# Patient Record
Sex: Female | Born: 1950
Health system: Southern US, Community
[De-identification: ages and names within clinical notes are randomized; demographics above are authoritative.]

## PROBLEM LIST (undated history)

## (undated) DIAGNOSIS — E785 Hyperlipidemia, unspecified: Secondary | ICD-10-CM

## (undated) DIAGNOSIS — I10 Essential (primary) hypertension: Secondary | ICD-10-CM

## (undated) DIAGNOSIS — R011 Cardiac murmur, unspecified: Secondary | ICD-10-CM

## (undated) DIAGNOSIS — I6330 Cerebral infarction due to thrombosis of unspecified cerebral artery: Secondary | ICD-10-CM

## (undated) DIAGNOSIS — H269 Unspecified cataract: Secondary | ICD-10-CM

## (undated) DIAGNOSIS — S86019A Strain of unspecified Achilles tendon, initial encounter: Secondary | ICD-10-CM

## (undated) DIAGNOSIS — T7840XA Allergy, unspecified, initial encounter: Secondary | ICD-10-CM

## (undated) DIAGNOSIS — M1990 Unspecified osteoarthritis, unspecified site: Secondary | ICD-10-CM

## (undated) HISTORY — DX: Unspecified osteoarthritis, unspecified site: M19.90

## (undated) HISTORY — DX: Allergy, unspecified, initial encounter: T78.40XA

## (undated) HISTORY — DX: Unspecified cataract: H26.9

## (undated) HISTORY — PX: EYE SURGERY: SHX253

## (undated) HISTORY — PX: DILATION AND CURETTAGE OF UTERUS: SHX78

## (undated) HISTORY — DX: Strain of unspecified achilles tendon, initial encounter: S86.019A

## (undated) HISTORY — DX: Essential (primary) hypertension: I10

## (undated) HISTORY — DX: Cerebral infarction due to thrombosis of unspecified cerebral artery: I63.30

## (undated) HISTORY — DX: Cardiac murmur, unspecified: R01.1

## (undated) HISTORY — PX: CATARACT EXTRACTION, BILATERAL: SHX1313

## (undated) HISTORY — DX: Hyperlipidemia, unspecified: E78.5

---

## 1981-03-19 HISTORY — PX: OTHER SURGICAL HISTORY: SHX169

## 2000-03-19 DIAGNOSIS — I6330 Cerebral infarction due to thrombosis of unspecified cerebral artery: Secondary | ICD-10-CM

## 2000-03-19 HISTORY — DX: Cerebral infarction due to thrombosis of unspecified cerebral artery: I63.30

## 2000-10-05 ENCOUNTER — Inpatient Hospital Stay (HOSPITAL_COMMUNITY): Admission: EM | Admit: 2000-10-05 | Discharge: 2000-10-07 | Payer: Self-pay | Admitting: Emergency Medicine

## 2000-10-05 ENCOUNTER — Encounter: Payer: Self-pay | Admitting: Emergency Medicine

## 2000-10-14 ENCOUNTER — Encounter: Admission: RE | Admit: 2000-10-14 | Discharge: 2000-10-16 | Payer: Self-pay | Admitting: Internal Medicine

## 2001-03-19 HISTORY — PX: POLYPECTOMY: SHX149

## 2001-09-23 ENCOUNTER — Other Ambulatory Visit: Admission: RE | Admit: 2001-09-23 | Discharge: 2001-09-23 | Payer: Self-pay | Admitting: *Deleted

## 2002-09-30 ENCOUNTER — Other Ambulatory Visit: Admission: RE | Admit: 2002-09-30 | Discharge: 2002-09-30 | Payer: Self-pay | Admitting: *Deleted

## 2002-10-26 ENCOUNTER — Encounter: Admission: RE | Admit: 2002-10-26 | Discharge: 2002-10-26 | Payer: Self-pay | Admitting: Internal Medicine

## 2002-10-26 ENCOUNTER — Encounter: Payer: Self-pay | Admitting: Internal Medicine

## 2002-12-07 ENCOUNTER — Other Ambulatory Visit: Admission: RE | Admit: 2002-12-07 | Discharge: 2002-12-07 | Payer: Self-pay | Admitting: *Deleted

## 2003-05-03 ENCOUNTER — Other Ambulatory Visit: Admission: RE | Admit: 2003-05-03 | Discharge: 2003-05-03 | Payer: Self-pay | Admitting: *Deleted

## 2003-07-26 ENCOUNTER — Other Ambulatory Visit: Admission: RE | Admit: 2003-07-26 | Discharge: 2003-07-26 | Payer: Self-pay | Admitting: *Deleted

## 2003-08-05 ENCOUNTER — Encounter: Payer: Self-pay | Admitting: Internal Medicine

## 2003-09-20 ENCOUNTER — Encounter (INDEPENDENT_AMBULATORY_CARE_PROVIDER_SITE_OTHER): Payer: Self-pay | Admitting: *Deleted

## 2003-09-20 LAB — CONVERTED CEMR LAB

## 2003-09-22 ENCOUNTER — Other Ambulatory Visit: Admission: RE | Admit: 2003-09-22 | Discharge: 2003-09-22 | Payer: Self-pay | Admitting: *Deleted

## 2004-01-28 ENCOUNTER — Ambulatory Visit: Payer: Self-pay | Admitting: Internal Medicine

## 2004-04-06 ENCOUNTER — Other Ambulatory Visit: Admission: RE | Admit: 2004-04-06 | Discharge: 2004-04-06 | Payer: Self-pay | Admitting: *Deleted

## 2004-06-19 ENCOUNTER — Ambulatory Visit: Payer: Self-pay | Admitting: Internal Medicine

## 2004-07-01 ENCOUNTER — Ambulatory Visit: Payer: Self-pay | Admitting: Family Medicine

## 2004-09-21 ENCOUNTER — Other Ambulatory Visit: Admission: RE | Admit: 2004-09-21 | Discharge: 2004-09-21 | Payer: Self-pay | Admitting: *Deleted

## 2004-09-26 ENCOUNTER — Ambulatory Visit: Payer: Self-pay | Admitting: Internal Medicine

## 2004-10-02 ENCOUNTER — Ambulatory Visit: Payer: Self-pay | Admitting: Internal Medicine

## 2004-11-09 ENCOUNTER — Ambulatory Visit: Payer: Self-pay | Admitting: Internal Medicine

## 2005-01-08 ENCOUNTER — Ambulatory Visit: Payer: Self-pay | Admitting: Internal Medicine

## 2005-01-22 ENCOUNTER — Other Ambulatory Visit: Admission: RE | Admit: 2005-01-22 | Discharge: 2005-01-22 | Payer: Self-pay | Admitting: *Deleted

## 2005-01-30 ENCOUNTER — Ambulatory Visit: Payer: Self-pay | Admitting: Internal Medicine

## 2005-02-12 ENCOUNTER — Ambulatory Visit: Payer: Self-pay | Admitting: Internal Medicine

## 2005-06-04 ENCOUNTER — Ambulatory Visit: Payer: Self-pay | Admitting: Internal Medicine

## 2005-06-13 ENCOUNTER — Ambulatory Visit: Payer: Self-pay | Admitting: Internal Medicine

## 2005-08-02 ENCOUNTER — Ambulatory Visit: Payer: Self-pay | Admitting: Internal Medicine

## 2005-09-24 ENCOUNTER — Other Ambulatory Visit: Admission: RE | Admit: 2005-09-24 | Discharge: 2005-09-24 | Payer: Self-pay | Admitting: *Deleted

## 2005-10-22 ENCOUNTER — Ambulatory Visit: Payer: Self-pay | Admitting: Family Medicine

## 2005-11-06 ENCOUNTER — Ambulatory Visit: Payer: Self-pay | Admitting: Internal Medicine

## 2005-12-11 ENCOUNTER — Ambulatory Visit: Payer: Self-pay | Admitting: Internal Medicine

## 2006-03-19 HISTORY — PX: KNEE ARTHROSCOPY W/ MENISCAL REPAIR: SHX1877

## 2006-03-19 HISTORY — PX: COLONOSCOPY: SHX174

## 2006-03-29 ENCOUNTER — Ambulatory Visit: Payer: Self-pay | Admitting: Internal Medicine

## 2006-03-29 LAB — CONVERTED CEMR LAB
ALT: 29 units/L (ref 0–40)
AST: 24 units/L (ref 0–37)
Albumin: 4 g/dL (ref 3.5–5.2)
Alkaline Phosphatase: 54 units/L (ref 39–117)
BUN: 29 mg/dL — ABNORMAL HIGH (ref 6–23)
Basophils Absolute: 0 10*3/uL (ref 0.0–0.1)
Basophils Relative: 0.8 % (ref 0.0–1.0)
CO2: 30 meq/L (ref 19–32)
Calcium: 9.5 mg/dL (ref 8.4–10.5)
Chloride: 104 meq/L (ref 96–112)
Cholesterol: 183 mg/dL (ref 0–200)
Creatinine, Ser: 1.1 mg/dL (ref 0.4–1.2)
Eosinophils Relative: 4.4 % (ref 0.0–5.0)
GFR calc Af Amer: 66 mL/min
GFR calc non Af Amer: 55 mL/min
Glucose, Bld: 146 mg/dL — ABNORMAL HIGH (ref 70–99)
HCT: 41.6 % (ref 36.0–46.0)
HDL: 84.3 mg/dL (ref >39.0)
Hemoglobin: 14 g/dL (ref 12.0–15.0)
Hgb A1c MFr Bld: 5.9 % (ref 4.6–6.0)
LDL Cholesterol: 90 mg/dL (ref 0–99)
Lymphocytes Relative: 21.8 % (ref 12.0–46.0)
MCHC: 33.6 g/dL (ref 30.0–36.0)
MCV: 93.9 fL (ref 78.0–100.0)
Monocytes Absolute: 0.3 10*3/uL (ref 0.2–0.7)
Monocytes Relative: 6.6 % (ref 3.0–11.0)
Neutro Abs: 2.9 10*3/uL (ref 1.4–7.7)
Neutrophils Relative %: 66.4 % (ref 43.0–77.0)
Platelets: 236 10*3/uL (ref 150–400)
Potassium: 4 meq/L (ref 3.5–5.1)
RBC: 4.43 M/uL (ref 3.87–5.11)
RDW: 11.8 % (ref 11.5–14.6)
Sodium: 140 meq/L (ref 135–145)
TSH: 1.26 microintl units/mL (ref 0.35–5.50)
Total Bilirubin: 0.8 mg/dL (ref 0.3–1.2)
Total CHOL/HDL Ratio: 2.2
Total Protein: 6.8 g/dL (ref 6.0–8.3)
Triglycerides: 43 mg/dL (ref 0–149)
VLDL: 9 mg/dL (ref 0–40)
Vit D, 1,25-Dihydroxy: 36 (ref 20–57)
WBC: 4.4 10*3/uL — ABNORMAL LOW (ref 4.5–10.5)

## 2006-04-10 ENCOUNTER — Ambulatory Visit: Payer: Self-pay | Admitting: Internal Medicine

## 2006-04-10 LAB — CONVERTED CEMR LAB: Microalb, Ur: 0.64 mg/dL (ref 0.00–1.89)

## 2006-04-26 ENCOUNTER — Ambulatory Visit: Payer: Self-pay | Admitting: Internal Medicine

## 2006-05-13 ENCOUNTER — Ambulatory Visit: Payer: Self-pay | Admitting: Internal Medicine

## 2006-07-05 ENCOUNTER — Ambulatory Visit: Payer: Self-pay | Admitting: Internal Medicine

## 2006-07-18 ENCOUNTER — Ambulatory Visit: Payer: Self-pay | Admitting: Internal Medicine

## 2006-07-18 LAB — CONVERTED CEMR LAB: Hgb A1c MFr Bld: 5.7 % (ref 4.6–6.0)

## 2006-10-01 ENCOUNTER — Other Ambulatory Visit: Admission: RE | Admit: 2006-10-01 | Discharge: 2006-10-01 | Payer: Self-pay | Admitting: *Deleted

## 2006-10-17 ENCOUNTER — Telehealth: Payer: Self-pay | Admitting: Internal Medicine

## 2006-10-18 ENCOUNTER — Ambulatory Visit: Payer: Self-pay | Admitting: Internal Medicine

## 2006-10-18 LAB — CONVERTED CEMR LAB: Hgb A1c MFr Bld: 5.9 % (ref 4.6–6.0)

## 2006-12-12 ENCOUNTER — Ambulatory Visit: Payer: Self-pay | Admitting: Internal Medicine

## 2006-12-27 ENCOUNTER — Ambulatory Visit: Payer: Self-pay | Admitting: Internal Medicine

## 2007-01-14 ENCOUNTER — Telehealth (INDEPENDENT_AMBULATORY_CARE_PROVIDER_SITE_OTHER): Payer: Self-pay | Admitting: *Deleted

## 2007-01-15 ENCOUNTER — Ambulatory Visit: Payer: Self-pay | Admitting: Internal Medicine

## 2007-01-23 ENCOUNTER — Telehealth (INDEPENDENT_AMBULATORY_CARE_PROVIDER_SITE_OTHER): Payer: Self-pay | Admitting: *Deleted

## 2007-01-23 DIAGNOSIS — E1139 Type 2 diabetes mellitus with other diabetic ophthalmic complication: Secondary | ICD-10-CM | POA: Insufficient documentation

## 2007-01-23 DIAGNOSIS — E11319 Type 2 diabetes mellitus with unspecified diabetic retinopathy without macular edema: Secondary | ICD-10-CM

## 2007-01-27 ENCOUNTER — Encounter: Payer: Self-pay | Admitting: Internal Medicine

## 2007-01-30 ENCOUNTER — Ambulatory Visit: Payer: Self-pay | Admitting: Internal Medicine

## 2007-01-30 LAB — CONVERTED CEMR LAB: Hgb A1c MFr Bld: 5.8 % (ref 4.6–6.0)

## 2007-02-25 ENCOUNTER — Telehealth (INDEPENDENT_AMBULATORY_CARE_PROVIDER_SITE_OTHER): Payer: Self-pay | Admitting: *Deleted

## 2007-03-21 ENCOUNTER — Telehealth (INDEPENDENT_AMBULATORY_CARE_PROVIDER_SITE_OTHER): Payer: Self-pay | Admitting: *Deleted

## 2007-04-17 ENCOUNTER — Telehealth (INDEPENDENT_AMBULATORY_CARE_PROVIDER_SITE_OTHER): Payer: Self-pay | Admitting: *Deleted

## 2007-04-21 ENCOUNTER — Encounter (INDEPENDENT_AMBULATORY_CARE_PROVIDER_SITE_OTHER): Payer: Self-pay | Admitting: *Deleted

## 2007-04-21 DIAGNOSIS — Z8742 Personal history of other diseases of the female genital tract: Secondary | ICD-10-CM | POA: Insufficient documentation

## 2007-04-21 DIAGNOSIS — I10 Essential (primary) hypertension: Secondary | ICD-10-CM

## 2007-04-23 ENCOUNTER — Telehealth (INDEPENDENT_AMBULATORY_CARE_PROVIDER_SITE_OTHER): Payer: Self-pay | Admitting: *Deleted

## 2007-05-05 ENCOUNTER — Telehealth (INDEPENDENT_AMBULATORY_CARE_PROVIDER_SITE_OTHER): Payer: Self-pay | Admitting: *Deleted

## 2007-05-05 ENCOUNTER — Telehealth: Payer: Self-pay | Admitting: Internal Medicine

## 2007-05-06 ENCOUNTER — Ambulatory Visit: Payer: Self-pay | Admitting: Internal Medicine

## 2007-05-12 LAB — CONVERTED CEMR LAB
Microalb Creat Ratio: 3.4 mg/g (ref 0.0–30.0)
Microalb, Ur: 0.6 mg/dL (ref 0.0–1.9)

## 2007-05-13 ENCOUNTER — Encounter (INDEPENDENT_AMBULATORY_CARE_PROVIDER_SITE_OTHER): Payer: Self-pay | Admitting: *Deleted

## 2007-05-16 ENCOUNTER — Ambulatory Visit: Payer: Self-pay | Admitting: Internal Medicine

## 2007-05-16 ENCOUNTER — Encounter: Payer: Self-pay | Admitting: Internal Medicine

## 2007-05-16 DIAGNOSIS — M7120 Synovial cyst of popliteal space [Baker], unspecified knee: Secondary | ICD-10-CM | POA: Insufficient documentation

## 2007-05-16 DIAGNOSIS — R011 Cardiac murmur, unspecified: Secondary | ICD-10-CM

## 2007-05-16 LAB — CONVERTED CEMR LAB
Basophils Relative: 1 % (ref 0.0–1.0)
CO2: 28 meq/L (ref 19–32)
Eosinophils Relative: 11.5 % — ABNORMAL HIGH (ref 0.0–5.0)
GFR calc Af Amer: 83 mL/min
Glucose, Bld: 116 mg/dL — ABNORMAL HIGH (ref 70–99)
Hemoglobin: 13.3 g/dL (ref 12.0–15.0)
Lymphocytes Relative: 26.8 % (ref 12.0–46.0)
Monocytes Absolute: 0.4 10*3/uL (ref 0.2–0.7)
Neutro Abs: 3 10*3/uL (ref 1.4–7.7)
Neutrophils Relative %: 53.4 % (ref 43.0–77.0)
Potassium: 4.1 meq/L (ref 3.5–5.1)
Sodium: 139 meq/L (ref 135–145)
VLDL: 6 mg/dL (ref 0–40)
WBC: 5.7 10*3/uL (ref 4.5–10.5)

## 2007-06-18 ENCOUNTER — Telehealth (INDEPENDENT_AMBULATORY_CARE_PROVIDER_SITE_OTHER): Payer: Self-pay | Admitting: *Deleted

## 2007-07-10 ENCOUNTER — Encounter: Payer: Self-pay | Admitting: Internal Medicine

## 2007-07-14 ENCOUNTER — Telehealth (INDEPENDENT_AMBULATORY_CARE_PROVIDER_SITE_OTHER): Payer: Self-pay | Admitting: *Deleted

## 2007-08-08 ENCOUNTER — Ambulatory Visit: Payer: Self-pay | Admitting: Internal Medicine

## 2007-09-03 ENCOUNTER — Telehealth (INDEPENDENT_AMBULATORY_CARE_PROVIDER_SITE_OTHER): Payer: Self-pay | Admitting: *Deleted

## 2007-09-24 ENCOUNTER — Ambulatory Visit: Payer: Self-pay | Admitting: Internal Medicine

## 2007-09-24 ENCOUNTER — Telehealth (INDEPENDENT_AMBULATORY_CARE_PROVIDER_SITE_OTHER): Payer: Self-pay | Admitting: *Deleted

## 2007-10-06 ENCOUNTER — Ambulatory Visit: Payer: Self-pay | Admitting: Internal Medicine

## 2007-10-06 DIAGNOSIS — L659 Nonscarring hair loss, unspecified: Secondary | ICD-10-CM | POA: Insufficient documentation

## 2007-10-08 ENCOUNTER — Ambulatory Visit: Payer: Self-pay | Admitting: Internal Medicine

## 2007-10-13 ENCOUNTER — Telehealth (INDEPENDENT_AMBULATORY_CARE_PROVIDER_SITE_OTHER): Payer: Self-pay | Admitting: *Deleted

## 2007-10-14 LAB — CONVERTED CEMR LAB
BUN: 29 mg/dL — ABNORMAL HIGH (ref 6–23)
TSH: 1.67 microintl units/mL (ref 0.35–5.50)

## 2007-10-15 ENCOUNTER — Other Ambulatory Visit: Admission: RE | Admit: 2007-10-15 | Discharge: 2007-10-15 | Payer: Self-pay | Admitting: Obstetrics and Gynecology

## 2007-10-22 ENCOUNTER — Telehealth (INDEPENDENT_AMBULATORY_CARE_PROVIDER_SITE_OTHER): Payer: Self-pay | Admitting: *Deleted

## 2007-12-11 ENCOUNTER — Encounter: Payer: Self-pay | Admitting: Internal Medicine

## 2007-12-15 ENCOUNTER — Ambulatory Visit: Payer: Self-pay | Admitting: Family Medicine

## 2007-12-15 ENCOUNTER — Encounter: Payer: Self-pay | Admitting: Internal Medicine

## 2007-12-17 ENCOUNTER — Ambulatory Visit: Payer: Self-pay | Admitting: Internal Medicine

## 2007-12-22 ENCOUNTER — Encounter: Payer: Self-pay | Admitting: Internal Medicine

## 2008-01-07 ENCOUNTER — Telehealth (INDEPENDENT_AMBULATORY_CARE_PROVIDER_SITE_OTHER): Payer: Self-pay | Admitting: *Deleted

## 2008-02-04 ENCOUNTER — Telehealth (INDEPENDENT_AMBULATORY_CARE_PROVIDER_SITE_OTHER): Payer: Self-pay | Admitting: *Deleted

## 2008-02-05 ENCOUNTER — Ambulatory Visit: Payer: Self-pay | Admitting: Internal Medicine

## 2008-02-05 ENCOUNTER — Telehealth (INDEPENDENT_AMBULATORY_CARE_PROVIDER_SITE_OTHER): Payer: Self-pay | Admitting: *Deleted

## 2008-02-08 LAB — CONVERTED CEMR LAB: Hgb A1c MFr Bld: 6.1 % — ABNORMAL HIGH (ref 4.6–6.0)

## 2008-02-09 ENCOUNTER — Telehealth (INDEPENDENT_AMBULATORY_CARE_PROVIDER_SITE_OTHER): Payer: Self-pay | Admitting: *Deleted

## 2008-02-09 ENCOUNTER — Encounter (INDEPENDENT_AMBULATORY_CARE_PROVIDER_SITE_OTHER): Payer: Self-pay | Admitting: *Deleted

## 2008-03-18 ENCOUNTER — Telehealth (INDEPENDENT_AMBULATORY_CARE_PROVIDER_SITE_OTHER): Payer: Self-pay | Admitting: *Deleted

## 2008-03-23 ENCOUNTER — Encounter (INDEPENDENT_AMBULATORY_CARE_PROVIDER_SITE_OTHER): Payer: Self-pay | Admitting: *Deleted

## 2008-04-01 ENCOUNTER — Telehealth (INDEPENDENT_AMBULATORY_CARE_PROVIDER_SITE_OTHER): Payer: Self-pay | Admitting: *Deleted

## 2008-04-15 ENCOUNTER — Telehealth (INDEPENDENT_AMBULATORY_CARE_PROVIDER_SITE_OTHER): Payer: Self-pay | Admitting: *Deleted

## 2008-05-05 ENCOUNTER — Telehealth (INDEPENDENT_AMBULATORY_CARE_PROVIDER_SITE_OTHER): Payer: Self-pay | Admitting: *Deleted

## 2008-05-17 ENCOUNTER — Telehealth (INDEPENDENT_AMBULATORY_CARE_PROVIDER_SITE_OTHER): Payer: Self-pay | Admitting: *Deleted

## 2008-05-18 ENCOUNTER — Ambulatory Visit: Payer: Self-pay | Admitting: Internal Medicine

## 2008-05-18 LAB — CONVERTED CEMR LAB
AST: 25 units/L (ref 0–37)
Alkaline Phosphatase: 63 units/L (ref 39–117)
Bilirubin Urine: NEGATIVE
Bilirubin, Direct: 0.1 mg/dL (ref 0.0–0.3)
Creatinine, Ser: 1 mg/dL (ref 0.4–1.2)
HDL: 63.5 mg/dL (ref >39.0)
Mucus, UA: NEGATIVE
Nitrite: NEGATIVE
Squamous Epithelial / LPF: NEGATIVE /lpf
Total Bilirubin: 0.7 mg/dL (ref 0.3–1.2)
Total Protein, Urine: NEGATIVE mg/dL
pH: 6 (ref 5.0–8.0)

## 2008-05-19 ENCOUNTER — Telehealth (INDEPENDENT_AMBULATORY_CARE_PROVIDER_SITE_OTHER): Payer: Self-pay | Admitting: *Deleted

## 2008-05-20 ENCOUNTER — Ambulatory Visit: Payer: Self-pay | Admitting: Internal Medicine

## 2008-05-20 LAB — CONVERTED CEMR LAB
Cholesterol, target level: 200 mg/dL
HDL goal, serum: 50 mg/dL
LDL Goal: 140 mg/dL

## 2008-06-10 ENCOUNTER — Telehealth: Payer: Self-pay | Admitting: Internal Medicine

## 2008-08-30 ENCOUNTER — Ambulatory Visit: Payer: Self-pay | Admitting: Internal Medicine

## 2008-08-30 LAB — CONVERTED CEMR LAB: Hgb A1c MFr Bld: 6.1 % (ref 4.6–6.5)

## 2008-10-15 ENCOUNTER — Other Ambulatory Visit: Admission: RE | Admit: 2008-10-15 | Discharge: 2008-10-15 | Payer: Self-pay | Admitting: Obstetrics and Gynecology

## 2008-10-15 ENCOUNTER — Ambulatory Visit: Payer: Self-pay | Admitting: Obstetrics and Gynecology

## 2008-10-15 ENCOUNTER — Encounter: Payer: Self-pay | Admitting: Obstetrics and Gynecology

## 2008-11-04 ENCOUNTER — Encounter: Payer: Self-pay | Admitting: Internal Medicine

## 2008-11-15 ENCOUNTER — Telehealth (INDEPENDENT_AMBULATORY_CARE_PROVIDER_SITE_OTHER): Payer: Self-pay | Admitting: *Deleted

## 2008-12-22 ENCOUNTER — Encounter: Payer: Self-pay | Admitting: Family Medicine

## 2008-12-22 ENCOUNTER — Encounter: Admission: RE | Admit: 2008-12-22 | Discharge: 2008-12-22 | Payer: Self-pay | Admitting: Internal Medicine

## 2008-12-22 ENCOUNTER — Ambulatory Visit: Payer: Self-pay | Admitting: Internal Medicine

## 2008-12-23 ENCOUNTER — Ambulatory Visit: Payer: Self-pay | Admitting: Internal Medicine

## 2008-12-23 LAB — CONVERTED CEMR LAB
ALT: 32 units/L (ref 0–35)
AST: 26 units/L (ref 0–37)

## 2009-03-15 ENCOUNTER — Telehealth (INDEPENDENT_AMBULATORY_CARE_PROVIDER_SITE_OTHER): Payer: Self-pay | Admitting: *Deleted

## 2009-03-31 ENCOUNTER — Encounter: Payer: Self-pay | Admitting: Internal Medicine

## 2009-04-29 ENCOUNTER — Telehealth (INDEPENDENT_AMBULATORY_CARE_PROVIDER_SITE_OTHER): Payer: Self-pay | Admitting: *Deleted

## 2009-06-03 ENCOUNTER — Telehealth (INDEPENDENT_AMBULATORY_CARE_PROVIDER_SITE_OTHER): Payer: Self-pay | Admitting: *Deleted

## 2009-06-07 ENCOUNTER — Telehealth (INDEPENDENT_AMBULATORY_CARE_PROVIDER_SITE_OTHER): Payer: Self-pay | Admitting: *Deleted

## 2009-07-12 ENCOUNTER — Telehealth (INDEPENDENT_AMBULATORY_CARE_PROVIDER_SITE_OTHER): Payer: Self-pay | Admitting: *Deleted

## 2009-08-05 ENCOUNTER — Telehealth (INDEPENDENT_AMBULATORY_CARE_PROVIDER_SITE_OTHER): Payer: Self-pay | Admitting: *Deleted

## 2009-08-08 ENCOUNTER — Ambulatory Visit: Payer: Self-pay | Admitting: Internal Medicine

## 2009-08-13 LAB — CONVERTED CEMR LAB
ALT: 28 units/L (ref 0–35)
AST: 20 units/L (ref 0–37)
Albumin: 4.1 g/dL (ref 3.5–5.2)
Alkaline Phosphatase: 60 units/L (ref 39–117)
Cholesterol: 211 mg/dL — ABNORMAL HIGH (ref 0–200)
Creatinine, Ser: 0.9 mg/dL (ref 0.4–1.2)
Triglycerides: 38 mg/dL (ref 0.0–149.0)

## 2009-08-16 ENCOUNTER — Ambulatory Visit: Payer: Self-pay | Admitting: Internal Medicine

## 2009-08-16 DIAGNOSIS — N959 Unspecified menopausal and perimenopausal disorder: Secondary | ICD-10-CM | POA: Insufficient documentation

## 2009-08-16 DIAGNOSIS — E1159 Type 2 diabetes mellitus with other circulatory complications: Secondary | ICD-10-CM | POA: Insufficient documentation

## 2009-08-16 DIAGNOSIS — E1169 Type 2 diabetes mellitus with other specified complication: Secondary | ICD-10-CM | POA: Insufficient documentation

## 2009-08-16 DIAGNOSIS — I10 Essential (primary) hypertension: Secondary | ICD-10-CM | POA: Insufficient documentation

## 2009-08-16 DIAGNOSIS — E785 Hyperlipidemia, unspecified: Secondary | ICD-10-CM

## 2009-08-16 DIAGNOSIS — Z86010 Personal history of colonic polyps: Secondary | ICD-10-CM

## 2009-08-16 LAB — CONVERTED CEMR LAB
Creatinine,U: 41.7 mg/dL
Microalb Creat Ratio: 0.7 mg/g (ref 0.0–30.0)
Vit D, 25-Hydroxy: 56 ng/mL (ref 30–89)

## 2009-09-01 ENCOUNTER — Encounter: Payer: Self-pay | Admitting: Internal Medicine

## 2009-09-08 ENCOUNTER — Telehealth (INDEPENDENT_AMBULATORY_CARE_PROVIDER_SITE_OTHER): Payer: Self-pay | Admitting: *Deleted

## 2009-10-19 ENCOUNTER — Ambulatory Visit: Payer: Self-pay | Admitting: Obstetrics and Gynecology

## 2009-10-19 ENCOUNTER — Other Ambulatory Visit: Admission: RE | Admit: 2009-10-19 | Discharge: 2009-10-19 | Payer: Self-pay | Admitting: Obstetrics and Gynecology

## 2009-11-09 ENCOUNTER — Telehealth: Payer: Self-pay | Admitting: Internal Medicine

## 2009-12-07 ENCOUNTER — Ambulatory Visit: Payer: Self-pay | Admitting: Internal Medicine

## 2010-02-13 ENCOUNTER — Telehealth: Payer: Self-pay | Admitting: Internal Medicine

## 2010-02-15 ENCOUNTER — Ambulatory Visit: Payer: Self-pay | Admitting: Internal Medicine

## 2010-02-15 LAB — CONVERTED CEMR LAB
ALT: 27 units/L (ref 0–35)
AST: 20 units/L (ref 0–37)
BUN: 24 mg/dL — ABNORMAL HIGH (ref 6–23)
Cholesterol: 197 mg/dL (ref 0–200)
VLDL: 7.8 mg/dL (ref 0.0–40.0)

## 2010-03-30 ENCOUNTER — Telehealth (INDEPENDENT_AMBULATORY_CARE_PROVIDER_SITE_OTHER): Payer: Self-pay | Admitting: *Deleted

## 2010-04-13 ENCOUNTER — Encounter
Admission: RE | Admit: 2010-04-13 | Discharge: 2010-04-13 | Payer: Self-pay | Source: Home / Self Care | Attending: Internal Medicine | Admitting: Internal Medicine

## 2010-04-13 LAB — HM MAMMOGRAPHY: HM Mammogram: NORMAL

## 2010-04-18 NOTE — Progress Notes (Signed)
Summary: REFILL REQUEST  Phone Note Refill Request Call back at [phone] Message from:  Pharmacy on September 08, 2009 11:19 AM  Refills Requested: Medication #1:  ONETOUCH ULTRA TEST   STRP use as directed   Dosage confirmed as above?Dosage Confirmed   Supply Requested: 1 month   Last Refilled: 08/16/2009  Medication #2:  AMLODIPINE BESYLATE 5 MG TABS 1 once daily   Dosage confirmed as above?Dosage Confirmed   Supply Requested: 3 months   Last Refilled: 06/16/2009  Medication #3:  GLUCOPHAGE 1000 MG TABS 1 by mouth two times a day   Dosage confirmed as above?Dosage Confirmed   Supply Requested: 3 months   Last Refilled: 06/03/2009 [facility]  Next Appointment Scheduled: NONE Initial call taken by: Lavell Islam,  September 08, 2009 11:21 AM    Prescriptions: AMLODIPINE BESYLATE 5 MG TABS (AMLODIPINE BESYLATE) 1 once daily  #90 x 3   Entered by:   Shonna Chock   Authorized by:   Marga Melnick MD   Signed by:   Shonna Chock on 09/08/2009   Method used:   Faxed to ...       [facility] (retail)       [address] [place]       [address]       [place], [place]  [address]       Ph: [phone]       Fax: [phone]   RxID:   [id number] GLUCOPHAGE 1000 MG TABS (METFORMIN HCL) 1 by mouth two times a day  #180 x 3   Entered by:   Shonna Chock   Authorized by:   Marga Melnick MD   Signed by:   Shonna Chock on 09/08/2009   Method used:   Faxed to ...       [facility] (retail)       [address] [place]       [address]       [place], [place]  [address]       Ph: [phone]       Fax: [phone]   RxID:   [id number] ONETOUCH ULTRA TEST   STRP (GLUCOSE BLOOD) use as directed  #100 x 5   Entered by:   Shonna Chock   Authorized by:   Marga Melnick MD   Signed by:   Shonna Chock on 09/08/2009   Method used:   Faxed to ...       [facility] (retail)       [address] [place]       [address]       [place], [place]  [address]       Ph: [phone]       Fax:  [phone]   RxID:   [id number]

## 2010-04-18 NOTE — Progress Notes (Signed)
Summary: Phone-cpx labs  Phone Note Call from Patient   Caller: Patient Summary of Call: Patient has an appt on May 31,2011 for a cpx and labs scheduled on May 23,2011. Need lab orders please. Initial call taken by: Barb Merino,  June 07, 2009 3:48 PM  Follow-up for Phone Call        lipid,hep,a1c,k,bun,creat,250.00,401.9.Marland KitchenFelecia Deloach CMA  June 07, 2009 4:10 PM     Additional Follow-up for Phone Call Additional follow up Details #2::    PATIENT IS SCHEDULED FOR LABS Follow-up by: Barb Merino,  June 07, 2009 4:16 PM

## 2010-04-18 NOTE — Progress Notes (Signed)
Summary: labs  Phone Note Call from Patient Call back at Home Phone [phone]   Summary of Call: Patient left message on triage that MD advised her over the weekend that he wanted to add labs to her existing appt. Patient gave dx codes 995.20 and 250.00.  What labs should be added? Please advise. Initial call taken by: Lucious Groves CMA,  February 13, 2010 2:17 PM  Follow-up for Phone Call        A1c , urine microalbumin, AST,ALT, BUN, creat,K+, lipids (250.00, 995.20,272.4) Follow-up by: Marga Melnick MD,  February 13, 2010 6:05 PM  Additional Follow-up for Phone Call Additional follow up Details #1::        desk top wasnt worked - patient has been to lab - addl order fax to [facility] to  add on -malb will not be done  .Marland KitchenOkey Regal Spring  February 15, 2010 9:31 AM

## 2010-04-18 NOTE — Assessment & Plan Note (Signed)
Summary: FLU SHOT//KN   Nurse Visit   Allergies: 1)  ! * Actos  Orders Added: 1)  Admin 1st Vaccine [90471] 2)  Flu Vaccine 44yrs + [[address]] Flu Vaccine Consent Questions     Do you have a history of severe allergic reactions to this vaccine? no    Any prior history of allergic reactions to egg and/or gelatin? no    Do you have a sensitivity to the preservative Thimersol? no    Do you have a past history of Guillan-Barre Syndrome? no    Do you currently have an acute febrile illness? no    Have you ever had a severe reaction to latex? no    Vaccine information given and explained to patient? yes    Are you currently pregnant? no    Lot Number:[id number]   Exp Date:09/16/2010   Site Given  Right Deltoid IMu Vaccine 56yrs + [id number] .lbflu

## 2010-04-18 NOTE — Progress Notes (Signed)
Summary: Refill Request  Phone Note Refill Request Call back at [phone] Message from:  Pharmacy on Aug 05, 2009 10:26 AM  Refills Requested: Medication #1:  ONETOUCH ULTRA TEST   STRP use as directed   Dosage confirmed as above?Dosage Confirmed   Supply Requested: [facility]  Next Appointment Scheduled: 5.31.11 Initial call taken by: Harold Barban,  Aug 05, 2009 10:27 AM    Prescriptions: [place]n Bound TEST   STRP (GLUCOSE BLOOD) use as directed  #100 x 0   Entered by:   Shonna Chock   Authorized by:   Marga Melnick MD   Signed by:   Shonna Chock on 08/05/2009   Method used:   Faxed to ...       [facility] (retail)       [address] [place]       [address]       [place], [place]  [address]       Ph: [phone]       Fax: [phone]   RxID:   [id number]

## 2010-04-18 NOTE — Progress Notes (Signed)
Summary: refill  Phone Note Refill Request Message from:  Fax from Pharmacy [facility] fax [phone]  Refills Requested: Medication #1:  GLIMEPIRIDE 1 MG  TABS 1/2 tab qd  Medication #2:  LISINOPRIL 40 MG TABS 1 by mouth once daily pt due for office visit Initial call taken by: Barb Merino,  April 29, 2009 2:22 PM    Prescriptions: GLIMEPIRIDE 1 MG  TABS (GLIMEPIRIDE) 1/2 tab qd  #90 x 1   Entered by:   Shonna Chock   Authorized by:   Marga Melnick MD   Signed by:   Shonna Chock on 04/29/2009   Method used:   Faxed to ...       [facility] (retail)       [address] [place]       [address]       [place], [place]  [address]       Ph: [phone]       Fax: [phone]   RxID:   [id number] LISINOPRIL 40 MG TABS (LISINOPRIL) 1 by mouth once daily pt due for office visit  #90 x 2   Entered by:   Shonna Chock   Authorized by:   Marga Melnick MD   Signed by:   Shonna Chock on 04/29/2009   Method used:   Faxed to ...       [facility] (retail)       [address] [place]       [address]       [place], [place]  [address]       Ph: [phone]       Fax: [phone]   RxID:   [id number]

## 2010-04-18 NOTE — Progress Notes (Signed)
Summary: Refill--Januvia  Phone Note Refill Request Call back at [phone] Message from:  Pharmacy on November 09, 2009 10:46 AM  Refills Requested: Medication #1:  JANUVIA 100 MG  TABS 1 by mouth once daily   Dosage confirmed as above?Dosage Confirmed   Supply Requested: 3 months   Last Refilled: 08/01/2009   Notes: WE DO NOT HAVE E-PRESCRIBE [facility]  Next Appointment Scheduled: NONE Initial call taken by: Lavell Islam,  November 09, 2009 10:47 AM    Prescriptions: JANUVIA 100 MG  TABS (SITAGLIPTIN PHOSPHATE) 1 by mouth once daily  #90 x 1   Entered by:   Lucious Groves CMA   Authorized by:   Marga Melnick MD   Signed by:   Lucious Groves CMA on 11/09/2009   Method used:   Faxed to ...       [facility] (retail)       [address] [place]       [address]       [place], [place]  [address]       Ph: [phone]       Fax: [phone]   RxID:   [id number]

## 2010-04-18 NOTE — Progress Notes (Signed)
Summary: refill one touch ultra    Phone Note Refill Request Message from:  Fax from Pharmacy  Refills Requested: Medication #1:  Alexandra Fletcher TEST   STRP use as directed fax from [facility] - phone [phone]  Initial call taken by: Okey Regal Spring,  July 12, 2009 3:29 PM    Prescriptions: Letta Pate ULTRA TEST   STRP (GLUCOSE BLOOD) use as directed  #100 x 0   Entered by:   Jeremy Johann CMA   Authorized by:   Marga Melnick MD   Signed by:   Jeremy Johann CMA on 07/12/2009   Method used:   Faxed to ...       [facility] (retail)       [address] [place]       [address]       [place], [place]  [address]       Ph: [phone]       Fax: [phone]   RxID:   [id number]

## 2010-04-18 NOTE — Progress Notes (Signed)
Summary: REFILL  Phone Note Refill Request Message from:  Fax from Pharmacy on June 03, 2009 4:00 PM  metformin hcl 100mg / [facility] fax [phone]   Method Requested: Fax to Local Pharmacy Next Appointment Scheduled: no appt Initial call taken by: Barb Merino,  June 03, 2009 4:01 PM    Prescriptions: GLUCOPHAGE 1000 MG TABS (METFORMIN HCL) 1 by mouth two times a day  #180 x 0   Entered by:   Shonna Chock   Authorized by:   Marga Melnick MD   Signed by:   Shonna Chock on 06/03/2009   Method used:   Faxed to ...       [facility] (retail)       [address] [place]       [address]       [place], [place]  [address]       Ph: [phone]       Fax: [phone]   RxID:   [id number]

## 2010-04-18 NOTE — Letter (Signed)
Summary: [facility]  [facility]   Imported By: Lanelle Bal 09/30/2009 08:10:12  _____________________________________________________________________  External Attachment:    Type:   Image     Comment:   External Document

## 2010-04-18 NOTE — Medication Information (Signed)
Summary: Possible Nonadherence with Metformin/[facility]  Possible Nonadherence with Metformin/[facility]   Imported By: Lanelle Bal 04/11/2009 09:51:38  _____________________________________________________________________  External Attachment:    Type:   Image     Comment:   External Document

## 2010-04-18 NOTE — Assessment & Plan Note (Signed)
Summary: cpx/kdc   Vital Signs:  Patient profile:   60 year old female Height:      64.25 inches Weight:      177.8 pounds BMI:     30.39 Temp:     98.4 degrees F oral Pulse rate:   114 / minute Resp:     14 per minute BP sitting:   140 / 78  (left arm) Cuff size:   large  Vitals Entered By: Shonna Chock (Aug 16, 2009 9:46 AM)  Comments REVIEWED MED LIST, PATIENT AGREED DOSE AND INSTRUCTION CORRECT    History of Present Illness: Alexandra Fletcher is here for a physical; she is asymptomatic. Labs reviewed  & risks discussed. NMR also reviewed.A1 was 13.2 % in 2002 ; it is now 6.1%. FBS average: 93; 2 hr post meal averages 115, high < 135. No hypoglycemia. BP averages 125/63. Repeat BP 135/78.  Allergies: 1)  ! * Actos  Past History:  Past Medical History: Diabetes mellitus, type II, diagnosed 04/2000: A1c 13.2% Hypertension Cerebral thrombosis with infarction 2002,in context of uncontrolled DM & HTN ( 220/150) @  presentation);no sequellae abnormal pap smear,PMH of,  resolved Cardiac murmur, aortic Grade 1/2-1 Colonic polyp, hx of, 2003 Hyperlipidemia: NMR Lipoprofile 2005: LDL 107(923/ 242)  Past Surgical History: G3 P2 A1 OP Thumb Surgery; OP Arthroscopy  R knee ,2008 Alexandra  Norlene Campbell Colonoscopy:single  polyp  2003;  negative , Alexandra Lina Sar D&C X 2  Family History: Father: CAD ,onset 24, died @  48, DM  Type 2 in his 43s, MM Mother: died  @ 3, hepatitis, ? liver cancer Siblings: brother: DM (11yo) type I, MI (55); Maternal FH CVA,cancer  (cervical),CAD/MI  Social History: Never Smoked Alcohol use-yes, socially Heart healthy diet, < 39 grams of carbs/ meal & 15 grams as snack two times a day  Occupation: Engineer, agricultural Regular exercise-yes: walking 3X/week, tennis 3X/week, biking  Review of Systems General:  Denies fatigue and sleep disorder. Eyes:  Denies blurring, double vision, and vision loss-both eyes. ENT:  Denies difficulty swallowing, hoarseness,  and nasal congestion. CV:  Denies chest pain or discomfort, leg cramps with exertion, lightheadness, near fainting, palpitations, shortness of breath with exertion, swelling of feet, and swelling of hands. Resp:  Denies cough, hypersomnolence, morning headaches, and shortness of breath. GI:  Denies abdominal pain, bloody stools, dark tarry stools, and indigestion. GU:  Denies discharge, dysuria, hematuria, and urinary frequency. MS:  Complains of joint pain; denies joint redness, joint swelling, low back pain, mid back pain, muscle aches, muscle weakness, and thoracic pain; Pain in hands & knees ; no Rx. Derm:  Denies changes in nail beds, dryness, hair loss, lesion(s), and rash. Neuro:  Denies numbness and tingling; No burning. Psych:  Denies anxiety and depression. Endo:  Denies cold intolerance, excessive hunger, excessive thirst, excessive urination, heat intolerance, and polyuria; Weight up > 10# due to decreased swimming. Heme:  Denies abnormal bruising and bleeding. Allergy:  Denies itching eyes and sneezing.  Physical Exam  General:  well-nourished; alert,appropriate and cooperative throughout examination Head:  Normocephalic and atraumatic without obvious abnormalities. No apparent alopecia or balding. Eyes:  No corneal or conjunctival inflammation noted. Perrla. Funduscopic exam benign, without hemorrhages, exudates or papilledema.  Ears:  External ear exam shows no significant lesions or deformities.  Otoscopic examination reveals clear canals, tympanic membranes are intact bilaterally without bulging, retraction, inflammation or discharge. Hearing is grossly normal bilaterally. Nose:  External nasal examination shows no deformity or  inflammation. Nasal mucosa are pink and moist without lesions or exudates. Mouth:  Oral mucosa and oropharynx without lesions or exudates.  Teeth in good repair. Neck:  No deformities, masses, or tenderness noted. Lungs:  Normal respiratory effort, chest  expands symmetrically. Lungs are clear to auscultation, no crackles or wheezes. Heart:  normal rate, regular rhythm, no gallop, no rub, no JVD, no HJR, and grade 1/2-1  /6 systolic murmur @ R base.   Abdomen:  Bowel sounds positive,abdomen soft and non-tender without masses, organomegaly or hernias noted. Genitalia:  Alexandra Fletcher Msk:  No deformity or scoliosis noted of thoracic or lumbar spine.   Pulses:  R and L carotid,radial,dorsalis pedis and posterior tibial pulses are full and equal bilaterally Extremities:  No clubbing, cyanosis, edema, or deformity noted with normal full range of motion of all joints.   Crepitus of knees, R> L Neurologic:  alert & oriented X3, sensation intact to light touch, and DTRs symmetrical and normal.   Skin:  Intact without suspicious lesions or rashes Cervical Nodes:  No lymphadenopathy noted Axillary Nodes:  No palpable lymphadenopathy Psych:  memory intact for recent and remote, normally interactive, good eye contact, and not anxious appearing.     Impression & Recommendations:  Problem # 1:  ROUTINE GENERAL MEDICAL EXAM@HEALTH  CARE FACL (ICD-V70.0)  Orders: TLB-Microalbumin/Creat Ratio, Urine (82043-MALB) T-Vitamin D (25-Hydroxy) ([address]8469-62952) EKG w/ Interpretation ([address])  Problem # 2:  DIABETES MELLITUS, CONTROLLED, WITHOUT COMPLICATIONS (ICD-250.00)  Her updated medication list for this problem includes:    Glucophage 1000 Mg Tabs (Metformin hcl) .Marland Kitchen... 1 by mouth two times a day    Lisinopril 40 Mg Tabs (Lisinopril) .Marland Kitchen... 1 by mouth once daily pt due for office visit    Glimepiride 1 Mg Tabs (Glimepiride) .Marland Kitchen... 1/2 tab qd    Baby Aspirin 81 Mg Chew (Aspirin) .Marland Kitchen... Take 1 tablet by mouth once a day    Januvia 100 Mg Tabs (Sitagliptin phosphate) .Marland Kitchen... 1 by mouth once daily  Orders: TLB-Microalbumin/Creat Ratio, Urine (82043-MALB)  Problem # 3:  HYPERTENSION (ICD-401.9)  Her updated medication list for this problem includes:    Lisinopril 40  Mg Tabs (Lisinopril) .Marland Kitchen... 1 by mouth once daily pt due for office visit    Amlodipine Besylate 5 Mg Tabs (Amlodipine besylate) .Marland Kitchen... 1 once daily  Orders: EKG w/ Interpretation ([address])  Problem # 4:  HYPERLIPIDEMIA (ICD-272.4) see NMR  Problem # 5:  HEART MURMUR, BENIGN (ICD-785.2)  Aortic murmur  Orders: EKG w/ Interpretation ([address])  Problem # 6:  COLONIC POLYPS, HX OF (ICD-V12.72) as per GI  Problem # 7:  POSTMENOPAUSAL SYNDROME (ICD-627.9)  Orders: T-Vitamin D (25-Hydroxy) ([address]-44010)  Complete Medication List: 1)  Onetouch Ultra Test Strp (Glucose blood) .... Use as directed 2)  Glucophage 1000 Mg Tabs (Metformin hcl) .Marland Kitchen.. 1 by mouth two times a day 3)  Lisinopril 40 Mg Tabs (Lisinopril) .Marland Kitchen.. 1 by mouth once daily pt due for office visit 4)  Glimepiride 1 Mg Tabs (Glimepiride) .... 1/2 tab qd 5)  Baby Aspirin 81 Mg Chew (Aspirin) .... Take 1 tablet by mouth once a day 6)  Fish Oil 1000 Mg Caps (Omega-3 fatty acids) .Marland Kitchen.. 1 by mouth once daily 7)  Multivitamins Tabs (Multiple vitamin) .... Take 1 tablet by mouth once a day 8)  Januvia 100 Mg Tabs (Sitagliptin phosphate) .Marland Kitchen.. 1 by mouth once daily 9)  Amlodipine Besylate 5 Mg Tabs (Amlodipine besylate) .Marland Kitchen.. 1 once daily 10)  Vitamin D 1000 Unit Tabs (Cholecalciferol) .Marland KitchenMarland KitchenMarland Kitchen  1 by mouth once daily  Patient Instructions: 1)  SBE prophylaxis pre dental work. 2)  Please schedule a follow-up appointment in 6 months. 3)  See your eye doctor yearly to check for diabetic eye damage. 4)  Check your feet each night for sore areas, calluses or signs of infection. 5)  Lipid Panel prior to visit, ICD-9:272.4 6)  HbgA1C prior to visit, ICD-9:250.00 Prescriptions: AMLODIPINE BESYLATE 5 MG TABS (AMLODIPINE BESYLATE) 1 once daily  #90 x 3   Entered and Authorized by:   Marga Melnick MD   Signed by:   Marga Melnick MD on 08/16/2009   Method used:   Print then Give to Patient   RxID:   [id number] JANUVIA 100 MG  TABS  (SITAGLIPTIN PHOSPHATE) 1 by mouth once daily  #90 x 1   Entered and Authorized by:   Marga Melnick MD   Signed by:   Marga Melnick MD on 08/16/2009   Method used:   Print then Give to Patient   RxID:   [id number] GLIMEPIRIDE 1 MG  TABS (GLIMEPIRIDE) 1/2 tab qd  #90 x 1   Entered and Authorized by:   Marga Melnick MD   Signed by:   Marga Melnick MD on 08/16/2009   Method used:   Print then Give to Patient   RxID:   [id number] LISINOPRIL 40 MG TABS (LISINOPRIL) 1 by mouth once daily pt due for office visit  #90 x 3   Entered and Authorized by:   Marga Melnick MD   Signed by:   Marga Melnick MD on 08/16/2009   Method used:   Print then Give to Patient   RxID:   [id number] GLUCOPHAGE 1000 MG TABS (METFORMIN HCL) 1 by mouth two times a day  #180 x 3   Entered and Authorized by:   Marga Melnick MD   Signed by:   Marga Melnick MD on 08/16/2009   Method used:   Print then Give to Patient   RxID:   [id number] [facility] ULTRA TEST   STRP (GLUCOSE BLOOD) use as directed  #100 x 5   Entered and Authorized by:   Marga Melnick MD   Signed by:   Marga Melnick MD on 08/16/2009   Method used:   Print then Give to Patient   RxID:   [id number]

## 2010-04-20 NOTE — Progress Notes (Signed)
Summary: Refill Request  Phone Note Refill Request Call back at [phone] Message from:  Pharmacy on March 30, 2010 1:24 PM  Refills Requested: Medication #1:  GLIMEPIRIDE 1 MG  TABS 1/2 tab qd   Dosage confirmed as above?Dosage Confirmed   Supply Requested: 90   Last Refilled: 10/10/2009 [facility]  Next Appointment Scheduled: none Initial call taken by: Harold Barban,  March 30, 2010 1:25 PM    Prescriptions: GLIMEPIRIDE 1 MG  TABS (GLIMEPIRIDE) 1/2 tab qd  #90 x 1   Entered by:   Shonna Chock CMA   Authorized by:   Marga Melnick MD   Signed by:   Shonna Chock CMA on 03/30/2010   Method used:   Faxed to ...       [facility] (retail)       [address] [place]       [address]       [place], [place]  [address]       Ph: [phone]       Fax: [phone]   RxID:   [id number]

## 2010-05-26 ENCOUNTER — Telehealth (INDEPENDENT_AMBULATORY_CARE_PROVIDER_SITE_OTHER): Payer: Self-pay | Admitting: *Deleted

## 2010-06-06 NOTE — Progress Notes (Signed)
Summary: lab order   Phone Note Call from Patient   Summary of Call: patient has cpx scheduled [id number] --- lab appt at [facility] [phone] -  need lab order  .Marland KitchenOkey Regal Spring  May 26, 2010 1:47 PM   Follow-up for Phone Call        250.00/272.4/401.9/995.20 Lipid/Hep/BMP/CBCD/TSH/Stool cards Follow-up by: Shonna Chock CMA,  May 26, 2010 1:55 PM  Additional Follow-up for Phone Call Additional follow up Details #1::        lab order added to appt .Marland KitchenOkey Regal Spring  May 29, 2010 1:26 PM

## 2010-08-04 NOTE — Assessment & Plan Note (Signed)
[facility]                                 ON-CALL NOTE   NAME:[name]                      MRN:          [id number]  DATE:08/15/2006                            DOB:          10/13/1950    TIME OF CALL:  6:21pm.   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 She was indoors about 5 hours and  felt that the environment was more chlorine intense than usual and now  has itchy eyes, runny nose, and shortness of breath. She sounds good on  the phone and does not sound short of breath at all and is able to carry  on a conversation easily. My opinion is a probable allergic reaction,  although it could be a chlorine exposure problem which is what she is  concerned about.   ASSESSMENT:  Shortness of breath.   PLAN:  The patient must decide.  If her shortness of breath is  significantly concerning, she should be seen either at an emergency room  or an acute care. I would suggest that no matter what she does, she  tries an over-the-counter non-sedating antihistamine at this point and  if her symptoms not  bad enough to be seen tonight, but persist  tomorrow, call and make an appointment to be seen at the office. If her  symptoms worsen or they concern her, she ought to be seen at either an  acute care or the emergency room.   PRIMARY CARE PHYSICIAN:  Dr. Alwyn Ren   HOME OFFICE:  [facility].     Arta Silence, MD  Electronically Signed    RNS/MedQ  DD: 08/15/2006  DT: 08/16/2006  Job #: [id number]

## 2010-08-21 ENCOUNTER — Ambulatory Visit (INDEPENDENT_AMBULATORY_CARE_PROVIDER_SITE_OTHER): Payer: PRIVATE HEALTH INSURANCE | Admitting: Internal Medicine

## 2010-08-21 ENCOUNTER — Other Ambulatory Visit (INDEPENDENT_AMBULATORY_CARE_PROVIDER_SITE_OTHER): Payer: PRIVATE HEALTH INSURANCE

## 2010-08-21 ENCOUNTER — Other Ambulatory Visit: Payer: Self-pay | Admitting: Internal Medicine

## 2010-08-21 VITALS — Temp 98.3°F | Wt 178.0 lb

## 2010-08-21 DIAGNOSIS — T887XXA Unspecified adverse effect of drug or medicament, initial encounter: Secondary | ICD-10-CM

## 2010-08-21 DIAGNOSIS — E119 Type 2 diabetes mellitus without complications: Secondary | ICD-10-CM

## 2010-08-21 DIAGNOSIS — I10 Essential (primary) hypertension: Secondary | ICD-10-CM

## 2010-08-21 DIAGNOSIS — J029 Acute pharyngitis, unspecified: Secondary | ICD-10-CM

## 2010-08-21 DIAGNOSIS — E785 Hyperlipidemia, unspecified: Secondary | ICD-10-CM

## 2010-08-21 LAB — LIPID PANEL
Cholesterol: 176 mg/dL (ref 0–200)
VLDL: 9.6 mg/dL (ref 0.0–40.0)

## 2010-08-21 LAB — BASIC METABOLIC PANEL
BUN: 25 mg/dL — ABNORMAL HIGH (ref 6–23)
Chloride: 103 mEq/L (ref 96–112)
Creatinine, Ser: 0.8 mg/dL (ref 0.4–1.2)
GFR: 77.6 mL/min (ref >60.00)
Glucose, Bld: 105 mg/dL — ABNORMAL HIGH (ref 70–99)
Potassium: 4.8 mEq/L (ref 3.5–5.1)

## 2010-08-21 LAB — POCT RAPID STREP A (OFFICE): Rapid Strep A Screen: POSITIVE — AB

## 2010-08-21 LAB — CBC WITH DIFFERENTIAL/PLATELET
Basophils Absolute: 0 10*3/uL (ref 0.0–0.1)
HCT: 39.1 % (ref 36.0–46.0)
Lymphs Abs: 1.7 10*3/uL (ref 0.7–4.0)
MCV: 92.3 fl (ref 78.0–100.0)
Monocytes Absolute: 0.4 10*3/uL (ref 0.1–1.0)
Neutrophils Relative %: 57.6 % (ref 43.0–77.0)
Platelets: 196 10*3/uL (ref 150.0–400.0)
RDW: 13.2 % (ref 11.5–14.6)

## 2010-08-21 LAB — HEPATIC FUNCTION PANEL
ALT: 22 U/L (ref 0–35)
Alkaline Phosphatase: 63 U/L (ref 39–117)
Bilirubin, Direct: 0.1 mg/dL (ref 0.0–0.3)
Total Bilirubin: 0.6 mg/dL (ref 0.3–1.2)
Total Protein: 6.2 g/dL (ref 6.0–8.3)

## 2010-08-21 LAB — TSH: TSH: 1.78 u[IU]/mL (ref 0.35–5.50)

## 2010-08-21 MED ORDER — AMOXICILLIN 500 MG PO CAPS: 500.0000 mg | ORAL_CAPSULE | Freq: Three times a day (TID) | ORAL | Status: AC

## 2010-08-24 NOTE — Progress Notes (Signed)
  Subjective:    Patient ID: Alexandra Fletcher, female    DOB: 1951/01/12, 60 y.o.   MRN: [id number]  HPI ST X 48 hrs    Review of Systems     Objective:   Physical Exam no exam; throat swabbed        Assessment & Plan:  + Beta Strep Plan: Amox 500 mg tid #30

## 2010-08-25 LAB — HEMOGLOBIN A1C: Hgb A1c MFr Bld: 6.4 % (ref 4.6–6.5)

## 2010-08-28 ENCOUNTER — Encounter: Payer: Self-pay | Admitting: Internal Medicine

## 2010-08-29 ENCOUNTER — Other Ambulatory Visit: Payer: Self-pay | Admitting: Internal Medicine

## 2010-08-29 MED ORDER — LISINOPRIL 40 MG PO TABS: 40.0000 mg | ORAL_TABLET | Freq: Every day | ORAL | Status: DC

## 2010-08-29 NOTE — Telephone Encounter (Signed)
RX sent to pharmacy  

## 2010-09-18 ENCOUNTER — Encounter: Payer: Self-pay | Admitting: Internal Medicine

## 2010-09-22 ENCOUNTER — Other Ambulatory Visit: Payer: Self-pay | Admitting: Internal Medicine

## 2010-09-22 MED ORDER — AMLODIPINE BESYLATE 5 MG PO TABS: 5.0000 mg | ORAL_TABLET | Freq: Every day | ORAL | Status: DC

## 2010-09-22 MED ORDER — GLUCOSE BLOOD VI STRP: ORAL_STRIP | Status: DC

## 2010-09-22 NOTE — Telephone Encounter (Signed)
RXs sent to the pharmacy.

## 2010-09-26 ENCOUNTER — Encounter: Payer: Self-pay | Admitting: Internal Medicine

## 2010-09-26 ENCOUNTER — Ambulatory Visit (INDEPENDENT_AMBULATORY_CARE_PROVIDER_SITE_OTHER): Payer: PRIVATE HEALTH INSURANCE | Admitting: Internal Medicine

## 2010-09-26 VITALS — BP 124/80 | HR 92 | Temp 98.6°F | Resp 14 | Ht 64.25 in | Wt 176.6 lb

## 2010-09-26 DIAGNOSIS — E785 Hyperlipidemia, unspecified: Secondary | ICD-10-CM

## 2010-09-26 DIAGNOSIS — E119 Type 2 diabetes mellitus without complications: Secondary | ICD-10-CM

## 2010-09-26 DIAGNOSIS — Z0000 Encounter for general adult medical examination without abnormal findings: Secondary | ICD-10-CM

## 2010-09-26 DIAGNOSIS — Z86010 Personal history of colonic polyps: Secondary | ICD-10-CM

## 2010-09-26 DIAGNOSIS — I10 Essential (primary) hypertension: Secondary | ICD-10-CM

## 2010-09-26 DIAGNOSIS — R1310 Dysphagia, unspecified: Secondary | ICD-10-CM

## 2010-09-26 MED ORDER — AMLODIPINE BESYLATE 5 MG PO TABS: 5.0000 mg | ORAL_TABLET | Freq: Every day | ORAL | Status: DC

## 2010-09-26 MED ORDER — LISINOPRIL 40 MG PO TABS: 40.0000 mg | ORAL_TABLET | Freq: Every day | ORAL | Status: DC

## 2010-09-26 MED ORDER — GLIMEPIRIDE 1 MG PO TABS: 1.0000 mg | ORAL_TABLET | ORAL | Status: DC

## 2010-09-26 NOTE — Progress Notes (Signed)
Subjective:    Patient ID: Alexandra Fletcher, female    DOB: 1950-04-18, 60 y.o.   MRN: [id number]  HPI  Junious Dresser  is here for a physical;acute issues include some dysphagia intermittently in past 4 months , especially with chicken , bread & cereal. Her son had Eosinophilic Esophagitis.     Review of Systems Patient reports no   hearing  changes, adenopathy,fever, weight change,  persistant / recurrent hoarseness ,  chest pain,palpitations,edema,persistant /recurrent cough, hemoptysis, dyspnea( rest/ exertional/paroxysmal nocturnal), gastrointestinal bleeding(melena, rectal bleeding), abdominal pain, significant heartburn,  bowel changes,GU symptoms(dysuria, hematuria,pyuria, incontinence) ), Gyn symptoms(abnormal  bleeding , pain),  syncope, focal weakness, memory loss,numbness & tingling, skin /nail changes,abnormal bruising or bleeding, anxiety,or depression. Hair has been thin for years. She  Has had some stiffness in DIP joints of index fingers & L ankle. Diabetes status assessment: Fasting or morning glucose  average :  105  . Highest glucose 2 hours after any meal:  < 120. Hypoglycemia :  rare .                                                     Excess thirst :no;  Excess hunger:  no ;  Excess urination:  no.                                  Lightheadedness with standing:  no. Chest pain:  no ; Palpitations :no ;  Pain in  calves with walking:  no .                                                                                                                                 Non healing skin  ulcers or sores,especially over the feet:  no. Numbness or tingling or burning in feet : no .                                                                                                                                              Significant change in  Weight : stable. Vision changes : no  .  Exercise : 4 hrs / day . Nutrition/diet:   Low carb. Medication compliance : yes. Medication adverse  Effects:  no . Eye exam : 08/2010;  Minimal stable retinopathy; minor cataract OD as per [facility]. Foot care : no.  A1c/ urine microalbumin monitor:  6.4%       Objective:   Physical Exam Gen.: Healthy and well-nourished in appearance. Alert, appropriate and cooperative throughout exam. Head: Normocephalic without obvious abnormalities Eyes: No corneal or conjunctival inflammation noted. Pupils equal round reactive to light and accommodation. Fundal exam is benign without hemorrhages, exudate, papilledema. Extraocular motion intact.  Ears: External  ear exam reveals no significant lesions or deformities. Canals clear .TMs normal. Hearing is grossly normal bilaterally. Nose: External nasal exam reveals no deformity or inflammation. Nasal mucosa are pink and moist. No lesions or exudates noted. Mouth: Oral mucosa and oropharynx reveal no lesions or exudates. Teeth in good repair. Neck: No deformities, masses, or tenderness noted. Range of motion  &  Thyroid normal. Lungs: Normal respiratory effort; chest expands symmetrically. Lungs are clear to auscultation without rales, wheezes, or increased work of breathing. Heart: Normal rate and rhythm. Normal S1 and S2. No gallop, click, or rub. Grade 1/2 - 1 R base systolic murmur. Abdomen: Bowel sounds normal; abdomen soft and nontender. No masses, organomegaly or hernias noted. .                                                                                   Musculoskeletal/extremities: No deformity or scoliosis noted of  the thoracic or lumbar spine. No clubbing, cyanosis, edema. DIP OA finger deformities noted. Range of motion  normal .Tone & strength  normal.Joints normal. Nail health  good. Vascular: Carotid, radial artery, dorsalis pedis and  posterior tibial pulses are full and equal. No bruits present. Neurologic: Alert and oriented x3. Deep tendon reflexes symmetrical and  normal.          Skin: Intact without suspicious lesions or rashes. Lymph: No cervical, axillary, or inguinal lymphadenopathy present. Psych: Mood and affect are normal. Normally interactive                                                                                 I        Assessment & Plan:  #1 comprehensive physical exam; no acute findings #2 see Problem List with Assessments & Recommendations #3 mild intermittent dysphasia in the context of hypereosinophilia and a family history of (son,Chris) eosinophilic esophagitis. Plan: see Orders

## 2010-09-26 NOTE — Patient Instructions (Signed)
Preventive Health Care: Exercise  30-45  minutes a day, 3-4 days a week. Walking is especially valuable in preventing Osteoporosis. Eat a low-fat diet with lots of fruits and vegetables, up to 7-9 servings per day. Eye Doctor - have an eye exam @ least annually

## 2010-09-27 ENCOUNTER — Encounter: Payer: Self-pay | Admitting: Internal Medicine

## 2010-09-28 ENCOUNTER — Encounter: Payer: Self-pay | Admitting: Internal Medicine

## 2010-09-28 ENCOUNTER — Ambulatory Visit (INDEPENDENT_AMBULATORY_CARE_PROVIDER_SITE_OTHER): Payer: PRIVATE HEALTH INSURANCE | Admitting: Internal Medicine

## 2010-09-28 VITALS — BP 150/80 | HR 100 | Ht 64.0 in | Wt 175.0 lb

## 2010-09-28 DIAGNOSIS — R1319 Other dysphagia: Secondary | ICD-10-CM

## 2010-09-28 NOTE — Patient Instructions (Signed)
CC:Dr Marga Melnick

## 2010-09-28 NOTE — Progress Notes (Signed)
Alexandra Fletcher Aug 15, 1950 MRN [id number]      History of Present Illness:  This is a 60 year old white female with solid food dysphagia of several months duration. Her son has eosinophilic esophagitis and has been undergoing esophageal dilatation since the early age of 31. Her eosinophil count was 11%. Her total white blood cell count is 7000. She has not had any food impaction. The episodes occur only about once a week and she does not feel that it is severe enough to have it evaluated. She denies symptoms of gastroesophageal reflux. She is reluctant to take anti-reflux medications. She is a diabetic under good control with her blood sugars. She denies having oral thrush or vaginal yeast infection. She had a screening colonoscopy in 2003 with findings of a rectal polyp.   Past Medical History  Diagnosis Date  . Diabetes mellitus   . Hypertension   . Hyperlipidemia   . Rectal polyp   . Heart murmur   . Cerebral thrombosis with cerebral infarction    Past Surgical History  Procedure Date  . Knee surgery     R knee  . Dilation and curettage of uterus     x2  . Thumb surgery     Left    reports that she has never smoked. She has never used smokeless tobacco. She reports that she drinks alcohol. She reports that she does not use illicit drugs. family history includes Diabetes in her brother and father; Heart attack (age of onset:54) in her brother; Heart disease in her father; and Liver disease in her mother.  There is no history of Colon cancer. Allergies  Allergen Reactions  . Pioglitazone     REACTION: weight gain & ankle edema        Review of Systems: Positive for dysphagia to meats and solids. No dysphagia to liquids. No odynophagia. Denies chest pain or shortness of breath  The remainder of the 10  point ROS is negative except as outlined in H&P   Physical Exam: General appearance  Well developed. in no distress.  Psychological normal mood and affect.  Assessment  and Plan:  Problem #1 Intermittent solid food dysphagia suggestive of an esophageal stricture, Schatzki's ring or esophageal web. Patient prefers to hold off on GI evaluation at this time. She would need an upper endoscopy with possible dilatation. She would also need esophageal biopsies to rule out eosinophilic esophagitis or Barrett's esophagus. She also does not want to take any proton pump inhibitors or acid reducers and wants to see how she does in the next 6 months. I have asked her to call us when she is ready for evaluation of her problem.   09/28/2010 Lina Sar

## 2010-10-10 ENCOUNTER — Ambulatory Visit (INDEPENDENT_AMBULATORY_CARE_PROVIDER_SITE_OTHER): Payer: PRIVATE HEALTH INSURANCE | Admitting: Sports Medicine

## 2010-10-10 ENCOUNTER — Encounter: Payer: Self-pay | Admitting: Sports Medicine

## 2010-10-10 VITALS — BP 132/84 | HR 84 | Ht 64.0 in | Wt 175.0 lb

## 2010-10-10 DIAGNOSIS — M79609 Pain in unspecified limb: Secondary | ICD-10-CM

## 2010-10-10 DIAGNOSIS — M7120 Synovial cyst of popliteal space [Baker], unspecified knee: Secondary | ICD-10-CM

## 2010-10-10 DIAGNOSIS — M79672 Pain in left foot: Secondary | ICD-10-CM

## 2010-10-10 MED ORDER — KETOPROFEN POWD: Status: DC

## 2010-10-10 NOTE — Assessment & Plan Note (Signed)
We used some inserts that she brought in from a shoe store We added a medial heel wedge to both of these We also placed scaphoid pads in her sandals  We want her to continue using good arch support and all of her shoes We also want her to try topical ketoprofen for the midfoot arthritis  If she is not getting significant pain relief with this she should return for consideration of custom orthotics

## 2010-10-10 NOTE — Assessment & Plan Note (Signed)
We suggested she consider using a compression sleeve  She should also be consistent about doing some quadriceps exercise to lessen stress on the knee

## 2010-10-10 NOTE — Progress Notes (Signed)
  Subjective:    Patient ID: Alexandra Fletcher, female    DOB: 1950-10-20, 60 y.o.   MRN: [id number]  HPI  Pt presents to clinic for evaluation of foot pain that she has had for about 2 years. Diagnosed with dorsal bone spur in the middle of lt foot 2 years ago which causes pain that goes up into her ankle  With increased activity.  Bone spur was diagnosed by Dr. Cleophas Dunker after she started having pain in this area.     Also concerned because her rt medial malleolus is much more pronounced than it previously has been.  Does not cause pain, but arch seems to have collapsed per pt.   Had Rt knee scope 2008 done by Dr. Cleophas Dunker for torn meniscus.     Sometimes the right knee feels tight and she is a little less confident with movement when she is playing tennis. The Baker cyst has been swollen and really does not change   Review of Systems     Objective:   Physical Exam     Full extension and flexion of rt knee Baker's cyst on rt that has been there since before her scope in 2008 and is moderate in size Otherwise the exam is stable  Left foot is subluxed into significant pronation There is rear foot valgus Gaining there is collapse of the longitudinal arch There is midfoot bossing between the navicular and the medial cuneiform  Right foot reveals less significant pronation and less loss of the long arch There is no true calcaneal valgus on the right The medial malleolus is very prominent  Her walking gait is pronated but otherwise seems unremarkable   Assessment & Plan:

## 2010-11-23 ENCOUNTER — Other Ambulatory Visit: Payer: Self-pay | Admitting: Internal Medicine

## 2011-01-05 ENCOUNTER — Ambulatory Visit (INDEPENDENT_AMBULATORY_CARE_PROVIDER_SITE_OTHER): Payer: PRIVATE HEALTH INSURANCE | Admitting: *Deleted

## 2011-01-05 DIAGNOSIS — Z2911 Encounter for prophylactic immunotherapy for respiratory syncytial virus (RSV): Secondary | ICD-10-CM

## 2011-01-05 DIAGNOSIS — Z23 Encounter for immunization: Secondary | ICD-10-CM

## 2011-01-31 ENCOUNTER — Ambulatory Visit (INDEPENDENT_AMBULATORY_CARE_PROVIDER_SITE_OTHER): Payer: PRIVATE HEALTH INSURANCE | Admitting: Sports Medicine

## 2011-01-31 ENCOUNTER — Encounter: Payer: Self-pay | Admitting: Sports Medicine

## 2011-01-31 VITALS — Ht 64.0 in | Wt 170.0 lb

## 2011-01-31 DIAGNOSIS — M7120 Synovial cyst of popliteal space [Baker], unspecified knee: Secondary | ICD-10-CM

## 2011-01-31 DIAGNOSIS — M25579 Pain in unspecified ankle and joints of unspecified foot: Secondary | ICD-10-CM

## 2011-01-31 DIAGNOSIS — M7750 Other enthesopathy of unspecified foot: Secondary | ICD-10-CM

## 2011-01-31 NOTE — Assessment & Plan Note (Signed)
Use topical ketoprofen for swelling as needed  Given a series of ankle exercises to emphasize balance  I don't think she needs a brace  She should use arches for better support and heel wedges if needed

## 2011-01-31 NOTE — Patient Instructions (Signed)
Please do suggested exercises for ankles and knees  Continue to use green sports insoles  Please follow up as needed

## 2011-01-31 NOTE — Assessment & Plan Note (Signed)
Her examination me today he is very normal and there is almost no swelling in the Baker's cyst.  While she may have some underlying DJD I think she can continue working with quadriceps exercises and using good shoe support.  If the knee bothers her we will recheck her

## 2011-01-31 NOTE — Progress Notes (Signed)
  Subjective:    Patient ID: Alexandra Fletcher, female    DOB: 05-08-1950, 60 y.o.   MRN: [id number]  HPI  Pt here for f/u of rt baker's cyst and R foot/ankle pain.  At last visit had swelling over R foot at sinus tarsi- using sports insole with medial heel wedge which have been helpful.  Concerned about rt ankle turning inward with walking and running.    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 These activities are not increasing her pain over either her knee or ankle. If she walks in shoes other than the ones that we made some correction on she will get some ankle swelling by the end of the day but it does seem that the topical ketoprofen gel helps lessen this.   Review of Systems     Objective:   Physical Exam  Pronated  Rt foot/ankle  swelling over sinus tarsi- less than last visit   No visible erythema  Range of motion is full in all directions. Strength is 5/5 in all directions. Stable lateral and medial ligaments; squeeze test and kleiger test unremarkable; Talar dome nontender; No pain at base of 5th MT; No tenderness over cuboid; No tenderness over N spot or navicular prominence No tenderness on posterior aspects of lateral and medial malleolus No sign of peroneal tendon subluxations; Negative tarsal tunnel tinel's Able to walk 4 steps.  Rt Knee: Normal to inspection with no erythema or effusion or obvious bony abnormalities. Palpation normal with no warmth or joint line tenderness or patellar tenderness or condyle tenderness. ROM normal in flexion and extension and lower leg rotation. Ligaments with solid consistent endpoints including ACL, PCL, LCL, MCL. Negative Mcmurray's and provocative meniscal tests. Non painful patellar compression. Patellar and quadriceps tendons unremarkable. Hamstring and quadriceps strength is normal. Slight baker's cyst medial side about- 1 cm      Assessment & Plan:

## 2011-02-07 ENCOUNTER — Ambulatory Visit (INDEPENDENT_AMBULATORY_CARE_PROVIDER_SITE_OTHER): Payer: PRIVATE HEALTH INSURANCE | Admitting: Internal Medicine

## 2011-02-07 DIAGNOSIS — L0291 Cutaneous abscess, unspecified: Secondary | ICD-10-CM

## 2011-02-07 DIAGNOSIS — L0390 Cellulitis, unspecified: Secondary | ICD-10-CM

## 2011-02-07 MED ORDER — AMOXICILLIN-POT CLAVULANATE 500-125 MG PO TABS: ORAL_TABLET | ORAL | Status: AC

## 2011-02-07 NOTE — Progress Notes (Signed)
  Subjective:    Patient ID: Alexandra Fletcher, female    DOB: 1950-07-16, 60 y.o.   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  Skin appears healthy except for the localized lesion over the left index finger at the DIP joint. This area is mildly erythematous and tender to touch. The lesion measures 8 x 9 mm. There is a central crust.  She does have classic PIP osteoarthritic osteophytic changes.        Assessment & Plan:  #1 bland  Cellulitis at the site of an osteophyte at the DIP joint of the left index finger. She does teach swimming; I doubt atypical infections such as Mycobacterium marinum. A tophus or ganglion are not suspect  #2 diabetes, excellent control  Plan:  saline soaks 3-4 times a day along with an oral antibiotic. If the lesion progresses to a pustule; culture of lesion  would be recommended.

## 2011-02-07 NOTE — Patient Instructions (Signed)
Soak finger  in  sterile saline 3X a day.  The saline can be purchased at the drugstore or you can make your own .Boil cup of salt in a gallon of water. Store mixture  in a clean container.Report Warning  signs as discussed (red streaks, pus, fever, increasing pain).

## 2011-02-22 ENCOUNTER — Other Ambulatory Visit (INDEPENDENT_AMBULATORY_CARE_PROVIDER_SITE_OTHER): Payer: PRIVATE HEALTH INSURANCE

## 2011-02-22 ENCOUNTER — Ambulatory Visit (INDEPENDENT_AMBULATORY_CARE_PROVIDER_SITE_OTHER): Payer: PRIVATE HEALTH INSURANCE | Admitting: *Deleted

## 2011-02-22 DIAGNOSIS — E119 Type 2 diabetes mellitus without complications: Secondary | ICD-10-CM

## 2011-02-22 DIAGNOSIS — Z23 Encounter for immunization: Secondary | ICD-10-CM

## 2011-05-30 ENCOUNTER — Other Ambulatory Visit: Payer: Self-pay | Admitting: Internal Medicine

## 2011-05-30 NOTE — Telephone Encounter (Signed)
A1c

## 2011-06-19 ENCOUNTER — Other Ambulatory Visit: Payer: Self-pay | Admitting: Internal Medicine

## 2011-06-19 DIAGNOSIS — Z1231 Encounter for screening mammogram for malignant neoplasm of breast: Secondary | ICD-10-CM

## 2011-06-20 ENCOUNTER — Ambulatory Visit
Admission: RE | Admit: 2011-06-20 | Discharge: 2011-06-20 | Disposition: A | Discharge status: Home / Self Care | Payer: PRIVATE HEALTH INSURANCE | Source: Ambulatory Visit | Attending: Internal Medicine | Admitting: Internal Medicine

## 2011-06-20 DIAGNOSIS — Z1231 Encounter for screening mammogram for malignant neoplasm of breast: Secondary | ICD-10-CM

## 2011-06-22 ENCOUNTER — Other Ambulatory Visit: Payer: Self-pay | Admitting: Internal Medicine

## 2011-08-22 ENCOUNTER — Other Ambulatory Visit: Payer: Self-pay | Admitting: Internal Medicine

## 2011-09-11 ENCOUNTER — Telehealth: Payer: Self-pay | Admitting: Internal Medicine

## 2011-09-11 NOTE — Telephone Encounter (Signed)
Caller: Connie/Patient; PCP: Marga Melnick; CB#: [phone]; ; ; Call regarding Sore Throat; onset 08/31/11 of sore throat and fever.  Continues to have sore throat.  Afebrile currently.  Denies emergent symptoms per protocol; advised appt within 24 hours.  Appt sched 09/12/11 1330 with Dr. Beverely Low.

## 2011-09-12 ENCOUNTER — Ambulatory Visit (INDEPENDENT_AMBULATORY_CARE_PROVIDER_SITE_OTHER): Payer: PRIVATE HEALTH INSURANCE | Admitting: Family Medicine

## 2011-09-12 ENCOUNTER — Encounter: Payer: Self-pay | Admitting: Family Medicine

## 2011-09-12 VITALS — BP 121/77 | HR 92 | Temp 98.2°F | Ht 64.0 in | Wt 182.4 lb

## 2011-09-12 DIAGNOSIS — R0982 Postnasal drip: Secondary | ICD-10-CM

## 2011-09-12 MED ORDER — FLUTICASONE PROPIONATE 50 MCG/ACT NA SUSP: 2.0000 | Freq: Every day | NASAL | Status: DC

## 2011-09-12 NOTE — Patient Instructions (Addendum)
Start the nasal spray- 2 sprays each nostril daily Drink plenty of fluids Add Mucinex to thin your congestion Call with any questions or concerns Hang in there!!!

## 2011-09-12 NOTE — Progress Notes (Signed)
  Subjective:    Patient ID: Alexandra Fletcher, female    DOB: 02-01-1951, 61 y.o.   MRN: [id number]  HPI URI- sxs started 6/7 w/ fever and body aches.  + nasal congestion.  Sore throat x2 weeks- worsens throughout the day.  No recent fevers.  No ear pain.  Denies facial pain/pressure.  + cough- productive.  No hx of seasonal allergies.  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   Review of Systems For ROS see HPI     Objective:   Physical Exam  Vitals reviewed. Constitutional: She appears well-developed and well-nourished. No distress.  HENT:  Head: Normocephalic and atraumatic.  Right Ear: Tympanic membrane normal.  Left Ear: Tympanic membrane normal.  Nose: Mucosal edema and rhinorrhea present. Right sinus exhibits no maxillary sinus tenderness and no frontal sinus tenderness. Left sinus exhibits no maxillary sinus tenderness and no frontal sinus tenderness.  Mouth/Throat: Mucous membranes are normal. Posterior oropharyngeal erythema (w/ PND) present.  Eyes: Conjunctivae and EOM are normal. Pupils are equal, round, and reactive to light.  Neck: Normal range of motion. Neck supple.  Cardiovascular: Normal rate, regular rhythm and normal heart sounds.   Pulmonary/Chest: Effort normal and breath sounds normal. No respiratory distress. She has no wheezes. She has no rales.  Lymphadenopathy:    She has no cervical adenopathy.          Assessment & Plan:

## 2011-09-16 NOTE — Assessment & Plan Note (Signed)
Pt's sore throat most likely due to PND.  No evidence of bacterial infxn- rapid strep negative.  Start nasal steroid spray.  Reviewed supportive care and red flags that should prompt return.  Pt expressed understanding and is in agreement w/ plan.

## 2011-10-04 ENCOUNTER — Other Ambulatory Visit: Payer: Self-pay | Admitting: Internal Medicine

## 2011-11-16 ENCOUNTER — Other Ambulatory Visit: Payer: Self-pay | Admitting: Internal Medicine

## 2011-11-16 ENCOUNTER — Telehealth: Payer: Self-pay | Admitting: Internal Medicine

## 2011-11-16 DIAGNOSIS — I10 Essential (primary) hypertension: Secondary | ICD-10-CM

## 2011-11-16 DIAGNOSIS — T887XXA Unspecified adverse effect of drug or medicament, initial encounter: Secondary | ICD-10-CM

## 2011-11-16 DIAGNOSIS — E785 Hyperlipidemia, unspecified: Secondary | ICD-10-CM

## 2011-11-16 DIAGNOSIS — E119 Type 2 diabetes mellitus without complications: Secondary | ICD-10-CM

## 2011-11-16 DIAGNOSIS — Z0000 Encounter for general adult medical examination without abnormal findings: Secondary | ICD-10-CM

## 2011-11-16 NOTE — Telephone Encounter (Signed)
wants lab drawn here prior tp CPE please put in orders ALSO NOTE advised pt insurance may not cover she does need to verify with them and she said she was ok with that

## 2011-11-16 NOTE — Telephone Encounter (Signed)
Pt has a future appt. Refill sent.

## 2011-11-20 NOTE — Telephone Encounter (Signed)
Orders placed.

## 2011-11-26 ENCOUNTER — Other Ambulatory Visit: Payer: Self-pay | Admitting: Internal Medicine

## 2011-11-27 ENCOUNTER — Other Ambulatory Visit (INDEPENDENT_AMBULATORY_CARE_PROVIDER_SITE_OTHER): Payer: BC Managed Care – PPO

## 2011-11-27 DIAGNOSIS — T887XXA Unspecified adverse effect of drug or medicament, initial encounter: Secondary | ICD-10-CM

## 2011-11-27 DIAGNOSIS — I10 Essential (primary) hypertension: Secondary | ICD-10-CM

## 2011-11-27 DIAGNOSIS — E119 Type 2 diabetes mellitus without complications: Secondary | ICD-10-CM

## 2011-11-27 DIAGNOSIS — Z0000 Encounter for general adult medical examination without abnormal findings: Secondary | ICD-10-CM

## 2011-11-27 DIAGNOSIS — E785 Hyperlipidemia, unspecified: Secondary | ICD-10-CM

## 2011-11-27 LAB — CBC WITH DIFFERENTIAL/PLATELET
Basophils Relative: 0.5 % (ref 0.0–3.0)
Eosinophils Relative: 11.9 % — ABNORMAL HIGH (ref 0.0–5.0)
Lymphocytes Relative: 28.5 % (ref 12.0–46.0)
MCV: 93.8 fl (ref 78.0–100.0)
Monocytes Absolute: 0.4 10*3/uL (ref 0.1–1.0)
Monocytes Relative: 5.7 % (ref 3.0–12.0)
Neutrophils Relative %: 53.4 % (ref 43.0–77.0)
RBC: 4.43 Mil/uL (ref 3.87–5.11)
WBC: 6.3 10*3/uL (ref 4.5–10.5)

## 2011-11-27 LAB — BASIC METABOLIC PANEL
BUN: 23 mg/dL (ref 6–23)
CO2: 25 mEq/L (ref 19–32)
Chloride: 102 mEq/L (ref 96–112)
Glucose, Bld: 128 mg/dL — ABNORMAL HIGH (ref 70–99)
Potassium: 3.9 mEq/L (ref 3.5–5.1)

## 2011-11-27 LAB — HEPATIC FUNCTION PANEL
ALT: 29 U/L (ref 0–35)
Albumin: 4.2 g/dL (ref 3.5–5.2)
Total Bilirubin: 0.4 mg/dL (ref 0.3–1.2)

## 2011-11-27 LAB — LIPID PANEL
HDL: 77.9 mg/dL (ref >39.00)
Total CHOL/HDL Ratio: 2
VLDL: 10.4 mg/dL (ref 0.0–40.0)

## 2011-11-27 NOTE — Progress Notes (Signed)
Labs only

## 2011-12-03 ENCOUNTER — Encounter: Payer: Self-pay | Admitting: Internal Medicine

## 2011-12-03 ENCOUNTER — Ambulatory Visit (INDEPENDENT_AMBULATORY_CARE_PROVIDER_SITE_OTHER): Payer: BC Managed Care – PPO | Admitting: Internal Medicine

## 2011-12-03 VITALS — BP 158/86 | HR 118 | Temp 98.6°F | Resp 12 | Ht 64.5 in | Wt 180.6 lb

## 2011-12-03 DIAGNOSIS — I10 Essential (primary) hypertension: Secondary | ICD-10-CM

## 2011-12-03 DIAGNOSIS — Z23 Encounter for immunization: Secondary | ICD-10-CM

## 2011-12-03 DIAGNOSIS — E119 Type 2 diabetes mellitus without complications: Secondary | ICD-10-CM

## 2011-12-03 DIAGNOSIS — Z780 Asymptomatic menopausal state: Secondary | ICD-10-CM

## 2011-12-03 DIAGNOSIS — D721 Eosinophilia, unspecified: Secondary | ICD-10-CM

## 2011-12-03 DIAGNOSIS — Z0000 Encounter for general adult medical examination without abnormal findings: Secondary | ICD-10-CM

## 2011-12-03 MED ORDER — AMLODIPINE BESYLATE 5 MG PO TABS: 5.0000 mg | ORAL_TABLET | Freq: Every day | ORAL | Status: DC

## 2011-12-03 MED ORDER — LISINOPRIL 40 MG PO TABS: ORAL_TABLET | ORAL | Status: DC

## 2011-12-03 MED ORDER — GLIMEPIRIDE 1 MG PO TABS: ORAL_TABLET | ORAL | Status: DC

## 2011-12-03 NOTE — Progress Notes (Signed)
  Subjective:    Patient ID: Alexandra Fletcher, female    DOB: February 19, 1951, 61 y.o.   MRN: [id number]  HPI  Junious Dresser  is here for a physical; she denies acute issues except for rare food dysphagia. This was diagnosed as GERD. Her son has had eosinophilic esophagitis; her eosinophil count has been elevated consistently. She denies associated hoarseness, abdominal pain, unexplained weight loss, melena, rectal bleeding.  She walks twice a week for at least 40 minutes and plays tennis for at least 180 minutes. She has no symptoms despite high level of cardiovascular exercise.      Review of Systems HYPERTENSION: Disease Monitoring: Blood pressure average: 125/67 Chest pain, palpitations- no     Dyspnea- no Medications: Compliance- yes  Lightheadedness,Syncope- no    Edema- no  DIABETES: Disease Monitoring: Blood Sugar average: 106; 2 hrs post meal 105, high < 160 Polyuria/phagia/dipsia- no       Visual problems-no Medications: Compliance- yes  Hypoglycemic symptoms- no  HYPERLIPIDEMIA: Her advanced cholesterol panel has revealed no significant risk. Because of the history of diabetes and cerebrovascular accident; minimal LDL goal is less than 100, ideally less than 70          Objective:   Physical Exam Gen.:  well-nourished in appearance. Alert, appropriate and cooperative throughout exam. Head: Normocephalic without obvious abnormalities  Eyes: No corneal or conjunctival inflammation noted. Pupils equal round reactive to light and accommodation. Fundal exam is benign without hemorrhages, exudate, papilledema. Extraocular motion intact. Vision grossly normal. Ears: External  ear exam reveals no significant lesions or deformities. Canals clear .TMs normal. Hearing is grossly normal bilaterally. Nose: External nasal exam reveals no deformity or inflammation. Nasal mucosa are pink and moist. No lesions or exudates noted.  Mouth: Oral mucosa and oropharynx reveal no lesions or  exudates. Teeth in good repair. Neck: No deformities, masses, or tenderness noted. Range of motion & Thyroid  normal Lungs: Normal respiratory effort; chest expands symmetrically. Lungs are clear to auscultation without rales, wheezes, or increased work of breathing. Heart: Normal rate and rhythm. Normal S1 and S2. No gallop, click, or rub. S4;with slight slurring; no murmur. Abdomen: Bowel sounds normal; abdomen soft and nontender. No masses, organomegaly or hernias noted. Genitalia: Dr Eda Paschal Musculoskeletal/extremities: Slight lordosis noted of  the thoracic  spine. No clubbing, cyanosis, edema, or deformity noted. Range of motion  normal .Tone & strength  normal.Joints normal.Pes planus. Nail health  good. Vascular: Carotid, radial artery, dorsalis pedis and  posterior tibial pulses are full and equal. No bruits present. Neurologic: Alert and oriented x3. Deep tendon reflexes symmetrical and normal.          Skin: Intact without suspicious lesions or rashes. Lymph: No cervical, axillary lymphadenopathy present. Psych: Mood and affect are normal. Normally interactive                                                                                         Assessment & Plan:  #1 comprehensive physical exam; no acute findings #2 see Problem List with Assessments & Recommendations Plan: see Orders

## 2011-12-03 NOTE — Patient Instructions (Addendum)
The triggers for reflux  include stress; the "aspirin family" ; alcohol; peppermint; and caffeine (coffee, tea, cola, and chocolate). The aspirin family would include aspirin and the nonsteroidal agents such as ibuprofen &  Naproxen. Tylenol would not cause reflux. If having symptoms ; food & drink should be avoided for @ least 2 hours before going to bed.  To prevent palpitations or premature beats, avoid stimulants such as decongestants, diet pills, nicotine, or caffeine (coffee, tea, cola, or chocolate) to excess.   If you activate My Chart; the results can be released to you as soon as they populate from the lab. If you choose not to use this program; the labs have to be reviewed, copied & mailed   causing a delay in getting the results to you.

## 2011-12-03 NOTE — Addendum Note (Signed)
Addended by: Maurice Small on: 12/03/2011 11:53 AM   Modules accepted: Orders

## 2011-12-13 ENCOUNTER — Ambulatory Visit (INDEPENDENT_AMBULATORY_CARE_PROVIDER_SITE_OTHER)
Admission: RE | Admit: 2011-12-13 | Discharge: 2011-12-13 | Disposition: A | Discharge status: Home / Self Care | Payer: BC Managed Care – PPO | Source: Ambulatory Visit

## 2011-12-13 DIAGNOSIS — Z780 Asymptomatic menopausal state: Secondary | ICD-10-CM

## 2012-02-19 ENCOUNTER — Other Ambulatory Visit: Payer: Self-pay | Admitting: Internal Medicine

## 2012-02-19 NOTE — Telephone Encounter (Signed)
Rx sent.    MW 

## 2012-05-21 ENCOUNTER — Other Ambulatory Visit: Payer: Self-pay | Admitting: Internal Medicine

## 2012-05-21 NOTE — Telephone Encounter (Signed)
A1c 250.00 

## 2012-05-22 ENCOUNTER — Telehealth: Payer: Self-pay | Admitting: Internal Medicine

## 2012-05-22 NOTE — Telephone Encounter (Signed)
Patient is due for a1c and would like to go to [facility]. Can you place order please?

## 2012-05-22 NOTE — Telephone Encounter (Signed)
Order placed

## 2012-05-23 ENCOUNTER — Other Ambulatory Visit: Payer: Self-pay | Admitting: Internal Medicine

## 2012-06-17 ENCOUNTER — Other Ambulatory Visit (INDEPENDENT_AMBULATORY_CARE_PROVIDER_SITE_OTHER): Payer: BC Managed Care – PPO

## 2012-06-17 DIAGNOSIS — E119 Type 2 diabetes mellitus without complications: Secondary | ICD-10-CM

## 2012-06-18 ENCOUNTER — Other Ambulatory Visit: Payer: Self-pay | Admitting: Internal Medicine

## 2012-06-20 ENCOUNTER — Other Ambulatory Visit: Payer: Self-pay | Admitting: Internal Medicine

## 2012-06-27 ENCOUNTER — Telehealth: Payer: Self-pay | Admitting: *Deleted

## 2012-06-27 NOTE — Telephone Encounter (Signed)
Discuss with patient   A1c assesses average 24 hour glucose over prior 6-12 weeks. A1c GOALS:  No Diabetes risk if < 6.1%  "Pre Diabetes" :6.2-6.4 %  Excellent diabetic control: 6.5-7 %  Fair diabetic control: 7-8 %  Poor diabetic control: greater than 8 % ( except with additional factors such as advanced age; significant coronary or neurologic disease,etc).  Check the A1c every 4 months as it 6.5% or higher.  Goals for home glucose monitoring are : fasting or morning glucose goal of 100-150. Two hours after any meal , goal = < 180, preferably < 160.  Report any low blood glucoses immediately.  [employer]

## 2012-07-25 ENCOUNTER — Other Ambulatory Visit: Payer: Self-pay

## 2012-07-25 DIAGNOSIS — Z1231 Encounter for screening mammogram for malignant neoplasm of breast: Secondary | ICD-10-CM

## 2012-08-06 ENCOUNTER — Ambulatory Visit (INDEPENDENT_AMBULATORY_CARE_PROVIDER_SITE_OTHER): Payer: BC Managed Care – PPO | Admitting: Women's Health

## 2012-08-06 ENCOUNTER — Encounter: Payer: Self-pay | Admitting: Women's Health

## 2012-08-06 VITALS — BP 130/80 | Ht 64.0 in | Wt 173.0 lb

## 2012-08-06 DIAGNOSIS — Z01419 Encounter for gynecological examination (general) (routine) without abnormal findings: Secondary | ICD-10-CM

## 2012-08-06 NOTE — Progress Notes (Signed)
Alexandra Fletcher 1950/11/02 [id number]    History:    The patient presents for annual exam.  Postmenopausal with no bleeding/no HRT. Benign colon polyp 2003. Type 2 diabetes/hypertension/hypercholesterolemia-Dr. Alwyn Ren manages. Normal bone density 2013. History of normal mammograms. Ascus with colposcopy showing CIN-1 2004, ascus with negative HR HPV 2006 with normal Paps after. Hysteroscopic D&C for DUB with left ovarian cystectomy 2006.  Past medical history, past surgical history, family history and social history were all reviewed and documented in the EPIC chart. Teaches swimming. Active lifestyle. Brother with type 1 diabetes/MI. Father type 2 diabetes, multiple myeloma, heart disease. Mother hypertension. 2 children both doing well.   ROS:  A  ROS was performed and pertinent positives and negatives are included in the history.  Exam:  Filed Vitals:   08/06/12 0944  BP: 130/80    General appearance:  Normal Head/Neck:  Normal, without cervical or supraclavicular adenopathy. Thyroid:  Symmetrical, normal in size, without palpable masses or nodularity. Respiratory  Effort:  Normal  Auscultation:  Clear without wheezing or rhonchi Cardiovascular  Auscultation:  Regular rate, without rubs, murmurs or gallops  Edema/varicosities:  Not grossly evident Abdominal  Soft,nontender, without masses, guarding or rebound.  Liver/spleen:  No organomegaly noted  Hernia:  None appreciated  Skin  Inspection:  Grossly normal  Palpation:  Grossly normal Neurologic/psychiatric  Orientation:  Normal with appropriate conversation.  Mood/affect:  Normal  Genitourinary    Breasts: Examined lying and sitting.     Right: Without masses, retractions, discharge or axillary adenopathy.     Left: Without masses, retractions, discharge or axillary adenopathy.   Inguinal/mons:  Normal without inguinal adenopathy  External genitalia:  Normal  BUS/Urethra/Skene's glands:  Normal  Bladder:   Normal  Vagina:  Normal  Cervix:  Normal  Uterus:   normal in size, shape and contour.  Midline and mobile  Adnexa/parametria:     Rt: Without masses or tenderness.   Lt: Without masses or tenderness.  Anus and perineum: Normal  Digital rectal exam: Normal sphincter tone without palpated masses or tenderness  Assessment/Plan:  62 y.o.  MWF G3P2 for annual exam with no complaints.  Normal postmenopausal exam/no HRT/no bleeding Type 2 diabetes/hypertension/hypercholesterolemia-primary care manages labs and meds History of CIN-1 normal Pap since 2006.  Plan: SBE's, continue annual mammogram, calcium rich diet, vitamin D 2000 daily. Normal DEXA 2013, managed by primary care. Home safety and fall prevention and importance of exercise reviewed. UA, Pap. New Pap screening guidelines reviewed. Last normal Pap 2011.   Harrington Challenger WHNP, 2:15 PM 08/06/2012

## 2012-08-06 NOTE — Patient Instructions (Addendum)
Dr Talmage Nap  Endocrinologist   Health Recommendations for Postmenopausal Women Respected and ongoing research has looked at the most common causes of death, disability, and poor quality of life in postmenopausal women. The causes include heart disease, diseases of blood vessels, diabetes, depression, cancer, and bone loss (osteoporosis). Many things can be done to help lower the chances of developing these and other common problems: CARDIOVASCULAR DISEASE Heart Disease: A heart attack is a medical emergency. Know the signs and symptoms of a heart attack. Below are things women can do to reduce their risk for heart disease.   Do not smoke. If you smoke, quit.  Aim for a healthy weight. Being overweight causes many preventable deaths. Eat a healthy and balanced diet and drink an adequate amount of liquids.  Get moving. Make a commitment to be more physically active. Aim for 30 minutes of activity on most, if not all days of the week.  Eat for heart health. Choose a diet that is low in saturated fat and cholesterol and eliminate trans fat. Include whole grains, vegetables, and fruits. Read and understand the labels on food containers before buying.  Know your numbers. Ask your caregiver to check your blood pressure, cholesterol (total, HDL, LDL, triglycerides) and blood glucose. Work with your caregiver on improving your entire clinical picture.  High blood pressure. Limit or stop your table salt intake (try salt substitute and food seasonings). Avoid salty foods and drinks. Read labels on food containers before buying. Eating well and exercising can help control high blood pressure. STROKE  Stroke is a medical emergency. Stroke may be the result of a blood clot in a blood vessel in the brain or by a brain hemorrhage (bleeding). Know the signs and symptoms of a stroke. To lower the risk of developing a stroke:  Avoid fatty foods.  Quit smoking.  Control your diabetes, blood pressure, and  irregular heart rate. THROMBOPHLEBITIS (BLOOD CLOT) OF THE LEG  Becoming overweight and leading a stationary lifestyle may also contribute to developing blood clots. Controlling your diet and exercising will help lower the risk of developing blood clots. CANCER SCREENING  Breast Cancer: Take steps to reduce your risk of breast cancer.  You should practice "breast self-awareness." This means understanding the normal appearance and feel of your breasts and should include breast self-examination. Any changes detected, no matter how small, should be reported to your caregiver.  After age 61, you should have a clinical breast exam (CBE) every year.  Starting at age 26, you should consider having a mammogram (breast X-ray) every year.  If you have a family history of breast cancer, talk to your caregiver about genetic screening.  If you are at high risk for breast cancer, talk to your caregiver about having an MRI and a mammogram every year.  Intestinal or Stomach Cancer: Tests to consider are a rectal exam, fecal occult blood, sigmoidoscopy, and colonoscopy. Women who are high risk may need to be screened at an earlier age and more often.  Cervical Cancer:  Beginning at age 53, you should have a Pap test every 3 years as long as the past 3 Pap tests have been normal.  If you have had past treatment for cervical cancer or a condition that could lead to cancer, you need Pap tests and screening for cancer for at least 20 years after your treatment.  If you had a hysterectomy for a problem that was not cancer or a condition that could lead to cancer, then you  no longer need Pap tests.  If you are between ages 71 and 21, and you have had normal Pap tests going back 10 years, you no longer need Pap tests.  If Pap tests have been discontinued, risk factors (such as a new sexual partner) need to be reassessed to determine if screening should be resumed.  Some medical problems can increase the  chance of getting cervical cancer. In these cases, your caregiver may recommend more frequent screening and Pap tests.  Uterine Cancer: If you have vaginal bleeding after reaching menopause, you should notify your caregiver.  Ovarian cancer: Other than yearly pelvic exams, there are no reliable tests available to screen for ovarian cancer at this time except for yearly pelvic exams.  Lung Cancer: Yearly chest X-rays can detect lung cancer and should be done on high risk women, such as cigarette smokers and women with chronic lung disease (emphysema).  Skin Cancer: A complete body skin exam should be done at your yearly examination. Avoid overexposure to the sun and ultraviolet light lamps. Use a strong sun block cream when in the sun. All of these things are important in lowering the risk of skin cancer. MENOPAUSE Menopause Symptoms: Hormone therapy products are effective for treating symptoms associated with menopause:  Moderate to severe hot flashes.  Night sweats.  Mood swings.  Headaches.  Tiredness.  Loss of sex drive.  Insomnia.  Other symptoms. Hormone replacement carries certain risks, especially in older women. Women who use or are thinking about using estrogen or estrogen with progestin treatments should discuss that with their caregiver. Your caregiver will help you understand the benefits and risks. The ideal dose of hormone replacement therapy is not known. The Food and Drug Administration (FDA) has concluded that hormone therapy should be used only at the lowest doses and for the shortest amount of time to reach treatment goals.  OSTEOPOROSIS Protecting Against Bone Loss and Preventing Fracture: If you use hormone therapy for prevention of bone loss (osteoporosis), the risks for bone loss must outweigh the risk of the therapy. Ask your caregiver about other medications known to be safe and effective for preventing bone loss and fractures. To guard against bone loss or  fractures, the following is recommended:  If you are less than age 88, take 1000 mg of calcium and at least 600 mg of Vitamin D per day.  If you are greater than age 12 but less than age 55, take 1200 mg of calcium and at least 600 mg of Vitamin D per day.  If you are greater than age 66, take 1200 mg of calcium and at least 800 mg of Vitamin D per day. Smoking and excessive alcohol intake increases the risk of osteoporosis. Eat foods rich in calcium and vitamin D and do weight bearing exercises several times a week as your caregiver suggests. DIABETES Diabetes Melitus: If you have Type I or Type 2 diabetes, you should keep your blood sugar under control with diet, exercise and recommended medication. Avoid too many sweets, starchy and fatty foods. Being overweight can make control more difficult. COGNITION AND MEMORY Cognition and Memory: Menopausal hormone therapy is not recommended for the prevention of cognitive disorders such as Alzheimer's disease or memory loss.  DEPRESSION  Depression may occur at any age, but is common in elderly women. The reasons may be because of physical, medical, social (loneliness), or financial problems and needs. If you are experiencing depression because of medical problems and control of symptoms, talk to your  caregiver about this. Physical activity and exercise may help with mood and sleep. Community and volunteer involvement may help your sense of value and worth. If you have depression and you feel that the problem is getting worse or becoming severe, talk to your caregiver about treatment options that are best for you. ACCIDENTS  Accidents are common and can be serious in the elderly woman. Prepare your house to prevent accidents. Eliminate throw rugs, place hand bars in the bath, shower and toilet areas. Avoid wearing high heeled shoes or walking on wet, snowy, and icy areas. Limit or stop driving if you have vision or hearing problems, or you feel you are  unsteady with you movements and reflexes. HEPATITIS C Hepatitis C is a type of viral infection affecting the liver. It is spread mainly through contact with blood from an infected person. It can be treated, but if left untreated, it can lead to severe liver damage over years. Many people who are infected do not know that the virus is in their blood. If you are a "baby-boomer", it is recommended that you have one screening test for Hepatitis C. IMMUNIZATIONS  Several immunizations are important to consider having during your senior years, including:   Tetanus, diptheria, and pertussis booster shot.  Influenza every year before the flu season begins.  Pneumonia vaccine.  Shingles vaccine.  Others as indicated based on your specific needs. Talk to your caregiver about these. Document Released: 04/27/2005 Document Revised: 02/20/2012 Document Reviewed: 12/22/2007 [facility] Patient Information 2014 [place].

## 2012-08-27 ENCOUNTER — Ambulatory Visit
Admission: RE | Admit: 2012-08-27 | Discharge: 2012-08-27 | Disposition: A | Discharge status: Home / Self Care | Payer: BC Managed Care – PPO | Source: Ambulatory Visit

## 2012-08-27 DIAGNOSIS — Z1231 Encounter for screening mammogram for malignant neoplasm of breast: Secondary | ICD-10-CM

## 2012-10-17 ENCOUNTER — Telehealth: Payer: Self-pay | Admitting: Internal Medicine

## 2012-10-17 DIAGNOSIS — E119 Type 2 diabetes mellitus without complications: Secondary | ICD-10-CM

## 2012-10-17 NOTE — Telephone Encounter (Signed)
Patient states she is due to check A1c and would like to go [place] location. Can you place orders please?

## 2012-10-17 NOTE — Telephone Encounter (Signed)
Order placed, left message on VM informing patient order placed

## 2012-12-11 ENCOUNTER — Other Ambulatory Visit: Payer: Self-pay | Admitting: Internal Medicine

## 2012-12-12 NOTE — Telephone Encounter (Signed)
Med filled.  

## 2012-12-16 ENCOUNTER — Telehealth: Payer: Self-pay

## 2012-12-16 NOTE — Telephone Encounter (Signed)
LM for CB  HM UTD WE flu and T-dap vaccine

## 2012-12-16 NOTE — Telephone Encounter (Signed)
Meds Reconciled, pharmacy and allergies verified.   Flu and Tdap vaccines upon arrival. HM UTD otherwise

## 2012-12-17 ENCOUNTER — Encounter: Payer: Self-pay | Admitting: Internal Medicine

## 2012-12-17 ENCOUNTER — Ambulatory Visit (INDEPENDENT_AMBULATORY_CARE_PROVIDER_SITE_OTHER): Payer: BC Managed Care – PPO | Admitting: Internal Medicine

## 2012-12-17 VITALS — BP 156/82 | HR 114 | Temp 98.5°F | Ht 64.25 in | Wt 177.2 lb

## 2012-12-17 DIAGNOSIS — E119 Type 2 diabetes mellitus without complications: Secondary | ICD-10-CM

## 2012-12-17 DIAGNOSIS — I10 Essential (primary) hypertension: Secondary | ICD-10-CM

## 2012-12-17 DIAGNOSIS — E785 Hyperlipidemia, unspecified: Secondary | ICD-10-CM

## 2012-12-17 DIAGNOSIS — Z0000 Encounter for general adult medical examination without abnormal findings: Secondary | ICD-10-CM

## 2012-12-17 DIAGNOSIS — Z23 Encounter for immunization: Secondary | ICD-10-CM

## 2012-12-17 LAB — MICROALBUMIN / CREATININE URINE RATIO
Creatinine,U: 168.7 mg/dL
Microalb, Ur: 0.9 mg/dL (ref 0.0–1.9)

## 2012-12-17 LAB — CBC WITH DIFFERENTIAL/PLATELET
Basophils Absolute: 0 10*3/uL (ref 0.0–0.1)
Eosinophils Relative: 5.1 % — ABNORMAL HIGH (ref 0.0–5.0)
HCT: 43.7 % (ref 36.0–46.0)
Lymphs Abs: 1.3 10*3/uL (ref 0.7–4.0)
MCV: 91.2 fl (ref 78.0–100.0)
Monocytes Absolute: 0.3 10*3/uL (ref 0.1–1.0)
Neutrophils Relative %: 70.1 % (ref 43.0–77.0)
Platelets: 241 10*3/uL (ref 150.0–400.0)
RDW: 12.7 % (ref 11.5–14.6)
WBC: 6.6 10*3/uL (ref 4.5–10.5)

## 2012-12-17 LAB — HEMOGLOBIN A1C: Hgb A1c MFr Bld: 6.7 % — ABNORMAL HIGH (ref 4.6–6.5)

## 2012-12-17 NOTE — Progress Notes (Signed)
Subjective:    Patient ID: Alexandra Fletcher, female    DOB: 15-Jun-1950, 62 y.o.   MRN: [id number]  HPI  Alexandra Fletcher is here for a physical;acute issues denied.      Review of Systems She is on a heart healthy, low carb diet; she exercises as walking> 25 minutes 4 times per week without symptoms. Specifically she denies chest pain, palpitations, dyspnea, or claudication. Family history is positive for premature coronary disease in he mother. Cholesterol LDL goal is less than 100, ideally < 70. No  Statin to date. Fasting  glucose  average 110. Highest glucose 2 hours after any meal is <130. No hypoglycemia reported.                                                  No medication adverse effects noted. Eye exam current;; minimal but stable retinopathy. Foot care not current.   No excess thirst ;  excess hunger ; or excess urination reported                              No lightheadedness with standing reported                No non healing skin  ulcers or sores of extremities noted. No numbness or tingling or burning in feet described                                                                                                                                             No significant change in weight . No blurred,double, or loss of vision reported  .         Objective:   Physical Exam Gen.: Healthy and well-nourished in appearance. Alert, appropriate and cooperative throughout exam.Appears younger than stated age  Head: Normocephalic without obvious abnormalities  Eyes: No corneal or conjunctival inflammation noted. Pupils equal round reactive to light and accommodation. Extraocular motion intact. Ears: External  ear exam reveals no significant lesions or deformities. Some wax bilaterally. Hearing is grossly normal bilaterally. Nose: External nasal exam reveals no deformity or inflammation. Nasal mucosa are pink and moist. No lesions or exudates noted.  Mouth: Oral mucosa and oropharynx  reveal no lesions or exudates. Teeth in good repair. Neck: No deformities, masses, or tenderness noted. Range of motion & Thyroid normal. Lungs: Normal respiratory effort; chest expands symmetrically. Lungs are clear to auscultation without rales, wheezes, or increased work of breathing. Heart: Normal rate and rhythm. Normal S1 and S2. No gallop, click, or rub. S4 with slurring ; no murmur. Abdomen: Bowel sounds normal; abdomen soft and nontender. No masses, organomegaly or hernias noted. Genitalia: As per [facility]  Musculoskeletal/extremities:Accentuated curvature of upper thoracic  Spine.  No clubbing, cyanosis,or  edema noted. Range of motion normal .Tone & strength  Normal. Joints  reveal significant  DJD DIP changes. Nail health good. Able to lie down & sit up w/o help. Negative SLR bilaterally to 90 degrees Vascular: Carotid, radial artery, dorsalis pedis and  posterior tibial pulses are full and equal. No bruits present. Neurologic: Alert and oriented x3. Deep tendon reflexes symmetrical and normal.      Skin: Intact without suspicious lesions or rashes. Lymph: No cervical, axillary lymphadenopathy present. Psych: Mood and affect are normal. Normally interactive                                                                                        Assessment & Plan:  #1 comprehensive physical exam; no acute findings  Plan: see Orders  & Recommendations

## 2012-12-17 NOTE — Patient Instructions (Addendum)
Monitor the glucose before eating  (fasting blood sugar) Monday, Wednesday, Friday, and Sunday. This should range from 100-150. Check glucose 2 hours after  breakfast on Tuesday; 2 hours after lunch on Thursday; and 2 hours after the meal on Saturday. This value should average  less than 180, ideally less than 160. Take the EKG to any emergency room or preop visits. There are nonspecific changes; as long as there is no new change these are not clinically significant . If the old EKG is not available for comparison; it may result in unnecessary hospitalization for observation with significant unnecessary expense.

## 2012-12-18 LAB — LIPID PANEL
Cholesterol: 199 mg/dL (ref 0–200)
HDL: 80.1 mg/dL (ref >39.00)
LDL Cholesterol: 110 mg/dL — ABNORMAL HIGH (ref 0–99)
Triglycerides: 47 mg/dL (ref 0.0–149.0)
VLDL: 9.4 mg/dL (ref 0.0–40.0)

## 2012-12-18 LAB — HEPATIC FUNCTION PANEL
Bilirubin, Direct: 0.1 mg/dL (ref 0.0–0.3)
Total Bilirubin: 0.6 mg/dL (ref 0.3–1.2)

## 2012-12-18 LAB — BASIC METABOLIC PANEL
BUN: 21 mg/dL (ref 6–23)
Chloride: 104 mEq/L (ref 96–112)
Creatinine, Ser: 0.9 mg/dL (ref 0.4–1.2)
GFR: 69.9 mL/min (ref >60.00)
Glucose, Bld: 183 mg/dL — ABNORMAL HIGH (ref 70–99)
Potassium: 4 mEq/L (ref 3.5–5.1)

## 2012-12-20 ENCOUNTER — Other Ambulatory Visit: Payer: Self-pay | Admitting: Internal Medicine

## 2012-12-22 ENCOUNTER — Other Ambulatory Visit: Payer: Self-pay | Admitting: *Deleted

## 2012-12-22 DIAGNOSIS — I10 Essential (primary) hypertension: Secondary | ICD-10-CM

## 2012-12-22 MED ORDER — AMLODIPINE BESYLATE 5 MG PO TABS: 5.0000 mg | ORAL_TABLET | Freq: Every day | ORAL | Status: DC

## 2012-12-22 NOTE — Telephone Encounter (Signed)
Amlodipine refilled and sent to pharmacy

## 2012-12-24 ENCOUNTER — Other Ambulatory Visit: Payer: Self-pay | Admitting: Internal Medicine

## 2012-12-24 NOTE — Telephone Encounter (Signed)
Glimepiride and Amlodipine refills sent to pharmacy

## 2013-01-04 ENCOUNTER — Encounter: Payer: Self-pay | Admitting: Internal Medicine

## 2013-01-04 DIAGNOSIS — Z8673 Personal history of transient ischemic attack (TIA), and cerebral infarction without residual deficits: Secondary | ICD-10-CM | POA: Insufficient documentation

## 2013-01-04 DIAGNOSIS — I63531 Cerebral infarction due to unspecified occlusion or stenosis of right posterior cerebral artery: Secondary | ICD-10-CM | POA: Insufficient documentation

## 2013-01-20 ENCOUNTER — Ambulatory Visit (INDEPENDENT_AMBULATORY_CARE_PROVIDER_SITE_OTHER): Payer: BC Managed Care – PPO

## 2013-01-20 DIAGNOSIS — Z23 Encounter for immunization: Secondary | ICD-10-CM

## 2013-02-11 ENCOUNTER — Ambulatory Visit (INDEPENDENT_AMBULATORY_CARE_PROVIDER_SITE_OTHER): Payer: BC Managed Care – PPO | Admitting: *Deleted

## 2013-02-11 DIAGNOSIS — Z23 Encounter for immunization: Secondary | ICD-10-CM

## 2013-02-20 ENCOUNTER — Other Ambulatory Visit: Payer: Self-pay | Admitting: Internal Medicine

## 2013-02-20 NOTE — Telephone Encounter (Signed)
Lisinopril refilled per protocol 

## 2013-03-07 ENCOUNTER — Other Ambulatory Visit: Payer: Self-pay | Admitting: Internal Medicine

## 2013-03-09 NOTE — Telephone Encounter (Signed)
Rx sent to the pharmacy by e-script.//AB/CMA 

## 2013-03-13 ENCOUNTER — Encounter: Payer: Self-pay | Admitting: Family Medicine

## 2013-03-13 ENCOUNTER — Ambulatory Visit (INDEPENDENT_AMBULATORY_CARE_PROVIDER_SITE_OTHER): Payer: BC Managed Care – PPO | Admitting: Family Medicine

## 2013-03-13 VITALS — BP 152/80 | HR 110 | Temp 98.9°F | Ht 64.0 in | Wt 171.1 lb

## 2013-03-13 DIAGNOSIS — R509 Fever, unspecified: Secondary | ICD-10-CM

## 2013-03-13 DIAGNOSIS — E119 Type 2 diabetes mellitus without complications: Secondary | ICD-10-CM

## 2013-03-13 DIAGNOSIS — J209 Acute bronchitis, unspecified: Secondary | ICD-10-CM

## 2013-03-13 DIAGNOSIS — R05 Cough: Secondary | ICD-10-CM

## 2013-03-13 DIAGNOSIS — I10 Essential (primary) hypertension: Secondary | ICD-10-CM

## 2013-03-13 LAB — POCT INFLUENZA A/B
Influenza A, POC: NEGATIVE
Influenza B, POC: NEGATIVE

## 2013-03-13 MED ORDER — HYDROCODONE-HOMATROPINE 5-1.5 MG/5ML PO SYRP: 5.0000 mL | ORAL_SOLUTION | Freq: Three times a day (TID) | ORAL | Status: DC | PRN

## 2013-03-13 MED ORDER — CEFDINIR 300 MG PO CAPS: 300.0000 mg | ORAL_CAPSULE | Freq: Two times a day (BID) | ORAL | Status: AC

## 2013-03-13 NOTE — Assessment & Plan Note (Signed)
Sugars trendin gup towards 150 with illness, encouraged to cut down more on carbs until feeling better and report if numbers remain hi

## 2013-03-13 NOTE — Assessment & Plan Note (Signed)
Mild elevation with acute illness, encouraged to avoid OTC cough and cold preps

## 2013-03-13 NOTE — Progress Notes (Signed)
Pre visit review using our clinic review tool, if applicable. No additional management support is needed unless otherwise documented below in the visit note. 

## 2013-03-13 NOTE — Assessment & Plan Note (Signed)
Influenza POCT negative. Started on antibiotics, increase rest and hydration. Start Mucinex and probiotics. Report if no improvement in symptoms. Given cough syrup for each bedtime use when necessary

## 2013-03-13 NOTE — Patient Instructions (Signed)
Mucinex 600mg  twice daily x 10 days Probiotic such as Digestive Advantage   Bronchitis Bronchitis is the body's way of reacting to injury and/or infection (inflammation) of the bronchi. Bronchi are the air tubes that extend from the windpipe into the lungs. If the inflammation becomes severe, it may cause shortness of breath. CAUSES  Inflammation may be caused by:  A virus.  Germs (bacteria).  Dust.  Allergens.  Pollutants and many other irritants. The cells lining the bronchial tree are covered with tiny hairs (cilia). These constantly beat upward, away from the lungs, toward the mouth. This keeps the lungs free of pollutants. When these cells become too irritated and are unable to do their job, mucus begins to develop. This causes the characteristic cough of bronchitis. The cough clears the lungs when the cilia are unable to do their job. Without either of these protective mechanisms, the mucus would settle in the lungs. Then you would develop pneumonia. Smoking is a common cause of bronchitis and can contribute to pneumonia. Stopping this habit is the single most important thing you can do to help yourself. TREATMENT   Your caregiver may prescribe an antibiotic if the cough is caused by bacteria. Also, medicines that open up your airways make it easier to breathe. Your caregiver may also recommend or prescribe an expectorant. It will loosen the mucus to be coughed up. Only take over-the-counter or prescription medicines for pain, discomfort, or fever as directed by your caregiver.  Removing whatever causes the problem (smoking, for example) is critical to preventing the problem from getting worse.  Cough suppressants may be prescribed for relief of cough symptoms.  Inhaled medicines may be prescribed to help with symptoms now and to help prevent problems from returning.  For those with recurrent (chronic) bronchitis, there may be a need for steroid medicines. SEEK IMMEDIATE MEDICAL  CARE IF:   During treatment, you develop more pus-like mucus (purulent sputum).  You have a fever.  You become progressively more ill.  You have increased difficulty breathing, wheezing, or shortness of breath. It is necessary to seek immediate medical care if you are elderly or sick from any other disease. MAKE SURE YOU:   Understand these instructions.  Will watch your condition.  Will get help right away if you are not doing well or get worse. Document Released: 03/05/2005 Document Revised: 11/05/2012 Document Reviewed: 10/28/2012 [facility] Patient Information 2014 [place], [place].

## 2013-03-13 NOTE — Progress Notes (Signed)
Alexandra Fletcher [id number] Jan 03, 1951 03/13/2013      Progress Note-Follow Up  Subjective  Chief Complaint  Chief Complaint  Patient presents with  . flu like symptoms    X 6 days- weak, tired, fever, cough, headache, muscle aches    HPI  Patient is a 62 year old Caucasian female who is in today with 6 days worth of worsening respiratory symptoms. She has fevers and chills. She notes malaise myalgias and headaches. She has had a worsening cough. Is productive of green thick phlegm. She has had congestion and chest congestion. Struggling with postnasal drip and anorexia. Notes some wheezing occurs both day and night. Denies sore throat, ear pain, nausea, vomiting or diarrhea.  Past Medical History  Diagnosis Date  . Diabetes mellitus   . Hypertension   . Hyperlipidemia   . Rectal polyp 2003     Dr Juanda Chance  . Heart murmur   . Cerebral thrombosis with cerebral infarction 2002    diagnosis needs confirmed by review of [facility] records    Past Surgical History  Procedure Laterality Date  . Knee arthroscopy w/ meniscal repair  2008     Dr Earl Many knee  . Dilation and curettage of uterus      X2  . Thumb surgery  1983    Left; post skiing accident in [place]  . Colonoscopy with polypectomy  2003    Dr Juanda Chance; rectal polyps  . Ovarian cysts removed       Dr Randell Patient    Family History  Problem Relation Age of Onset  . Liver disease Mother     ? malignancy after Hepatitis  . Diabetes Father   . Heart disease Father     CBAG @ 84  . Multiple myeloma Father   . Diabetes Brother     Type 1 DM  . Heart attack Brother 54  . Colon cancer Neg Hx   . Stroke Maternal Aunt     > 65    History   Social History  . Marital Status: Married    Spouse Name: N/A    Number of Children: 2  . Years of Education: N/A   Occupational History  . Swim Trainer    Social History Main Topics  . Smoking status: Never Smoker   . Smokeless tobacco: Never Used  . Alcohol Use: Yes      Comment: occasional glass of wine socially  . Drug Use: No  . Sexual Activity: Yes    Birth Control/ Protection: Post-menopausal   Other Topics Concern  . Not on file   Social History Narrative   1 cup of coffee daily     Current Outpatient Prescriptions on File Prior to Visit  Medication Sig Dispense Refill  . amLODipine (NORVASC) 5 MG tablet TAKE ONE (1) TABLET BY MOUTH EVERY DAY  90 tablet  1  . aspirin (BABY ASPIRIN) 81 MG chewable tablet Chew 81 mg by mouth daily.        . Cholecalciferol 1000 UNITS capsule Take 1,000 Units by mouth daily.        Marland Kitchen glimepiride (AMARYL) 1 MG tablet TAKE ONE-HALF TABLET BY MOUTH DAILY  45 tablet  1  . glucose blood (ONE TOUCH ULTRA TEST) test strip Use as instructed  100 each  3  . JANUVIA 100 MG tablet TAKE ONE (1) TABLET BY MOUTH EVERY      DAY  90 tablet  0  . Ketoprofen POWD Ketoprofen 20% gel.  Apply to affected  area 3-4 times per day.  60 g  PRN  . lisinopril (PRINIVIL,ZESTRIL) 40 MG tablet TAKE ONE (1) TABLET BY MOUTH EVERY DAY  90 tablet  1  . metFORMIN (GLUCOPHAGE) 1000 MG tablet TAKE ONE (1) TABLET BY MOUTH TWO (2) TIMES DAILY  180 tablet  1  . Omega-3 Fatty Acids (FISH OIL) 1000 MG CAPS Take by mouth daily.        . ONE TOUCH ULTRA TEST test strip USE AS INSTRUCTED  100 each  3   No current facility-administered medications on file prior to visit.    Allergies  Allergen Reactions  . Pioglitazone     REACTION: weight gain & ankle edema    Review of Systems  Review of Systems  Constitutional: Positive for fever, chills and malaise/fatigue.  HENT: Positive for congestion.   Eyes: Negative for discharge.  Respiratory: Positive for cough, sputum production, shortness of breath and wheezing.   Cardiovascular: Negative for chest pain, palpitations and leg swelling.  Gastrointestinal: Negative for nausea, abdominal pain and diarrhea.  Genitourinary: Negative for dysuria.  Musculoskeletal: Positive for myalgias. Negative for  falls.  Skin: Negative for rash.  Neurological: Positive for headaches. Negative for loss of consciousness.  Endo/Heme/Allergies: Negative for polydipsia.  Psychiatric/Behavioral: Negative for depression and suicidal ideas. The patient is not nervous/anxious and does not have insomnia.     Objective  BP 152/80  Pulse 110  Temp(Src) 98.9 F (37.2 C) (Oral)  Ht 5\' 4"  (1.626 m)  Wt 171 lb 1.9 oz (77.62 kg)  BMI 29.36 kg/m2  SpO2 95%  Physical Exam  Physical Exam  Constitutional: She is oriented to person, place, and time and well-developed, well-nourished, and in no distress. No distress.  HENT:  Head: Normocephalic and atraumatic.  Eyes: Conjunctivae are normal.  Neck: Neck supple. No thyromegaly present.  Cardiovascular: Normal rate, regular rhythm and normal heart sounds.   No murmur heard. Pulmonary/Chest: Effort normal. She has wheezes.  LLL wheeze/rhonchi  Abdominal: Soft. Bowel sounds are normal. She exhibits no distension and no mass.  Musculoskeletal: She exhibits no edema.  Lymphadenopathy:    She has no cervical adenopathy.  Neurological: She is alert and oriented to person, place, and time.  Skin: Skin is warm and dry. No rash noted. She is not diaphoretic.  Psychiatric: Memory, affect and judgment normal.    Lab Results  Component Value Date   TSH 1.12 12/17/2012   Lab Results  Component Value Date   WBC 6.6 12/17/2012   HGB 14.8 12/17/2012   HCT 43.7 12/17/2012   MCV 91.2 12/17/2012   PLT 241.0 12/17/2012   Lab Results  Component Value Date   CREATININE 0.9 12/17/2012   BUN 21 12/17/2012   NA 137 12/17/2012   K 4.0 12/17/2012   CL 104 12/17/2012   CO2 25 12/17/2012   Lab Results  Component Value Date   ALT 34 12/17/2012   AST 27 12/17/2012   ALKPHOS 49 12/17/2012   BILITOT 0.6 12/17/2012   Lab Results  Component Value Date   CHOL 199 12/17/2012   Lab Results  Component Value Date   HDL 80.10 12/17/2012   Lab Results  Component Value Date   LDLCALC  110* 12/17/2012   Lab Results  Component Value Date   TRIG 47.0 12/17/2012   Lab Results  Component Value Date   CHOLHDL 2 12/17/2012     Assessment & Plan  Acute bronchitis Influenza POCT negative. Started on antibiotics, increase rest and  hydration. Start Mucinex and probiotics. Report if no improvement in symptoms. Given cough syrup for each bedtime use when necessary  HYPERTENSION Mild elevation with acute illness, encouraged to avoid OTC cough and cold preps  DIABETES MELLITUS, CONTROLLED, WITHOUT COMPLICATIONS Sugars trendin gup towards 150 with illness, encouraged to cut down more on carbs until feeling better and report if numbers remain hi

## 2013-04-08 ENCOUNTER — Encounter: Payer: Self-pay | Admitting: Internal Medicine

## 2013-04-24 ENCOUNTER — Other Ambulatory Visit: Payer: Self-pay | Admitting: Internal Medicine

## 2013-04-24 NOTE — Telephone Encounter (Signed)
Rx sent to the pharmacy by e-script.//AB/CMA 

## 2013-06-02 ENCOUNTER — Encounter: Payer: Self-pay | Admitting: Internal Medicine

## 2013-06-03 ENCOUNTER — Other Ambulatory Visit: Payer: Self-pay | Admitting: Internal Medicine

## 2013-06-03 DIAGNOSIS — E119 Type 2 diabetes mellitus without complications: Secondary | ICD-10-CM

## 2013-06-05 ENCOUNTER — Other Ambulatory Visit (INDEPENDENT_AMBULATORY_CARE_PROVIDER_SITE_OTHER): Payer: BC Managed Care – PPO

## 2013-06-05 DIAGNOSIS — E119 Type 2 diabetes mellitus without complications: Secondary | ICD-10-CM

## 2013-06-05 LAB — HEMOGLOBIN A1C: HEMOGLOBIN A1C: 6.8 % — AB (ref 4.6–6.5)

## 2013-06-05 LAB — MICROALBUMIN / CREATININE URINE RATIO
Creatinine,U: 53.9 mg/dL
Microalb Creat Ratio: 0.2 mg/g (ref 0.0–30.0)
Microalb, Ur: 0.1 mg/dL (ref 0.0–1.9)

## 2013-06-16 ENCOUNTER — Other Ambulatory Visit: Payer: Self-pay | Admitting: Internal Medicine

## 2013-08-06 ENCOUNTER — Encounter: Payer: Self-pay | Admitting: Internal Medicine

## 2013-08-25 ENCOUNTER — Other Ambulatory Visit: Payer: Self-pay | Admitting: Internal Medicine

## 2013-08-31 ENCOUNTER — Other Ambulatory Visit: Payer: Self-pay | Admitting: Internal Medicine

## 2013-09-17 ENCOUNTER — Ambulatory Visit: Payer: Self-pay

## 2013-09-28 ENCOUNTER — Telehealth: Payer: Self-pay

## 2013-09-28 ENCOUNTER — Other Ambulatory Visit: Payer: Self-pay | Admitting: Internal Medicine

## 2013-09-28 DIAGNOSIS — E785 Hyperlipidemia, unspecified: Secondary | ICD-10-CM

## 2013-09-28 DIAGNOSIS — E119 Type 2 diabetes mellitus without complications: Secondary | ICD-10-CM

## 2013-09-28 NOTE — Telephone Encounter (Signed)
Up to twice a day (PMH CVA) Refills OK

## 2013-09-28 NOTE — Telephone Encounter (Signed)
Diabetic Bundle- left a detailed message on vm and ordered labs  Also stated that pt needs to call the [place] office to schedule a nurse visit for BP

## 2013-09-28 NOTE — Telephone Encounter (Signed)
How often is patient to test blood sugar?

## 2013-10-02 ENCOUNTER — Other Ambulatory Visit (INDEPENDENT_AMBULATORY_CARE_PROVIDER_SITE_OTHER): Payer: BC Managed Care – PPO

## 2013-10-02 DIAGNOSIS — E119 Type 2 diabetes mellitus without complications: Secondary | ICD-10-CM

## 2013-10-02 DIAGNOSIS — E785 Hyperlipidemia, unspecified: Secondary | ICD-10-CM

## 2013-10-02 LAB — LIPID PANEL
Cholesterol: 192 mg/dL (ref 0–200)
HDL: 76.6 mg/dL (ref >39.00)
LDL CALC: 108 mg/dL — AB (ref 0–99)
NONHDL: 115.4
Total CHOL/HDL Ratio: 3
Triglycerides: 36 mg/dL (ref 0.0–149.0)
VLDL: 7.2 mg/dL (ref 0.0–40.0)

## 2013-10-02 LAB — HEMOGLOBIN A1C: HEMOGLOBIN A1C: 6.6 % — AB (ref 4.6–6.5)

## 2013-10-05 ENCOUNTER — Encounter: Payer: Self-pay | Admitting: Internal Medicine

## 2013-10-27 ENCOUNTER — Encounter: Payer: Self-pay | Admitting: Internal Medicine

## 2013-11-26 ENCOUNTER — Encounter: Payer: Self-pay | Admitting: Internal Medicine

## 2013-12-31 ENCOUNTER — Other Ambulatory Visit: Payer: Self-pay | Admitting: Internal Medicine

## 2014-01-18 ENCOUNTER — Encounter: Payer: Self-pay | Admitting: Family Medicine

## 2014-01-21 ENCOUNTER — Other Ambulatory Visit: Payer: Self-pay | Admitting: Internal Medicine

## 2014-02-09 ENCOUNTER — Other Ambulatory Visit: Payer: Self-pay | Admitting: Internal Medicine

## 2014-02-09 ENCOUNTER — Encounter: Payer: Self-pay | Admitting: Internal Medicine

## 2014-02-09 DIAGNOSIS — Z0000 Encounter for general adult medical examination without abnormal findings: Secondary | ICD-10-CM

## 2014-02-10 ENCOUNTER — Other Ambulatory Visit (INDEPENDENT_AMBULATORY_CARE_PROVIDER_SITE_OTHER): Payer: BC Managed Care – PPO

## 2014-02-10 DIAGNOSIS — Z0000 Encounter for general adult medical examination without abnormal findings: Secondary | ICD-10-CM

## 2014-02-10 DIAGNOSIS — Z0189 Encounter for other specified special examinations: Secondary | ICD-10-CM

## 2014-02-10 LAB — HEPATIC FUNCTION PANEL
ALK PHOS: 59 U/L (ref 39–117)
ALT: 21 U/L (ref 0–35)
AST: 19 U/L (ref 0–37)
Albumin: 4 g/dL (ref 3.5–5.2)
BILIRUBIN DIRECT: 0.1 mg/dL (ref 0.0–0.3)
BILIRUBIN TOTAL: 0.6 mg/dL (ref 0.2–1.2)
TOTAL PROTEIN: 6.8 g/dL (ref 6.0–8.3)

## 2014-02-10 LAB — BASIC METABOLIC PANEL
BUN: 25 mg/dL — ABNORMAL HIGH (ref 6–23)
CO2: 25 mEq/L (ref 19–32)
Calcium: 9.4 mg/dL (ref 8.4–10.5)
Chloride: 100 mEq/L (ref 96–112)
Creatinine, Ser: 0.9 mg/dL (ref 0.4–1.2)
GFR: 69.65 mL/min (ref >60.00)
Glucose, Bld: 150 mg/dL — ABNORMAL HIGH (ref 70–99)
POTASSIUM: 3.8 meq/L (ref 3.5–5.1)
SODIUM: 135 meq/L (ref 135–145)

## 2014-02-10 LAB — CBC WITH DIFFERENTIAL/PLATELET
BASOS PCT: 0.5 % (ref 0.0–3.0)
Basophils Absolute: 0 10*3/uL (ref 0.0–0.1)
Eosinophils Absolute: 0.6 10*3/uL (ref 0.0–0.7)
Eosinophils Relative: 9.5 % — ABNORMAL HIGH (ref 0.0–5.0)
HCT: 43.7 % (ref 36.0–46.0)
Hemoglobin: 14.4 g/dL (ref 12.0–15.0)
LYMPHS PCT: 28.1 % (ref 12.0–46.0)
Lymphs Abs: 1.9 10*3/uL (ref 0.7–4.0)
MCHC: 32.9 g/dL (ref 30.0–36.0)
MCV: 92.7 fl (ref 78.0–100.0)
MONO ABS: 0.4 10*3/uL (ref 0.1–1.0)
Monocytes Relative: 6.1 % (ref 3.0–12.0)
NEUTROS PCT: 55.8 % (ref 43.0–77.0)
Neutro Abs: 3.7 10*3/uL (ref 1.4–7.7)
PLATELETS: 254 10*3/uL (ref 150.0–400.0)
RBC: 4.72 Mil/uL (ref 3.87–5.11)
RDW: 12.6 % (ref 11.5–15.5)
WBC: 6.7 10*3/uL (ref 4.0–10.5)

## 2014-02-10 LAB — MICROALBUMIN / CREATININE URINE RATIO
Creatinine,U: 176 mg/dL
MICROALB/CREAT RATIO: 0.7 mg/g (ref 0.0–30.0)
Microalb, Ur: 1.3 mg/dL (ref 0.0–1.9)

## 2014-02-10 LAB — HEMOGLOBIN A1C: HEMOGLOBIN A1C: 6.8 % — AB (ref 4.6–6.5)

## 2014-02-10 LAB — TSH: TSH: 2.17 u[IU]/mL (ref 0.35–4.50)

## 2014-02-15 ENCOUNTER — Ambulatory Visit (INDEPENDENT_AMBULATORY_CARE_PROVIDER_SITE_OTHER): Payer: BC Managed Care – PPO | Admitting: Internal Medicine

## 2014-02-15 ENCOUNTER — Encounter: Payer: Self-pay | Admitting: Internal Medicine

## 2014-02-15 VITALS — BP 140/70 | HR 105 | Temp 99.0°F | Resp 14 | Ht 64.0 in | Wt 176.2 lb

## 2014-02-15 DIAGNOSIS — Z0000 Encounter for general adult medical examination without abnormal findings: Secondary | ICD-10-CM

## 2014-02-15 DIAGNOSIS — Z86010 Personal history of colonic polyps: Secondary | ICD-10-CM

## 2014-02-15 DIAGNOSIS — Z0189 Encounter for other specified special examinations: Secondary | ICD-10-CM

## 2014-02-15 DIAGNOSIS — E785 Hyperlipidemia, unspecified: Secondary | ICD-10-CM

## 2014-02-15 DIAGNOSIS — E1151 Type 2 diabetes mellitus with diabetic peripheral angiopathy without gangrene: Secondary | ICD-10-CM

## 2014-02-15 DIAGNOSIS — E1159 Type 2 diabetes mellitus with other circulatory complications: Secondary | ICD-10-CM

## 2014-02-15 MED ORDER — LISINOPRIL 40 MG PO TABS: ORAL_TABLET | ORAL | Status: DC

## 2014-02-15 MED ORDER — METFORMIN HCL 1000 MG PO TABS: ORAL_TABLET | ORAL | Status: DC

## 2014-02-15 NOTE — Progress Notes (Signed)
Pre visit review using our clinic review tool, if applicable. No additional management support is needed unless otherwise documented below in the visit note. 

## 2014-02-15 NOTE — Progress Notes (Signed)
Subjective:    Patient ID: Alexandra Fletcher, female    DOB: 04-19-1950, 63 y.o.   MRN: [id number]  HPI  She is here for a physical;acute issues include occasional dysphagia.   She describes dysphagia partially twice a week. Both her sons have similar issues. One son has been diagnosed with eosinophilic esophagitis.  Her eosinophil count has risen lately.  She is compliant with her medicines and denies adverse effects  She exercises approximate 5 hours a week without cardiopulmonary symptoms.  Her blood sugars average 112 fasting. 2 hours after a meal the average 103. Ophth exam UTD.  Blood pressure averages 116/66.     Review of Systems   Chest pain, palpitations, tachycardia, exertional dyspnea, paroxysmal nocturnal dyspnea, claudication or edema are absent.  Unexplained weight loss, abdominal pain, significant dyspepsia,  melena, rectal bleeding, or persistently small caliber stools are denied.  Polyuria, polyphagia, polydipsia absent. There is no blurred vision, double vision, or loss of vision.  Also denied are numbness, tingling, or burning of the extremities. No nonhealing skin lesions present. Weight is stable.      Objective:   Physical Exam Gen.: Healthy and well-nourished in appearance. Alert, appropriate and cooperative throughout exam. Appears younger than stated age  Head: Normocephalic without obvious abnormalities  Eyes: No corneal or conjunctival inflammation noted. Pupils equal round reactive to light and accommodation. Extraocular motion intact.  Ears: External  ear exam reveals no significant lesions or deformities. Canals clear .TMs normal. Hearing is grossly normal bilaterally. Nose: External nasal exam reveals no deformity or inflammation. Nasal mucosa are pink and moist. No lesions or exudates noted.   Mouth: Oral mucosa and oropharynx reveal no lesions or exudates. Teeth in good repair. Neck: No deformities, masses, or tenderness noted. Range of motion  slightly decreased. Thyroid small. Lungs: Normal respiratory effort; chest expands symmetrically. Lungs are clear to auscultation without rales, wheezes, or increased work of breathing. Heart: Normal rate and rhythm. Normal S1 and S2. No gallop, click, or rub. No murmur. Repeat pulse rate 92. Abdomen: Bowel sounds normal; abdomen soft and nontender. No masses, organomegaly or hernias noted. Genitalia: as per Gyn                                  Musculoskeletal/extremities: No deformity or scoliosis noted of  the thoracic or lumbar spine.  No clubbing, cyanosis, edema, or significant extremity  deformity noted.  Range of motion normal . Tone & strength normal. Hand joints  reveal mild  DJD DIP changes.  Fingernail / toenail health good. Minor crepitus of knees  Able to lie down & sit up w/o help.  Negative SLR bilaterally Vascular: Carotid, radial artery, dorsalis pedis and  posterior tibial pulses are full and equal. No bruits present. Neurologic: Alert and oriented x3. Deep tendon reflexes symmetrical and normal.  Gait normal .     Skin: Intact without suspicious lesions or rashes. Lymph: No cervical, axillary lymphadenopathy present. Psych: Mood and affect are normal. Normally interactive  Assessment & Plan:  #1 comprehensive physical exam; no acute findings #2 dysphagia; R/ O eosinophilic esophagitis  Plan: see Orders  & Recommendations

## 2014-02-15 NOTE — Patient Instructions (Signed)
Reflux of gastric acid may be asymptomatic as this may occur mainly during sleep.The triggers for reflux  include stress; the "aspirin family" ; alcohol; peppermint; and caffeine (coffee, tea, cola, and chocolate). The aspirin family would include aspirin and the nonsteroidal agents such as ibuprofen &  Naproxen. Tylenol would not cause reflux. If having symptoms ; food & drink should be avoided for @ least 2 hours before going to bed.   I recommend an GI consultation to determine optimal therapy; please inform me if you have a physician preference.

## 2014-02-20 ENCOUNTER — Other Ambulatory Visit: Payer: Self-pay | Admitting: Internal Medicine

## 2014-04-08 ENCOUNTER — Other Ambulatory Visit: Payer: Self-pay | Admitting: Internal Medicine

## 2014-05-18 ENCOUNTER — Other Ambulatory Visit: Payer: Self-pay | Admitting: Internal Medicine

## 2014-05-20 ENCOUNTER — Encounter: Payer: Self-pay | Admitting: Internal Medicine

## 2014-05-20 MED ORDER — LISINOPRIL 40 MG PO TABS: 40.0000 mg | ORAL_TABLET | Freq: Every day | ORAL | Status: DC

## 2014-05-20 NOTE — Telephone Encounter (Signed)
PA has been completed on line.

## 2014-05-24 MED ORDER — LINAGLIPTIN 5 MG PO TABS: 5.0000 mg | ORAL_TABLET | Freq: Every day | ORAL | Status: DC

## 2014-05-24 NOTE — Addendum Note (Signed)
Addended by: Roma Schanz R on: 05/24/2014 03:17 PM   Modules accepted: Orders, Medications

## 2014-05-24 NOTE — Telephone Encounter (Signed)
PA for Januvia has been denied. Patient needs to try and fail either Nesina(alogliptin), Tradjenta(linagliptin), or Onglyza(saxafliptin)

## 2014-05-27 ENCOUNTER — Encounter: Payer: Self-pay | Admitting: Internal Medicine

## 2014-05-27 MED ORDER — LINAGLIPTIN 5 MG PO TABS: 5.0000 mg | ORAL_TABLET | Freq: Every day | ORAL | Status: DC

## 2014-05-27 NOTE — Telephone Encounter (Signed)
-----   Message from Hendricks Limes, MD sent at 05/27/2014  3:16 PM EST ----- Tradjenta 5 mg qd #90 to her pharmacy please

## 2014-05-27 NOTE — Telephone Encounter (Signed)
[facility] was routed electronically to patient's pharmacy on 05/24/14 #30. New script for #90 has been routed

## 2014-06-19 ENCOUNTER — Other Ambulatory Visit: Payer: Self-pay | Admitting: Internal Medicine

## 2014-06-22 ENCOUNTER — Telehealth: Payer: Self-pay | Admitting: Internal Medicine

## 2014-06-22 ENCOUNTER — Other Ambulatory Visit: Payer: Self-pay | Admitting: Internal Medicine

## 2014-06-22 MED ORDER — LINAGLIPTIN 5 MG PO TABS: 5.0000 mg | ORAL_TABLET | Freq: Every day | ORAL | Status: DC

## 2014-06-22 NOTE — Telephone Encounter (Signed)
For trajenta, [facility] did not get the 05/27/2014 escribe for x90. They did get the 05/24/2014 for x30. Asking that it is sent back in to reflect the x90 for trajenta

## 2014-07-12 ENCOUNTER — Encounter: Payer: Self-pay | Admitting: Internal Medicine

## 2014-07-12 ENCOUNTER — Other Ambulatory Visit: Payer: Self-pay | Admitting: Internal Medicine

## 2014-07-12 DIAGNOSIS — E1151 Type 2 diabetes mellitus with diabetic peripheral angiopathy without gangrene: Secondary | ICD-10-CM

## 2014-08-12 ENCOUNTER — Other Ambulatory Visit: Payer: Self-pay | Admitting: Internal Medicine

## 2014-08-12 NOTE — Telephone Encounter (Signed)
amaryl rx sent to pharm

## 2014-08-24 ENCOUNTER — Telehealth: Payer: Self-pay | Admitting: Internal Medicine

## 2014-08-24 ENCOUNTER — Other Ambulatory Visit: Payer: Self-pay | Admitting: Internal Medicine

## 2014-08-24 DIAGNOSIS — E785 Hyperlipidemia, unspecified: Secondary | ICD-10-CM

## 2014-08-24 DIAGNOSIS — E1151 Type 2 diabetes mellitus with diabetic peripheral angiopathy without gangrene: Secondary | ICD-10-CM

## 2014-08-24 NOTE — Telephone Encounter (Signed)
Pt came by this morning wanting to get labs done based on MyChart messages between you and her in the past.  She thinks she at least needs to get an A1C ordered and possibly some other lab orders put in.  Please advise.  Pt will continue to check MyChart for your orders/directives.

## 2014-09-01 ENCOUNTER — Encounter: Payer: Self-pay | Admitting: Internal Medicine

## 2014-09-03 ENCOUNTER — Other Ambulatory Visit (INDEPENDENT_AMBULATORY_CARE_PROVIDER_SITE_OTHER): Payer: 59

## 2014-09-03 DIAGNOSIS — E785 Hyperlipidemia, unspecified: Secondary | ICD-10-CM

## 2014-09-03 DIAGNOSIS — E1151 Type 2 diabetes mellitus with diabetic peripheral angiopathy without gangrene: Secondary | ICD-10-CM

## 2014-09-03 DIAGNOSIS — E1159 Type 2 diabetes mellitus with other circulatory complications: Secondary | ICD-10-CM | POA: Diagnosis not present

## 2014-09-03 LAB — LIPID PANEL
Cholesterol: 184 mg/dL (ref 0–200)
HDL: 72.6 mg/dL (ref >39.00)
LDL Cholesterol: 101 mg/dL — ABNORMAL HIGH (ref 0–99)
NONHDL: 111.4
Total CHOL/HDL Ratio: 3
Triglycerides: 51 mg/dL (ref 0.0–149.0)
VLDL: 10.2 mg/dL (ref 0.0–40.0)

## 2014-09-03 LAB — MICROALBUMIN / CREATININE URINE RATIO
Creatinine,U: 87.8 mg/dL
MICROALB/CREAT RATIO: 1 mg/g (ref 0.0–30.0)
Microalb, Ur: 0.9 mg/dL (ref 0.0–1.9)

## 2014-09-03 LAB — HEMOGLOBIN A1C: Hgb A1c MFr Bld: 6.6 % — ABNORMAL HIGH (ref 4.6–6.5)

## 2014-10-20 ENCOUNTER — Encounter: Payer: Self-pay | Admitting: Internal Medicine

## 2014-10-20 NOTE — Telephone Encounter (Signed)
Please advise 

## 2014-11-11 ENCOUNTER — Other Ambulatory Visit: Payer: Self-pay

## 2014-11-11 ENCOUNTER — Other Ambulatory Visit: Payer: Self-pay | Admitting: Internal Medicine

## 2014-11-11 MED ORDER — AMLODIPINE BESYLATE 5 MG PO TABS: ORAL_TABLET | ORAL | Status: DC

## 2014-12-16 LAB — HM DIABETES EYE EXAM

## 2014-12-19 ENCOUNTER — Encounter: Payer: Self-pay | Admitting: Internal Medicine

## 2014-12-19 DIAGNOSIS — E11319 Type 2 diabetes mellitus with unspecified diabetic retinopathy without macular edema: Secondary | ICD-10-CM | POA: Insufficient documentation

## 2014-12-20 ENCOUNTER — Other Ambulatory Visit: Payer: Self-pay | Admitting: Internal Medicine

## 2015-02-12 ENCOUNTER — Other Ambulatory Visit: Payer: Self-pay | Admitting: Internal Medicine

## 2015-02-16 ENCOUNTER — Encounter: Payer: Self-pay | Admitting: Internal Medicine

## 2015-02-16 ENCOUNTER — Other Ambulatory Visit (INDEPENDENT_AMBULATORY_CARE_PROVIDER_SITE_OTHER): Payer: 59

## 2015-02-16 DIAGNOSIS — E1149 Type 2 diabetes mellitus with other diabetic neurological complication: Secondary | ICD-10-CM | POA: Diagnosis not present

## 2015-02-16 DIAGNOSIS — I10 Essential (primary) hypertension: Secondary | ICD-10-CM | POA: Diagnosis not present

## 2015-02-16 DIAGNOSIS — E785 Hyperlipidemia, unspecified: Secondary | ICD-10-CM | POA: Diagnosis not present

## 2015-02-16 DIAGNOSIS — D721 Eosinophilia: Secondary | ICD-10-CM

## 2015-02-16 DIAGNOSIS — Z0000 Encounter for general adult medical examination without abnormal findings: Secondary | ICD-10-CM | POA: Diagnosis not present

## 2015-02-16 DIAGNOSIS — R1314 Dysphagia, pharyngoesophageal phase: Secondary | ICD-10-CM

## 2015-02-16 LAB — CBC WITH DIFFERENTIAL/PLATELET
Basophils Absolute: 0 10*3/uL (ref 0.0–0.1)
Basophils Relative: 0.5 % (ref 0.0–3.0)
EOS PCT: 11.2 % — AB (ref 0.0–5.0)
Eosinophils Absolute: 0.7 10*3/uL (ref 0.0–0.7)
HEMATOCRIT: 44.1 % (ref 36.0–46.0)
Hemoglobin: 14.6 g/dL (ref 12.0–15.0)
LYMPHS ABS: 2.1 10*3/uL (ref 0.7–4.0)
Lymphocytes Relative: 32.6 % (ref 12.0–46.0)
MCHC: 33.2 g/dL (ref 30.0–36.0)
MCV: 92.1 fl (ref 78.0–100.0)
MONOS PCT: 7 % (ref 3.0–12.0)
Monocytes Absolute: 0.4 10*3/uL (ref 0.1–1.0)
NEUTROS ABS: 3.1 10*3/uL (ref 1.4–7.7)
NEUTROS PCT: 48.7 % (ref 43.0–77.0)
Platelets: 224 10*3/uL (ref 150.0–400.0)
RBC: 4.79 Mil/uL (ref 3.87–5.11)
RDW: 13.1 % (ref 11.5–15.5)
WBC: 6.4 10*3/uL (ref 4.0–10.5)

## 2015-02-16 LAB — HEPATIC FUNCTION PANEL
ALBUMIN: 4.3 g/dL (ref 3.5–5.2)
ALT: 25 U/L (ref 0–35)
AST: 19 U/L (ref 0–37)
Alkaline Phosphatase: 58 U/L (ref 39–117)
Bilirubin, Direct: 0.1 mg/dL (ref 0.0–0.3)
TOTAL PROTEIN: 7.1 g/dL (ref 6.0–8.3)
Total Bilirubin: 0.6 mg/dL (ref 0.2–1.2)

## 2015-02-16 LAB — URINALYSIS
BILIRUBIN URINE: NEGATIVE
HGB URINE DIPSTICK: NEGATIVE
KETONES UR: NEGATIVE
Leukocytes, UA: NEGATIVE
Nitrite: NEGATIVE
Specific Gravity, Urine: 1.01 (ref 1.000–1.030)
TOTAL PROTEIN, URINE-UPE24: NEGATIVE
URINE GLUCOSE: NEGATIVE
UROBILINOGEN UA: 0.2 (ref 0.0–1.0)
pH: 5.5 (ref 5.0–8.0)

## 2015-02-16 LAB — LIPID PANEL
CHOL/HDL RATIO: 3
Cholesterol: 197 mg/dL (ref 0–200)
HDL: 73.1 mg/dL (ref >39.00)
LDL Cholesterol: 112 mg/dL — ABNORMAL HIGH (ref 0–99)
NONHDL: 123.85
Triglycerides: 57 mg/dL (ref 0.0–149.0)
VLDL: 11.4 mg/dL (ref 0.0–40.0)

## 2015-02-16 LAB — BASIC METABOLIC PANEL
BUN: 25 mg/dL — ABNORMAL HIGH (ref 6–23)
CHLORIDE: 100 meq/L (ref 96–112)
CO2: 29 mEq/L (ref 19–32)
CREATININE: 0.93 mg/dL (ref 0.40–1.20)
Calcium: 9.5 mg/dL (ref 8.4–10.5)
GFR: 64.28 mL/min (ref >60.00)
Glucose, Bld: 137 mg/dL — ABNORMAL HIGH (ref 70–99)
Potassium: 3.8 mEq/L (ref 3.5–5.1)
Sodium: 137 mEq/L (ref 135–145)

## 2015-02-16 LAB — HEMOGLOBIN A1C: HEMOGLOBIN A1C: 7 % — AB (ref 4.6–6.5)

## 2015-02-16 NOTE — Telephone Encounter (Signed)
Pt was in the lab today and wanted to have her labs done today.   CPE labs entered.

## 2015-02-21 ENCOUNTER — Ambulatory Visit (INDEPENDENT_AMBULATORY_CARE_PROVIDER_SITE_OTHER): Payer: Medicare Other | Admitting: Internal Medicine

## 2015-02-21 ENCOUNTER — Encounter: Payer: Self-pay | Admitting: Internal Medicine

## 2015-02-21 ENCOUNTER — Telehealth: Payer: Self-pay | Admitting: Internal Medicine

## 2015-02-21 ENCOUNTER — Other Ambulatory Visit: Payer: Medicare Other

## 2015-02-21 ENCOUNTER — Other Ambulatory Visit: Payer: Self-pay

## 2015-02-21 VITALS — BP 138/72 | HR 95 | Temp 97.9°F | Resp 14 | Wt 180.0 lb

## 2015-02-21 DIAGNOSIS — E785 Hyperlipidemia, unspecified: Secondary | ICD-10-CM

## 2015-02-21 DIAGNOSIS — I10 Essential (primary) hypertension: Secondary | ICD-10-CM | POA: Diagnosis not present

## 2015-02-21 DIAGNOSIS — Z0000 Encounter for general adult medical examination without abnormal findings: Secondary | ICD-10-CM

## 2015-02-21 DIAGNOSIS — Z1159 Encounter for screening for other viral diseases: Secondary | ICD-10-CM | POA: Diagnosis not present

## 2015-02-21 DIAGNOSIS — D721 Eosinophilia, unspecified: Secondary | ICD-10-CM

## 2015-02-21 DIAGNOSIS — E1149 Type 2 diabetes mellitus with other diabetic neurological complication: Secondary | ICD-10-CM | POA: Diagnosis not present

## 2015-02-21 DIAGNOSIS — Z23 Encounter for immunization: Secondary | ICD-10-CM | POA: Diagnosis not present

## 2015-02-21 DIAGNOSIS — R1314 Dysphagia, pharyngoesophageal phase: Secondary | ICD-10-CM | POA: Insufficient documentation

## 2015-02-21 MED ORDER — GLUCOSE BLOOD VI STRP: ORAL_STRIP | Status: DC

## 2015-02-21 MED ORDER — LISINOPRIL 40 MG PO TABS: 40.0000 mg | ORAL_TABLET | Freq: Every day | ORAL | Status: DC

## 2015-02-21 MED ORDER — METFORMIN HCL 1000 MG PO TABS: ORAL_TABLET | ORAL | Status: DC

## 2015-02-21 MED ORDER — AMLODIPINE BESYLATE 5 MG PO TABS: ORAL_TABLET | ORAL | Status: DC

## 2015-02-21 NOTE — Assessment & Plan Note (Signed)
No change but see new medication recommendations under DM

## 2015-02-21 NOTE — Patient Instructions (Addendum)
Hold Lisinopril and monitor BP and possible tongue swelling. The ACE-I agent may be contributing to both issues. Minimal Blood Pressure Goal= AVERAGE < 140/90;  Ideal is an AVERAGE < 135/85. This AVERAGE should be calculated from @ least 5-7 BP readings taken @ different times of day on different days of week. You should not respond to isolated BP readings , but rather the AVERAGE for that week .Please bring your  blood pressure cuff to office visits to verify that it is reliable.It  can also be checked against the blood pressure device at the pharmacy. Finger or wrist cuffs are not dependable; an arm cuff is.Carvedilol would be considered if BP is up off Lisinopril.  If the dysphagia or swallowing difficulty is progressive or unimproved off Lisinopril; upper endoscopy is indicated. Please consider referral to an Allergist to rule out any food allergies as a component to your dysphagia and eosinophilia.  Please do not use Q-tips as this simply packs the wax down against he eardrum. Should wax build up occur, please put 2-3 drops of mineral oil in the ear at night and cover the canal with a  cotton ball.In the morning fill the canal with hydrogen peroxide & leave  for 10-15 minutes.Following this shower and use the thinnest washrag available to wick out the wax.

## 2015-02-21 NOTE — Assessment & Plan Note (Signed)
Hold ACE-I and monitor dysphagia and tongue issues Carvedilol if BP up off ACE-I

## 2015-02-21 NOTE — Assessment & Plan Note (Addendum)
Asked to consider SGLT-2 ( Empagliflozin) in place of Tradjenta & generiic Amaryl

## 2015-02-21 NOTE — Assessment & Plan Note (Signed)
Recommend referral to Dr Orvil Feil to R/O food allergy component

## 2015-02-21 NOTE — Assessment & Plan Note (Signed)
Blood pressure goals reviewed. BMET reviewed 

## 2015-02-21 NOTE — Telephone Encounter (Signed)
Sent new rx with requested quantities, and advised pharmacy

## 2015-02-21 NOTE — Telephone Encounter (Signed)
[facility] called wanting to know if the prescription for amLODipine (NORVASC) 5 MG tablet [id number] can be changed to 90 day and  glucose blood (ONE TOUCH ULTRA TEST) test strip [id number] for 100 since she has come in for her appt

## 2015-02-21 NOTE — Progress Notes (Signed)
   Subjective:    Patient ID: Alexandra Fletcher, female    DOB: 05-17-1950, 64 y.o.   MRN: [id number]  HPI The patient is here to assess status of active health conditions.  PMH, FH, & Social History reviewed & updated.Change in [place] as recorded; specifically her second son now has intermittent dysphagia. The other son has documented eosinophilic esophagitis.  She is compliant with her medicines with no definite adverse effects. She questions some tongue swelling in am upon awakening. She is on an ACE-I.  She is on a heart healthy diet. 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 She also ran a 5K recently. She has no associated cardio pulmonary symptoms  Her lipids indicate excellent dietary intervention with a triglycerides of 57. LDL was 112. Her advanced cholesterol panel had revealed that her goal is less than 110; ideally less than 80.  Colonoscopy is up-to-date. She has had colon polyps; but colonoscopy negative in 2008. She does have occasional dysphasia with chicken and meat. She's not had an upper endoscopy to date. Her serial labs have shown progressive rise in her eosinophil count.   Ophthalmologic exams completed in September of this year revealed mild stable retinopathy. . Review of Systems Extrinsic symptoms of itchy, watery eyes, sneezing, or definite angioedema are denied. There is no significant cough, sputum production, wheezing,or  paroxysmal nocturnal dyspnea. Chest pain, palpitations, tachycardia, exertional dyspnea, paroxysmal nocturnal dyspnea, claudication or edema are absent. No unexplained weight loss, abdominal pain, significant dyspepsia, melena, rectal bleeding, or persistently small caliber stools. Dysuria, pyuria, hematuria, frequency, nocturia or polyuria are denied. Change in hair, skin, nails denied. No bowel changes of constipation or diarrhea. No intolerance to heat or cold. She denies  numbness, tingling, burning in her feet. She has no nonhealing skin lesions. She denies excessive thirst or hunger.     Objective:   Physical Exam  Pertinent or positive findings include: There is wax impaction bilaterally. Thyroid is small. There is slight accentuation of the upper thoracic curvature. An S4 is present. Repeat pulse was 95. She has slight crepitus of knees. She has DIP osteoarthritic changes of the hands.   General appearance :adequately nourished; in no distress.  Eyes: No conjunctival inflammation or scleral icterus is present.  Oral exam:  Lips and gums are healthy appearing.There is no oropharyngeal erythema or exudate noted. Dental hygiene is good.  Heart:  Regular rhythm. S1 and S2 normal without gallop, murmur, click, rub or other extra sounds    Lungs:Chest clear to auscultation; no wheezes, rhonchi,rales ,or rubs present.No increased work of breathing.   Abdomen: bowel sounds normal, soft and non-tender without masses, organomegaly or hernias noted.  No guarding or rebound.   Vascular : all pulses equal ; no bruits present.  Skin:Warm & dry.  Intact without suspicious lesions or rashes ; no tenting or jaundice   Lymphatic: No lymphadenopathy is noted about the head, neck, axilla   Neuro: Strength, tone & DTRs normal     Assessment & Plan:  See Current Assessment & Plan in Problem List under specific Diagnosis

## 2015-02-21 NOTE — Progress Notes (Signed)
Pre visit review using our clinic review tool, if applicable. No additional management support is needed unless otherwise documented below in the visit note. 

## 2015-02-22 LAB — HEPATITIS C ANTIBODY: HCV AB: NEGATIVE

## 2015-03-17 ENCOUNTER — Encounter: Payer: Self-pay | Admitting: Internal Medicine

## 2015-04-06 ENCOUNTER — Encounter: Payer: Self-pay | Admitting: Internal Medicine

## 2015-04-19 ENCOUNTER — Other Ambulatory Visit: Payer: Self-pay | Admitting: Internal Medicine

## 2015-04-19 ENCOUNTER — Telehealth: Payer: Self-pay | Admitting: Emergency Medicine

## 2015-04-19 ENCOUNTER — Telehealth: Payer: Self-pay | Admitting: *Deleted

## 2015-04-19 ENCOUNTER — Encounter: Payer: Self-pay | Admitting: Internal Medicine

## 2015-04-19 MED ORDER — LINAGLIPTIN 5 MG PO TABS: ORAL_TABLET | ORAL | Status: DC

## 2015-04-19 NOTE — Telephone Encounter (Signed)
Left msg on triage stating we are trying to get quantity change to 90 on pt Trajenta. The refill keeps coming back for # 30. Pt is needing 90 day to be covered. Resent for 90 day...Johny Chess

## 2015-04-19 NOTE — Telephone Encounter (Signed)
LVM for pt to call back and schedule appt to establish with Dr Quay Burow. Pt may schedule around June for a 6 mo follow-up. Once appt is made, refill for Glimepiride will be sent in

## 2015-04-20 ENCOUNTER — Other Ambulatory Visit: Payer: Self-pay | Admitting: Internal Medicine

## 2015-04-20 MED ORDER — LINAGLIPTIN 5 MG PO TABS: ORAL_TABLET | ORAL | Status: DC

## 2015-05-23 ENCOUNTER — Encounter: Payer: Self-pay | Admitting: Family Medicine

## 2015-05-23 ENCOUNTER — Ambulatory Visit (INDEPENDENT_AMBULATORY_CARE_PROVIDER_SITE_OTHER): Payer: PPO | Admitting: Family Medicine

## 2015-05-23 VITALS — BP 170/90 | HR 115 | Temp 98.3°F | Wt 180.0 lb

## 2015-05-23 DIAGNOSIS — I10 Essential (primary) hypertension: Secondary | ICD-10-CM

## 2015-05-23 MED ORDER — CARVEDILOL 3.125 MG PO TABS: 3.1250 mg | ORAL_TABLET | Freq: Two times a day (BID) | ORAL | Status: DC

## 2015-05-23 NOTE — Progress Notes (Signed)
Subjective:    Patient ID: Alexandra Fletcher, female    DOB: 06-28-1950, 65 y.o.   MRN: [id number]  HPI  Alexandra Fletcher is a 65 year old female who presents today with a concern about elevated blood pressure readings. Today, she presents with a BP of 180/90 and a follow up BP of 170/90. In December her PCP  discontinued her lisinopril due to swelling in her tongue and difficulty swallowing foods. Also, there is some concern about a possible allergic reaction to the ARBs.  Today, she reports BP readings at home to be increasing from 135/77 to 160/90, to readings noted today. Associated symptom of dull HA noted today without visual disturbances. Only visual disturbance noted today is a "floater" that is monitored by her opthamologist.  She denies chest pain, arm pain, palpitations, SOB, and edema in extremities. Pertinent history of diabetes, eosinophilia, and hyperlipidemia. Remote history of acute right arterial ischemic stroke is noted also. 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   Review of Systems  Constitutional: Negative for fever, chills and fatigue.  Respiratory: Negative for cough, chest tightness, shortness of breath and wheezing.   Cardiovascular: Negative for chest pain, palpitations and leg swelling.  Gastrointestinal: Negative for nausea, abdominal pain, diarrhea, constipation and blood in stool.  Endocrine: Negative for polydipsia, polyphagia and polyuria.  Genitourinary: Negative for dysuria, urgency, frequency, hematuria and flank pain.  Musculoskeletal: Negative for myalgias, joint swelling and arthralgias.  Skin: Negative for pallor.  Neurological: Negative for dizziness, light-headedness and headaches.       She denies numbness or tingling in extremities   Past Medical History  Diagnosis Date  . Diabetes mellitus   . Hypertension   .  Hyperlipidemia   . Adenomatous polyp 2003     Dr Olevia Perches  . Heart murmur   . Cerebral thrombosis with cerebral infarction [facility]) 2002    diagnosis needs confirmed by review of [facility]    Social History   Social History  . Marital Status: Married    Spouse Name: N/A  . Number of Children: 2  . Years of Education: N/A   Occupational History  . Swim Trainer    Social History Main Topics  . Smoking status: Never Smoker   . Smokeless tobacco: Never Used  . Alcohol Use: 4.2 oz/week    7 Glasses of wine per week  . Drug Use: No  . Sexual Activity: Yes    Birth Control/ Protection: Post-menopausal   Other Topics Concern  . Not on file   Social History Narrative   1 cup of coffee daily     Past Surgical History  Procedure Laterality Date  . Knee arthroscopy w/ meniscal repair  2008     Dr Adella Nissen knee  . Dilation and curettage of uterus      X2  . Thumb surgery  1983    Left; post skiing accident in [place]  . Colonoscopy with polypectomy  2003    Dr Olevia Perches; rectal polyps  . Ovarian cysts removed       Dr Warnell Forester  . Colonoscopy  2008    neg    Family History  Problem Relation Age of Onset  . Liver disease Mother     ? malignancy after Hepatitis  . Diabetes Father   . Heart attack Father     CBAG @ 89  .  Multiple myeloma Father   . Diabetes Brother     Type 1 DM  . Heart attack Brother 30  . Colon cancer Neg Hx   . Stroke Maternal Aunt     > 65    Allergies  Allergen Reactions  . Pioglitazone     REACTION: weight gain & ankle edema    Current Outpatient Prescriptions on File Prior to Visit  Medication Sig Dispense Refill  . amLODipine (NORVASC) 5 MG tablet TAKE ONE (1) TABLET BY MOUTH EVERY DAY 90 tablet 2  . aspirin (BABY ASPIRIN) 81 MG chewable tablet Chew 81 mg by mouth daily.      . Cholecalciferol 1000 UNITS capsule Take 1,000 Units by mouth daily.      Marland Kitchen glimepiride (AMARYL) 1 MG tablet TAKE ONE-HALF TABLET BY MOUTH ONCE DAILY 45  tablet 5  . glucose blood (ONE TOUCH ULTRA TEST) test strip USE TO TEST BLOOD GLUCOSE 1-2 TIMES A DAY 25 each 3  . glucose blood (ONE TOUCH ULTRA TEST) test strip Use as instructed 100 each 3  . linagliptin (TRADJENTA) 5 MG TABS tablet TAKE ONE (1) TABLET BY MOUTH EVERY DAY 90 tablet 0  . metFORMIN (GLUCOPHAGE) 1000 MG tablet TAKE ONE (1) TABLET BY MOUTH TWO (2) TIMES DAILY 180 tablet 2  . Omega-3 Fatty Acids (FISH OIL) 1000 MG CAPS Take by mouth daily.       No current facility-administered medications on file prior to visit.    BP 170/90 mmHg  Pulse 115  Temp(Src) 98.3 F (36.8 C) (Oral)  Wt 180 lb (81.647 kg)       Objective:   Physical Exam  Constitutional: She is oriented to person, place, and time. She appears well-developed and well-nourished.  Eyes: Pupils are equal, round, and reactive to light.  Cardiovascular: Normal rate and regular rhythm.   No murmur heard. S4 noted  Pulmonary/Chest: Effort normal and breath sounds normal. She has no wheezes. She has no rales.  Abdominal: Soft. Bowel sounds are normal. She exhibits no distension. There is no tenderness.  Lymphadenopathy:    She has no cervical adenopathy.  Neurological: She is alert and oriented to person, place, and time.  Skin: Skin is warm and dry. No rash noted.  Psychiatric: She has a normal mood and affect. Her behavior is normal.        Assessment & Plan:  1. Essential hypertension Recent d/c of lisinopril for suspected allergic reaction of tongue swelling and difficulty swallowing noted. Some concern for allergic reaction to ARB classification noted from history. Low dose Coreg will be added to current norvasc dose.  - carvedilol (COREG) 3.125 MG tablet; Take 1 tablet (3.125 mg total) by mouth 2 (two) times daily with a meal.  Dispense: 60 tablet; Refill: 3   Advised patient to continue monitoring and documenting BP at home and also monitor pulse and report a rate <60. Discussed some possible adverse  effects such as dizziness or fatigue. Advised her to RTC for further evaluation prior to scheduled follow up visit if she notes any of these symptoms or any new symptoms appear.  She will follow up with previously scheduled upcoming establish care visit. Patient voiced understanding and agreed with plan.

## 2015-05-23 NOTE — Patient Instructions (Signed)
It was a pleasure to see you today! Please take Coreg 2 times/day. The lowest dose has been prescribed for you and divided into 2 daily doses.  Please continue to monitor your blood pressure and bring readings with you to your upcoming visit. If you notice any dizziness, increased fatigue or heart rate <60, please contact clinic for further evaluation.

## 2015-05-23 NOTE — Progress Notes (Signed)
Pre visit review using our clinic review tool, if applicable. No additional management support is needed unless otherwise documented below in the visit note. 

## 2015-05-25 ENCOUNTER — Other Ambulatory Visit: Payer: Self-pay

## 2015-05-25 DIAGNOSIS — Z1231 Encounter for screening mammogram for malignant neoplasm of breast: Secondary | ICD-10-CM

## 2015-06-02 ENCOUNTER — Ambulatory Visit: Payer: PPO | Admitting: Family Medicine

## 2015-06-14 ENCOUNTER — Ambulatory Visit
Admission: RE | Admit: 2015-06-14 | Discharge: 2015-06-14 | Disposition: A | Discharge status: Home / Self Care | Payer: PPO | Source: Ambulatory Visit

## 2015-06-14 DIAGNOSIS — Z1231 Encounter for screening mammogram for malignant neoplasm of breast: Secondary | ICD-10-CM

## 2015-06-16 ENCOUNTER — Encounter: Payer: Self-pay | Admitting: Family Medicine

## 2015-06-16 ENCOUNTER — Ambulatory Visit (INDEPENDENT_AMBULATORY_CARE_PROVIDER_SITE_OTHER): Payer: PPO | Admitting: Family Medicine

## 2015-06-16 VITALS — BP 170/98 | HR 100 | Temp 97.8°F | Ht 63.78 in | Wt 180.5 lb

## 2015-06-16 DIAGNOSIS — E1169 Type 2 diabetes mellitus with other specified complication: Secondary | ICD-10-CM

## 2015-06-16 DIAGNOSIS — I10 Essential (primary) hypertension: Secondary | ICD-10-CM

## 2015-06-16 LAB — CBC WITH DIFFERENTIAL/PLATELET
BASOS ABS: 0 10*3/uL (ref 0.0–0.1)
Basophils Relative: 0.4 % (ref 0.0–3.0)
EOS ABS: 0.7 10*3/uL (ref 0.0–0.7)
Eosinophils Relative: 8.5 % — ABNORMAL HIGH (ref 0.0–5.0)
HCT: 43.9 % (ref 36.0–46.0)
HEMOGLOBIN: 14.7 g/dL (ref 12.0–15.0)
Lymphocytes Relative: 17.8 % (ref 12.0–46.0)
Lymphs Abs: 1.5 10*3/uL (ref 0.7–4.0)
MCHC: 33.5 g/dL (ref 30.0–36.0)
MCV: 89.4 fl (ref 78.0–100.0)
MONO ABS: 0.4 10*3/uL (ref 0.1–1.0)
Monocytes Relative: 4.9 % (ref 3.0–12.0)
Neutro Abs: 5.6 10*3/uL (ref 1.4–7.7)
Neutrophils Relative %: 68.4 % (ref 43.0–77.0)
Platelets: 267 10*3/uL (ref 150.0–400.0)
RBC: 4.91 Mil/uL (ref 3.87–5.11)
RDW: 13.4 % (ref 11.5–15.5)
WBC: 8.2 10*3/uL (ref 4.0–10.5)

## 2015-06-16 LAB — HEMOGLOBIN A1C: HEMOGLOBIN A1C: 7.1 % — AB (ref 4.6–6.5)

## 2015-06-16 MED ORDER — CARVEDILOL 6.25 MG PO TABS: 6.2500 mg | ORAL_TABLET | Freq: Two times a day (BID) | ORAL | Status: DC

## 2015-06-16 NOTE — Progress Notes (Signed)
Pre visit review using our clinic review tool, if applicable. No additional management support is needed unless otherwise documented below in the visit note. 

## 2015-06-16 NOTE — Progress Notes (Addendum)
Subjective:    Patient ID: Alexandra Fletcher, female    DOB: [birth year], 65 y.o.   MRN: [id number]  HPI  Alexandra Fletcher is a 65 year old female who presents today for a BP and medication follow up and follow up for Type 2 Diabetes. She is currently taking amlodipine 5 mg daily and carvedilol 3.125 mg BID and BP averages are elevated with retake of BP being noted as  180/100, 170/98, 168/98 and pulse rate running in the 90s with 100 as the highest rate. She is not experiencing any adverse SEs with this medication and denies chest pain, palpitations, SOB, HAs, nosebleeds, numbness or tingling. Today her BP is elevated and averages documented by patient are elevated since her last visit. She has taken her medication today. Currently, she follows and maintains a heart healthy diet and also exercises with swimming up to 9 hours/week and plays tennis on weekly basis without cardiopulmonary symptoms.  Type 2 Diabetes She currently checks her blood sugars BID and notes the following averages: The past 7 day average is noted as 154 with the 30 day average for fasting noted as 155. She denies any episodes of hypoglycemia. Reports taking 0.49m of glimepiride in the morning with her metformin and has noted taking 0.064mfor a "few days" which has improved her morning fasting glucose levels. She currently takes linagliptin daily without any adverse events and states that she has seen little change in fasting glucose levels with initiation of this medication.    Review of Systems  Constitutional: Negative for fever, chills and fatigue.  HENT: Negative for congestion, rhinorrhea, sinus pressure and sneezing.   Respiratory: Negative for cough, shortness of breath and wheezing.   Cardiovascular: Negative for chest pain, palpitations and leg swelling.  Endocrine: Negative for polydipsia, polyphagia and polyuria.  Genitourinary: Negative for dysuria, urgency, hematuria and flank pain.  Musculoskeletal: Negative  for myalgias and arthralgias.  Skin: Negative for rash.  Neurological: Negative for dizziness, light-headedness and headaches.  Hematological: Does not bruise/bleed easily.  Psychiatric/Behavioral:       Denies depressed or anxious mood today   Past Medical History  Diagnosis Date  . Diabetes mellitus   . Hypertension   . Hyperlipidemia   . Adenomatous polyp 2003     Dr BrOlevia Perches. Heart murmur   . [facility]    [facility]    Social History   Social History  . Marital Status: Married    Spouse Name: N/A  . Number of Children: 2  . Years of Education: N/A   Occupational History  . Swim Trainer    Social History Main Topics  . Smoking status: Never Smoker   . Smokeless tobacco: Never Used  . Alcohol Use: 4.2 oz/week    7 Glasses of wine per week  . Drug Use: No  . Sexual Activity: Yes    Birth Control/ Protection: Post-menopausal   Other Topics Concern  . Not on file   Social History Narrative   1 cup of coffee daily     Past Surgical History  Procedure Laterality Date  . Knee arthroscopy w/ meniscal repair  2008     Dr WhAdella Nissennee  . Dilation and curettage of uterus      X2  . Thumb surgery  1983    [place]pectomy  2003    Dr BrOlevia Perches  rectal polyps  . Ovarian cysts removed       Dr Warnell Forester  . Colonoscopy  2008    neg    Family History  Problem Relation Age of Onset  . Liver disease Mother     ? malignancy after Hepatitis  . Diabetes Father   . Heart attack Father     CBAG @ 87  . Multiple myeloma Father   . Diabetes Brother     Type 1 DM  . Heart attack Brother 20  . Colon cancer Neg Hx   . Stroke Maternal Aunt     > 65    Allergies  Allergen Reactions  . Pioglitazone     REACTION: weight gain & ankle edema    Current Outpatient Prescriptions on File Prior to Visit  Medication Sig Dispense  Refill  . amLODipine (NORVASC) 5 MG tablet TAKE ONE (1) TABLET BY MOUTH EVERY DAY 90 tablet 2  . aspirin (BABY ASPIRIN) 81 MG chewable tablet Chew 81 mg by mouth daily.      . Cholecalciferol 1000 UNITS capsule Take 1,000 Units by mouth daily.      Marland Kitchen glimepiride (AMARYL) 1 MG tablet TAKE ONE-HALF TABLET BY MOUTH ONCE DAILY 45 tablet 5  . glucose blood (ONE TOUCH ULTRA TEST) test strip USE TO TEST BLOOD GLUCOSE 1-2 TIMES A DAY 25 each 3  . glucose blood (ONE TOUCH ULTRA TEST) test strip Use as instructed 100 each 3  . linagliptin (TRADJENTA) 5 MG TABS tablet TAKE ONE (1) TABLET BY MOUTH EVERY DAY 90 tablet 0  . metFORMIN (GLUCOPHAGE) 1000 MG tablet TAKE ONE (1) TABLET BY MOUTH TWO (2) TIMES DAILY 180 tablet 2  . Omega-3 Fatty Acids (FISH OIL) 1000 MG CAPS Take by mouth daily.       No current facility-administered medications on file prior to visit.    BP 170/98 mmHg  Pulse 100  Temp(Src) 97.8 F (36.6 C) (Oral)  Ht 5' 3.78" (1.62 m)  Wt 180 lb 8 oz (81.874 kg)  BMI 31.20 kg/m2       Objective:   Physical Exam  Constitutional: She is oriented to person, place, and time. She appears well-developed and well-nourished.  Eyes: Pupils are equal, round, and reactive to light.  Cardiovascular: Normal rate and regular rhythm.   Pulmonary/Chest: Effort normal and breath sounds normal. She has no wheezes. She has no rales.  Abdominal: Soft. Bowel sounds are normal. There is no tenderness.  Lymphadenopathy:    She has no cervical adenopathy.  Neurological: She is alert and oriented to person, place, and time. Coordination normal.  Skin: Skin is warm and dry. No rash noted.  Psychiatric: She has a normal mood and affect. Her behavior is normal.       Assessment & Plan:  1. Essential hypertension Increase dose of carvedilol 6.68m BID. Patient advised to monitor BP daily and record averages and report readings >140/90. Advised patient to follow up in one month for evaluation of medication  changes. - CBC with Differential/Platelet - carvedilol (COREG) 6.25 MG tablet; Take 1 tablet (6.25 mg total) by mouth 2 (two) times daily with a meal.  Dispense: 60 tablet; Refill: 3  2. Type 2 diabetes mellitus with other specified complication (HCC) - Hemoglobin A1c  Patient stated that she has taken 0.05 mg of glimepiride with her evening dose of metformin and has noticed that her blood sugars for fasting the next morning are improved. She checks her blood sugars at least  2 times/day and at times will check this even 3 times/day. She denies any episodes of hypoglycemia. Advised patient to document blood sugars for one week and report numbers with this change in her dose. Also advised patient that this medication can cause hypoglycemia and she voiced understanding. If no episodes of hypoglycemia are noted with this change, she may continue with this regimen.  Discussed with patient that lab results will be reported to her within one week or sooner if needed. She agreed with plan to communicate BP and blood sugar readings. Follow up in one month for BP evaluation with medication change.

## 2015-06-16 NOTE — Patient Instructions (Signed)
Please go to the lab before you leave and results will be called to you within one week or sooner if necessary. Continue monitoring your BP and Blood sugars at home and report averages with you next visit.  Take 2 tablets of your Coreg twice daily which will provide a dose of 6.25mg , then start the new prescription which of 6.25mg  tablets one tab twice a day.  Monitor Blood sugars for one week after starting 1/2 tablet of glimepiride with your evening Metformin dose.  So great to see you today! Please follow up in 1 month for evaluation of BP and Blood sugar with medication changes.

## 2015-06-27 DIAGNOSIS — H35033 Hypertensive retinopathy, bilateral: Secondary | ICD-10-CM | POA: Diagnosis not present

## 2015-06-27 DIAGNOSIS — H524 Presbyopia: Secondary | ICD-10-CM | POA: Diagnosis not present

## 2015-06-27 DIAGNOSIS — H5203 Hypermetropia, bilateral: Secondary | ICD-10-CM | POA: Diagnosis not present

## 2015-06-27 DIAGNOSIS — H52223 Regular astigmatism, bilateral: Secondary | ICD-10-CM | POA: Diagnosis not present

## 2015-06-27 DIAGNOSIS — I10 Essential (primary) hypertension: Secondary | ICD-10-CM | POA: Diagnosis not present

## 2015-06-29 ENCOUNTER — Other Ambulatory Visit: Payer: Self-pay | Admitting: Family Medicine

## 2015-06-29 MED ORDER — GLIMEPIRIDE 1 MG PO TABS: ORAL_TABLET | ORAL | Status: DC

## 2015-07-28 ENCOUNTER — Encounter: Payer: Self-pay | Admitting: Family Medicine

## 2015-07-28 ENCOUNTER — Ambulatory Visit (INDEPENDENT_AMBULATORY_CARE_PROVIDER_SITE_OTHER): Payer: PPO | Admitting: Family Medicine

## 2015-07-28 VITALS — BP 142/88 | HR 90 | Temp 98.6°F | Wt 179.0 lb

## 2015-07-28 DIAGNOSIS — L659 Nonscarring hair loss, unspecified: Secondary | ICD-10-CM | POA: Diagnosis not present

## 2015-07-28 DIAGNOSIS — I10 Essential (primary) hypertension: Secondary | ICD-10-CM

## 2015-07-28 DIAGNOSIS — E1169 Type 2 diabetes mellitus with other specified complication: Secondary | ICD-10-CM

## 2015-07-28 NOTE — Progress Notes (Signed)
Subjective:    Patient ID: Alexandra Fletcher, female    DOB: 1950/05/12, 65 y.o.   MRN: [id number]  HPI   [name] is a 65 year old female who presents today for a BP and medication follow up after increasing her carvedilol dose  and follow up for Type 2 Diabetes. She is currently taking amlodipine 5 mg daily and carvedilol  6.25 mg BID. She monitors her BP at home and the average of readings is noted as 129/67. Her heart rate ranges in the 80s-90s. She is not experiencing any adverse SEs with this medication and denies chest pain, palpitations, SOB, HAs, nosebleeds, numbness or tingling. Today her BP is elevated but retake is noted as 142/88. Marland Kitchen She has taken her medication today. Currently, she follows and maintains a heart healthy diet and also exercises with swimming up to 9 hours/week and plays tennis on weekly basis without cardiopulmonary symptoms.  Type 2 Diabetes She currently checks her blood sugars BID and notes the following averages: The past 7 day average is noted as 125 with the 30 day average for fasting noted as 131. She denies any episodes of hypoglycemia. Reports taking 0.56m of glimepiride BID after A1C had increased and she has noticed improvement in her fasting glucose averages. She currently takes linagliptin daily without any adverse events and states that she has seen little change in fasting glucose levels with initiation of this medication.  Current treatment with Metformin 2G/day, glimepiride 0.597mBID, Tradjenta 34m20may.   She reports noticing that her hair is thinning. This is noticed after washing her hair in the shower. She has not used any new shampoos or other hair care products. Denies changes in skin or nails. Reports that this has been occurring for "quite some time" which she notes is >6 months.  She has not tried any treatment at home. 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 No seasonal trigger  noted.  Review of Systems  Constitutional: Negative for fever, chills and fatigue.  Eyes: Negative for visual disturbance.  Respiratory: Negative for cough, chest tightness, shortness of breath and wheezing.   Cardiovascular: Negative for chest pain, palpitations and leg swelling.  Gastrointestinal: Negative for nausea, vomiting, abdominal pain and diarrhea.  Endocrine: Negative for polydipsia, polyphagia and polyuria.  Genitourinary: Negative for dysuria, hematuria and flank pain.  Musculoskeletal: Negative for myalgias and arthralgias.  Skin: Negative for rash.  Neurological: Negative for dizziness and light-headedness.  Psychiatric/Behavioral:       Denies depressed or anxious mood   Past Medical History  Diagnosis Date  . Diabetes mellitus   . Hypertension   . Hyperlipidemia   . Adenomatous polyp 2003     Dr BroOlevia Perches Heart murmur   . [facility]    diagnosis needs confirmed [facility]     Social History   Social History  . Marital Status: Married    Spouse Name: N/A  . Number of Children: 2  . Years of Education: N/A   Occupational History  . Swim Trainer    Social History Main Topics  . Smoking status: Never Smoker   . Smokeless tobacco: Never Used  . Alcohol Use: 4.2 oz/week    7 Glasses of wine per week  . Drug Use: No  . Sexual Activity: Yes    Birth Control/ Protection: Post-menopausal   Other Topics Concern  . Not on file  Social History Narrative   1 cup of coffee daily     Past Surgical History  Procedure Laterality Date  . Knee arthroscopy w/ meniscal repair  2008     Dr Adella Nissen knee  . Dilation and curettage of uterus      X2  . Thumb surgery  1983    Left; post skiing accident in [place]  . Colonoscopy with polypectomy  2003    Dr Olevia Perches; rectal polyps  . Ovarian cysts removed       Dr Warnell Forester  . Colonoscopy  2008    neg    Family History  Problem Relation Age of  Onset  . Liver disease Mother     ? malignancy after Hepatitis  . Diabetes Father   . Heart attack Father     CBAG @ 75  . Multiple myeloma Father   . Diabetes Brother     Type 1 DM  . Heart attack Brother 75  . Colon cancer Neg Hx   . Stroke Maternal Aunt     > 65    Allergies  Allergen Reactions  . Pioglitazone     REACTION: weight gain & ankle edema    Current Outpatient Prescriptions on File Prior to Visit  Medication Sig Dispense Refill  . amLODipine (NORVASC) 5 MG tablet TAKE ONE (1) TABLET BY MOUTH EVERY DAY 90 tablet 2  . aspirin (BABY ASPIRIN) 81 MG chewable tablet Chew 81 mg by mouth daily.      . carvedilol (COREG) 6.25 MG tablet Take 1 tablet (6.25 mg total) by mouth 2 (two) times daily with a meal. 60 tablet 3  . Cholecalciferol 1000 UNITS capsule Take 1,000 Units by mouth daily.      Marland Kitchen glimepiride (AMARYL) 1 MG tablet Take 0.19m twice a day 90 tablet 3  . glucose blood (ONE TOUCH ULTRA TEST) test strip USE TO TEST BLOOD GLUCOSE 1-2 TIMES A DAY (Patient taking differently: USE TO TEST BLOOD GLUCOSE 2-3 TIMES A DAY) 25 each 3  . linagliptin (TRADJENTA) 5 MG TABS tablet TAKE ONE (1) TABLET BY MOUTH EVERY DAY 90 tablet 0  . metFORMIN (GLUCOPHAGE) 1000 MG tablet TAKE ONE (1) TABLET BY MOUTH TWO (2) TIMES DAILY 180 tablet 2  . Omega-3 Fatty Acids (FISH OIL) 1000 MG CAPS Take by mouth daily.       No current facility-administered medications on file prior to visit.    BP 142/88 mmHg  Pulse 90  Temp(Src) 98.6 F (37 C) (Oral)  Wt 179 lb (81.194 kg)       Objective:   Physical Exam  Constitutional: She is oriented to person, place, and time. She appears well-developed and well-nourished.  HENT:  Right Ear: Hearing normal. Tympanic membrane is not scarred.  Left Ear: Hearing normal. Tympanic membrane is not scarred.  Nose: No rhinorrhea.  Mouth/Throat: Mucous membranes are normal. No oropharyngeal exudate or posterior oropharyngeal erythema.  No abnormal hair  distribution noted upon exam. No lesions or flaky areas on scalp. Negative hair pull test    Eyes: Pupils are equal, round, and reactive to light. No scleral icterus.  Neck: Neck supple.  Cardiovascular: Normal rate, regular rhythm, normal heart sounds and intact distal pulses.   Pulmonary/Chest: Effort normal and breath sounds normal. She has no rales.  Abdominal: Soft. Bowel sounds are normal. There is no tenderness.  Musculoskeletal: She exhibits no edema.  Lymphadenopathy:    She has no cervical adenopathy.  Neurological: She is alert and  oriented to person, place, and time.  Skin: Skin is warm and dry. No rash noted.  Psychiatric: She has a normal mood and affect. Her behavior is normal. Judgment and thought content normal.       Assessment & Plan:  1. Hypertension, essential Monitor BP at home for 1 week and readings will be monitored through MyChart. Continue carvedilol and amlodipine as ordered. If a BP medication adjustment occurs, a follow up appointment in 1 month will be scheduled. Patient has been provided instructions regarding proper measurement of BP and verifying reliability of cuff. She is adherent with medication, diet, and exercise regimen.  2. Type 2 diabetes mellitus with other specified complication (HCC) Continue metformin, glimepiride, and tradjenta as ordered. Also continue daily glucose checking or more frequent if needed. A1C will be collected in June. Discussed adverse effect of hypoglycemia with patient. She is able to state s/sx of hypoglycemia.  - Microalbumin / creatinine urine ratio; Future - Hemoglobin A1c; Future  3. Hair thinning Advised to monitor hair changes and document any triggers such as increased exposure to chlorine. Also, discussed monitoring skin or nail changes and GI disturbances. If these are noted, she will schedule a follow up appointment. Suspect this minor hair loss can be associated with exposure to chlorine, menopausal changes, or  possibly thyroid function.  - TSH; Future  Follow up in 3 months for evaluation of BP and diabetes or sooner if medication adjustments occur. Patient agreed with plan. Lab results will be sent to patient and any recommendations will be noted at that time.  Delano Metz, FNP-C

## 2015-07-28 NOTE — Progress Notes (Signed)
Pre visit review using our clinic review tool, if applicable. No additional management support is needed unless otherwise documented below in the visit note. 

## 2015-07-28 NOTE — Patient Instructions (Signed)
It was great to see you today! Please go to the lab by the end of June for lab work.  Monitor BP daily for one week and we can monitor this through [place]. If medications are adjusted, a follow up appointment will be scheduled. Please schedule a follow up appointment in 3 months or earlier as needed Minimal Blood Pressure Goal= AVERAGE < 140/90; Ideal is an AVERAGE < 135/85. This AVERAGE should be calculated from @ least 5-7 BP readings taken @ different times of day on different days of week. You should not respond to isolated BP readings , but rather the AVERAGE for that week .Please bring your blood pressure cuff to office visits to verify that it is reliable.It can also be checked against the blood pressure device at the pharmacy.   We have ordered labs or studies at this visit. It can take up to 1-2 weeks for results and processing. IF results require follow up or explanation, we will call you with instructions. Clinically stable results will be released to your [facility]. If you have not heard from [place] or cannot find your results in [facility] in 2 weeks please contact our office at [phone].

## 2015-08-02 DIAGNOSIS — H16141 Punctate keratitis, right eye: Secondary | ICD-10-CM | POA: Diagnosis not present

## 2015-08-22 ENCOUNTER — Ambulatory Visit: Payer: Medicare Other | Admitting: Internal Medicine

## 2015-09-16 ENCOUNTER — Encounter: Payer: Self-pay | Admitting: Family Medicine

## 2015-09-21 ENCOUNTER — Other Ambulatory Visit (INDEPENDENT_AMBULATORY_CARE_PROVIDER_SITE_OTHER): Payer: PPO

## 2015-09-21 DIAGNOSIS — E1169 Type 2 diabetes mellitus with other specified complication: Secondary | ICD-10-CM

## 2015-09-21 DIAGNOSIS — L659 Nonscarring hair loss, unspecified: Secondary | ICD-10-CM

## 2015-09-21 LAB — MICROALBUMIN / CREATININE URINE RATIO
Creatinine,U: 181 mg/dL
MICROALB UR: 3.3 mg/dL — AB (ref 0.0–1.9)
MICROALB/CREAT RATIO: 1.8 mg/g (ref 0.0–30.0)

## 2015-10-06 ENCOUNTER — Telehealth: Payer: Self-pay | Admitting: Emergency Medicine

## 2015-10-06 ENCOUNTER — Other Ambulatory Visit (INDEPENDENT_AMBULATORY_CARE_PROVIDER_SITE_OTHER): Payer: PPO

## 2015-10-06 DIAGNOSIS — E119 Type 2 diabetes mellitus without complications: Secondary | ICD-10-CM | POA: Diagnosis not present

## 2015-10-06 DIAGNOSIS — R946 Abnormal results of thyroid function studies: Secondary | ICD-10-CM | POA: Diagnosis not present

## 2015-10-06 LAB — HEMOGLOBIN A1C: HEMOGLOBIN A1C: 6.5 % (ref 4.6–6.5)

## 2015-10-06 LAB — TSH: TSH: 1.53 u[IU]/mL (ref 0.35–4.50)

## 2015-10-06 NOTE — Telephone Encounter (Signed)
Spoke with pt informed her of lab results. Pt verbalized understanding.

## 2015-10-07 ENCOUNTER — Other Ambulatory Visit: Payer: Self-pay | Admitting: *Deleted

## 2015-10-07 ENCOUNTER — Other Ambulatory Visit: Payer: Self-pay | Admitting: Family Medicine

## 2015-10-07 MED ORDER — CARVEDILOL 6.25 MG PO TABS: ORAL_TABLET | ORAL | Status: DC

## 2015-10-07 NOTE — Telephone Encounter (Signed)
Sent new script for 90 day supply in to [facility].

## 2015-10-18 DIAGNOSIS — L814 Other melanin hyperpigmentation: Secondary | ICD-10-CM | POA: Diagnosis not present

## 2015-10-18 DIAGNOSIS — D485 Neoplasm of uncertain behavior of skin: Secondary | ICD-10-CM | POA: Diagnosis not present

## 2015-10-18 DIAGNOSIS — L821 Other seborrheic keratosis: Secondary | ICD-10-CM | POA: Diagnosis not present

## 2015-10-18 DIAGNOSIS — L570 Actinic keratosis: Secondary | ICD-10-CM | POA: Diagnosis not present

## 2015-10-18 DIAGNOSIS — D1801 Hemangioma of skin and subcutaneous tissue: Secondary | ICD-10-CM | POA: Diagnosis not present

## 2015-10-18 DIAGNOSIS — L820 Inflamed seborrheic keratosis: Secondary | ICD-10-CM | POA: Diagnosis not present

## 2015-11-23 ENCOUNTER — Other Ambulatory Visit: Payer: Self-pay | Admitting: Family Medicine

## 2015-12-07 ENCOUNTER — Encounter: Payer: Self-pay | Admitting: Family Medicine

## 2015-12-12 ENCOUNTER — Encounter: Payer: Self-pay | Admitting: Family Medicine

## 2015-12-15 ENCOUNTER — Other Ambulatory Visit: Payer: Self-pay | Admitting: Family Medicine

## 2015-12-27 ENCOUNTER — Other Ambulatory Visit: Payer: Self-pay | Admitting: Family Medicine

## 2016-01-06 ENCOUNTER — Encounter: Payer: Self-pay | Admitting: Internal Medicine

## 2016-01-09 ENCOUNTER — Other Ambulatory Visit: Payer: Self-pay | Admitting: Internal Medicine

## 2016-01-09 DIAGNOSIS — H25813 Combined forms of age-related cataract, bilateral: Secondary | ICD-10-CM | POA: Diagnosis not present

## 2016-01-09 NOTE — Telephone Encounter (Signed)
Refill for Tradjenta complete. Patient has a CPE in 02/2016

## 2016-01-10 NOTE — Telephone Encounter (Signed)
Called and spoke with pt. Pt states that everything is fine and thanks for following up.

## 2016-01-12 ENCOUNTER — Other Ambulatory Visit: Payer: PPO

## 2016-01-13 ENCOUNTER — Ambulatory Visit (INDEPENDENT_AMBULATORY_CARE_PROVIDER_SITE_OTHER): Payer: PPO

## 2016-01-13 DIAGNOSIS — Z23 Encounter for immunization: Secondary | ICD-10-CM | POA: Diagnosis not present

## 2016-01-19 DIAGNOSIS — H2513 Age-related nuclear cataract, bilateral: Secondary | ICD-10-CM | POA: Diagnosis not present

## 2016-01-19 DIAGNOSIS — H35412 Lattice degeneration of retina, left eye: Secondary | ICD-10-CM | POA: Diagnosis not present

## 2016-01-19 DIAGNOSIS — E113293 Type 2 diabetes mellitus with mild nonproliferative diabetic retinopathy without macular edema, bilateral: Secondary | ICD-10-CM | POA: Diagnosis not present

## 2016-01-19 LAB — HM DIABETES EYE EXAM

## 2016-02-03 ENCOUNTER — Other Ambulatory Visit (INDEPENDENT_AMBULATORY_CARE_PROVIDER_SITE_OTHER): Payer: PPO

## 2016-02-03 DIAGNOSIS — Z0000 Encounter for general adult medical examination without abnormal findings: Secondary | ICD-10-CM

## 2016-02-03 LAB — TSH: TSH: 1.76 u[IU]/mL (ref 0.35–4.50)

## 2016-02-03 LAB — CBC WITH DIFFERENTIAL/PLATELET
BASOS PCT: 0.5 % (ref 0.0–3.0)
Basophils Absolute: 0 10*3/uL (ref 0.0–0.1)
EOS ABS: 0.9 10*3/uL — AB (ref 0.0–0.7)
EOS PCT: 12 % — AB (ref 0.0–5.0)
HEMATOCRIT: 42.5 % (ref 36.0–46.0)
HEMOGLOBIN: 14.3 g/dL (ref 12.0–15.0)
LYMPHS PCT: 24.4 % (ref 12.0–46.0)
Lymphs Abs: 1.7 10*3/uL (ref 0.7–4.0)
MCHC: 33.7 g/dL (ref 30.0–36.0)
MCV: 90.5 fl (ref 78.0–100.0)
Monocytes Absolute: 0.4 10*3/uL (ref 0.1–1.0)
Monocytes Relative: 6.1 % (ref 3.0–12.0)
NEUTROS ABS: 4 10*3/uL (ref 1.4–7.7)
Neutrophils Relative %: 57 % (ref 43.0–77.0)
PLATELETS: 247 10*3/uL (ref 150.0–400.0)
RBC: 4.69 Mil/uL (ref 3.87–5.11)
RDW: 13.2 % (ref 11.5–15.5)
WBC: 7.1 10*3/uL (ref 4.0–10.5)

## 2016-02-03 LAB — MICROALBUMIN / CREATININE URINE RATIO
Creatinine,U: 154.7 mg/dL
MICROALB UR: 3.4 mg/dL — AB (ref 0.0–1.9)
MICROALB/CREAT RATIO: 2.2 mg/g (ref 0.0–30.0)

## 2016-02-03 LAB — HEMOGLOBIN A1C: Hgb A1c MFr Bld: 6.8 % — ABNORMAL HIGH (ref 4.6–6.5)

## 2016-02-03 LAB — LIPID PANEL
CHOLESTEROL: 189 mg/dL (ref 0–200)
HDL: 69.8 mg/dL (ref >39.00)
LDL Cholesterol: 108 mg/dL — ABNORMAL HIGH (ref 0–99)
NonHDL: 118.77
TRIGLYCERIDES: 54 mg/dL (ref 0.0–149.0)
Total CHOL/HDL Ratio: 3
VLDL: 10.8 mg/dL (ref 0.0–40.0)

## 2016-02-03 LAB — POC URINALSYSI DIPSTICK (AUTOMATED)
Bilirubin, UA: NEGATIVE
Blood, UA: NEGATIVE
Glucose, UA: NEGATIVE
KETONES UA: NEGATIVE
NITRITE UA: NEGATIVE
PH UA: 6
Spec Grav, UA: 1.02
UROBILINOGEN UA: 0.2

## 2016-02-03 LAB — BASIC METABOLIC PANEL
BUN: 23 mg/dL (ref 6–23)
CHLORIDE: 101 meq/L (ref 96–112)
CO2: 28 meq/L (ref 19–32)
CREATININE: 0.85 mg/dL (ref 0.40–1.20)
Calcium: 9.4 mg/dL (ref 8.4–10.5)
GFR: 71.1 mL/min (ref >60.00)
Glucose, Bld: 170 mg/dL — ABNORMAL HIGH (ref 70–99)
POTASSIUM: 3.8 meq/L (ref 3.5–5.1)
Sodium: 138 mEq/L (ref 135–145)

## 2016-02-03 LAB — HEPATIC FUNCTION PANEL
ALBUMIN: 4.2 g/dL (ref 3.5–5.2)
ALT: 19 U/L (ref 0–35)
AST: 14 U/L (ref 0–37)
Alkaline Phosphatase: 63 U/L (ref 39–117)
BILIRUBIN TOTAL: 0.7 mg/dL (ref 0.2–1.2)
Bilirubin, Direct: 0.1 mg/dL (ref 0.0–0.3)
TOTAL PROTEIN: 6.8 g/dL (ref 6.0–8.3)

## 2016-02-05 ENCOUNTER — Encounter: Payer: Self-pay | Admitting: Family Medicine

## 2016-02-23 ENCOUNTER — Encounter: Payer: Self-pay | Admitting: Family Medicine

## 2016-02-23 ENCOUNTER — Ambulatory Visit (INDEPENDENT_AMBULATORY_CARE_PROVIDER_SITE_OTHER): Payer: PPO | Admitting: Family Medicine

## 2016-02-23 VITALS — BP 158/88 | HR 60 | Temp 98.0°F | Ht 63.0 in | Wt 178.8 lb

## 2016-02-23 DIAGNOSIS — E118 Type 2 diabetes mellitus with unspecified complications: Secondary | ICD-10-CM | POA: Diagnosis not present

## 2016-02-23 DIAGNOSIS — I10 Essential (primary) hypertension: Secondary | ICD-10-CM | POA: Diagnosis not present

## 2016-02-23 DIAGNOSIS — Z0000 Encounter for general adult medical examination without abnormal findings: Secondary | ICD-10-CM | POA: Diagnosis not present

## 2016-02-23 DIAGNOSIS — Z0001 Encounter for general adult medical examination with abnormal findings: Secondary | ICD-10-CM

## 2016-02-23 NOTE — Progress Notes (Signed)
Pre visit review using our clinic review tool, if applicable. No additional management support is needed unless otherwise documented below in the visit note. 

## 2016-02-23 NOTE — Progress Notes (Signed)
Subjective:    Patient ID: Koa Crickmore, female    DOB: 1950-10-18, 65 y.o.   MRN: [id number]  HPI  Ms. Cory is here today for annual examination and review of active health conditions.  She is adherent with her medication regimen without adverse effects. PMH, FH, & Social history reviewed with no changes noted.  She is adherent with her medicines without report of adverse effects.  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 No cardiopulmonary symptoms are present with exercise.  Labs reviewed and she has excellent dietary patterns with triglycerides of 54. LDL was 108.  She is not interested in statin therapy but does take fish oil daily.  Colonoscopy is UTD.  Last colonoscopy in 2008 was negative.   Eye exam completed 01/19/2016 which revealed mild stable retinopathy with no changes. Follow up in one year recommended by ophthalmology  Blood pressure monitoring is as follows:  14 day average 139/72   Blood sugar monitoring is 7 day average of 112 and 14 day average of 128. She denies symptoms of hypoglycemia.  Review of Systems  Constitutional: No fever, chills, significant weight change, fatigue, weakness or night sweats Eyes: No redness, discharge, pain, blurred vision, double vision, or loss of vision ENT/mouth: No nasal congestion, postnasal drainage,epistaxis, purulent discharge, earache, hearing loss, tinnitus ,sore throat , dental pain, or hoarseness   Cardiovascular: no chest pain, palpitations, racing, irregular rhythm, syncope, nausea, sweating, claudication, or edema  Respiratory: No cough, sputum production,hemoptysis,  dyspnea, paroxysmal nocturnal dyspnea, pleuritic chest pain, significant snoring, or  apnea    Gastrointestinal: No heartburn,dysphagia, nausea and vomiting,abdominal pain, change in bowels, anorexia, diarrhea, significant constipation, rectal bleeding, melena,   stool incontinence or jaundice Genitourinary: No dysuria,hematuria, pyuria, frequency, urgency,  incontinence, nocturia, dark urine or flank pain Musculoskeletal: No myalgias or muscle cramping, joint stiffness, joint swelling, joint color change, weakness, or cyanosis Dermatologic: No rash, pruritus, urticaria, or change in color or temperature of skin Neurologic: No headache, vertigo, limb weakness, tremor, gait disturbance, seizures, memory loss, numbness or tingling Psychiatric: No significant anxiety or depression, anhedonia, panic attacks, insomnia, or anorexia Endocrine: No change in hair/skin/ nails, excessive thirst, excessive hunger, excessive urination, or unexplained fatigue Hematologic/lymphatic: No bruising, lymphadenopathy,or  abnormal clotting Allergy/immunology: No itchy/ watery eyes, abnormal sneezing, rhinitis, urticaria ,or angioedema     Objective:   Physical Exam  Physical Exam  Constitutional: She is oriented to person, place, and time. She appears well-developed and well-nourished. No distress.  HENT:  Head: Normocephalic and atraumatic.  Right Ear: Tympanic membrane and ear canal normal.  Left Ear: Tympanic membrane and ear canal normal.  Mouth/Throat: Oropharynx is clear and moist.  Eyes: Pupils are equal, round, and reactive to light. No scleral icterus.  Neck: Normal range of motion. No thyromegaly present.  Cardiovascular: Normal rate and regular rhythm.   No murmur heard. S4 present Pulmonary/Chest: Effort normal and breath sounds normal. No respiratory distress. He has no wheezes. She has no rales. She exhibits no tenderness.  Abdominal: Soft. Bowel sounds are normal. She exhibits no distension and no mass. There is no tenderness. There is no rebound and no guarding.  Musculoskeletal: She exhibits no edema.  Lymphadenopathy:    She has no cervical adenopathy.  Neurological: She is alert and oriented to person, place, and time. She has normal patellar  reflexes. She exhibits normal muscle tone. Coordination normal.  Skin:  Skin is warm and dry.  Psychiatric: She has a normal mood and affect. Her behavior is normal. Judgment and thought content normal.     Assessment & Plan:   1. Encounter for general adult medical examination with abnormal findings 65 y.o. female presenting for annual physical.  Health Maintenance counseling: 1. Anticipatory guidance: Patient counseled regarding regular dental exams, eye exams, wearing seatbelts.  2. Risk factor reduction:  Advised patient of need for regular exercise and diet rich and fruits and vegetables to reduce risk of heart attack and stroke.  3. Immunizations/screenings/ancillary studies Immunization History  Administered Date(s) Administered  . Influenza Split 02/22/2011, 12/03/2011  . Influenza Whole 01/15/2007, 12/17/2007, 03/03/2008, 12/07/2009  . Influenza, High Dose Seasonal PF 01/13/2016  . Influenza,inj,Quad PF,36+ Mos 01/20/2013  . Influenza-Unspecified 12/30/2014  . Pneumococcal Conjugate-13 02/21/2015  . Td 05/21/2002  . Tdap 02/11/2013  . Zoster 01/05/2011   Health Maintenance Due  Topic Date Due  . OPHTHALMOLOGY EXAM  12/16/2015  . FOOT EXAM  02/21/2016  . PNA vac Low Risk Adult (2 of 2 - PPSV23) 02/21/2016    6. Colon cancer screening - After March 2018  - DG Bone Density; Future  2. Type 2 diabetes mellitus with complication, without long-term current use of insulin (HCC) Controlled; Continue Metformin, glimepiride, and tradjenta. A1C in 3 months and she is interested in discontinuing tradjenta and increasing glimepiride if blood sugar will remain controlled. We discussed the differing mechanisms of action of medications and she was advised that if she stops tradjenta, further evaluation and changes in current regimen with glimepiride will need to be established.  We also discussed the risks of hypoglycemia with increasing glimepiride. She has agreed to remain on current  regimen at this time. - Hemoglobin A1c; Future  3. Essential hypertension Controlled; Continue amlodipine and carvedilol. Continue monitoring blood pressure and report readings >150/90.  Follow up in 3 months for A1C and we can discuss use of Tradjenta or increase in glimepiride if she is still interested in this approach.  Delano Metz, FNP-C

## 2016-02-23 NOTE — Patient Instructions (Addendum)
It was a pleasure to see you today! We will check your A1C in 3 months and after review of this number, we will consider a trial of discontinuing Tradjenta. Also, please send your blood pressure readings over the next two weeks and we will review this for any needed changes.  Also, please obtain your mammogram after March 2018.  A Bone density scan has been ordered for you today. You will be contacted regarding these results.  If you have any questions, please do not hesitate to contact me!

## 2016-03-01 DIAGNOSIS — L821 Other seborrheic keratosis: Secondary | ICD-10-CM | POA: Diagnosis not present

## 2016-03-01 DIAGNOSIS — L570 Actinic keratosis: Secondary | ICD-10-CM | POA: Diagnosis not present

## 2016-03-01 DIAGNOSIS — L565 Disseminated superficial actinic porokeratosis (DSAP): Secondary | ICD-10-CM | POA: Diagnosis not present

## 2016-03-21 ENCOUNTER — Other Ambulatory Visit: Payer: Self-pay | Admitting: Family Medicine

## 2016-03-21 ENCOUNTER — Telehealth: Payer: Self-pay

## 2016-03-21 NOTE — Telephone Encounter (Signed)
Received PA request from [facility] for Alexandra Fletcher. PA submitted & is pending. Key:  [id number] Laming

## 2016-03-26 NOTE — Telephone Encounter (Signed)
PA approved, form faxed back to pharmacy. 

## 2016-04-09 ENCOUNTER — Other Ambulatory Visit: Payer: Self-pay

## 2016-04-09 MED ORDER — AMLODIPINE BESYLATE 5 MG PO TABS: ORAL_TABLET | ORAL | 2 refills | Status: DC

## 2016-04-23 ENCOUNTER — Other Ambulatory Visit: Payer: Self-pay | Admitting: Family Medicine

## 2016-04-25 ENCOUNTER — Telehealth: Payer: Self-pay | Admitting: Family Medicine

## 2016-04-25 NOTE — Telephone Encounter (Signed)
Called patient to discuss blood sugar and blood pressure averages with home monitoring. Blood pressure average reported by patient is 135/68 which indicates good control.  Fasting Blood sugar averages reported by patient are as follows:  7 day average:  141 14 day average:  149 30 day average:  146   Current regimen of metformin 1000 mg BID, Tradjenta 5 mg daily, and glimepiride 0.5 mg BID. Patient decided to take 0.5 mg glimepiride BID previously and has not experienced any hypoglycemic episodes.  Discussed with patient that she is due for an A1C in 2 weeks and this result will indicate adjustments in regimen. We discussed that glimepiride does not need to be taken twice daily but can be taken once daily in the morning.  After review of A1C, will consider changing therapy to glipizide with dose adjustment to decrease risk of hypoglycemia.  Patient voiced understanding and agreed with plan.

## 2016-05-21 ENCOUNTER — Other Ambulatory Visit (INDEPENDENT_AMBULATORY_CARE_PROVIDER_SITE_OTHER): Payer: PPO

## 2016-05-21 DIAGNOSIS — E118 Type 2 diabetes mellitus with unspecified complications: Secondary | ICD-10-CM | POA: Diagnosis not present

## 2016-05-21 LAB — HEMOGLOBIN A1C: HEMOGLOBIN A1C: 6.9 % — AB (ref 4.6–6.5)

## 2016-05-23 ENCOUNTER — Other Ambulatory Visit: Payer: Self-pay | Admitting: Family Medicine

## 2016-05-23 MED ORDER — GLIPIZIDE ER 2.5 MG PO TB24: 2.5000 mg | ORAL_TABLET | Freq: Every day | ORAL | 3 refills | Status: DC

## 2016-05-23 NOTE — Progress Notes (Unsigned)
Discontinue glimepiride daily and initiate glipizide 2.5 mg once daily.  Continue to monitor blood sugar and report signs of hypoglycemia. Check A1C in 3 months.

## 2016-05-23 NOTE — Progress Notes (Signed)
Spoke to patient in regards to stop taking the Glimepiride and to start taking Glipizide 2.5 mg once daily, and to continue to monitor blood sugars and report signs of hypoglycemia. Patient was made aware that a recheck will be in 3 months Patient verbalized understanding.

## 2016-06-04 ENCOUNTER — Telehealth: Payer: Self-pay | Admitting: Family Medicine

## 2016-06-04 DIAGNOSIS — E118 Type 2 diabetes mellitus with unspecified complications: Secondary | ICD-10-CM

## 2016-06-04 NOTE — Telephone Encounter (Signed)
Patient contacted me regarding increase in her fasting average blood sugar reading after switching from glimepiride to glipizide 2.5 mg ER daily. This medication change was initiated due to patient breaking glimepiride in half and taking the medication twice daily. Since this change on 05/23/16; average fasting blood sugar readings have increased and average is reported as 149 with high reading noted as 156. She denies any episodes of hypoglycemia with glipizide 2.5 mg ER daily. Contacted patient and recommended increase of glipizide dose to 5.0 mg ER daily and advised close monitoring of blood sugar and report any episodes of hypoglycemia.  Advised that she keep her monitor with her and she also agreed to monitor this twice daily or more if needed. She will report readings this week and will report any episodes of hypoglycemia or blood sugar <70.  New prescription of glipizide 5 mg ER tablet will be ordered for patient. She will take 2 tablets of 2.5 mg ER tomorrow, monitor blood sugar, and report readings.

## 2016-06-05 NOTE — Telephone Encounter (Signed)
Noted  

## 2016-06-19 ENCOUNTER — Other Ambulatory Visit: Payer: Self-pay | Admitting: Family Medicine

## 2016-06-25 ENCOUNTER — Encounter: Payer: Self-pay | Admitting: Family Medicine

## 2016-06-25 ENCOUNTER — Other Ambulatory Visit: Payer: Self-pay | Admitting: Family Medicine

## 2016-06-26 ENCOUNTER — Other Ambulatory Visit: Payer: Self-pay

## 2016-06-26 MED ORDER — GLUCOSE BLOOD VI STRP: ORAL_STRIP | Status: DC

## 2016-06-26 MED ORDER — LINAGLIPTIN 5 MG PO TABS: ORAL_TABLET | ORAL | 1 refills | Status: DC

## 2016-07-11 ENCOUNTER — Other Ambulatory Visit: Payer: Self-pay | Admitting: Family Medicine

## 2016-07-11 ENCOUNTER — Telehealth: Payer: Self-pay | Admitting: Family Medicine

## 2016-07-11 MED ORDER — GLIPIZIDE ER 5 MG PO TB24: 5.0000 mg | ORAL_TABLET | Freq: Every day | ORAL | 1 refills | Status: DC

## 2016-07-11 NOTE — Telephone Encounter (Signed)
[facility] called to request the refill for pt of glipiZIDE (GLUCOTROL XL) 5 MG 24 hr tablet   From note on 3/19, pt was to start *New prescription of glipizide 5 mg ER tablet will be ordered for patient.  Pt is now out. Can you send new rx in to   [place], [place]

## 2016-07-11 NOTE — Progress Notes (Signed)
Noted  

## 2016-07-11 NOTE — Progress Notes (Signed)
Glipizide 50 mg ER sent to pharmacy for patient.

## 2016-07-12 NOTE — Telephone Encounter (Signed)
New Rx was sent to patient pharmacy yesterday 07/11/2016.

## 2016-07-26 ENCOUNTER — Other Ambulatory Visit: Payer: Self-pay | Admitting: Family Medicine

## 2016-07-26 ENCOUNTER — Other Ambulatory Visit: Payer: Self-pay

## 2016-07-26 DIAGNOSIS — E2839 Other primary ovarian failure: Secondary | ICD-10-CM

## 2016-07-26 DIAGNOSIS — M8580 Other specified disorders of bone density and structure, unspecified site: Secondary | ICD-10-CM

## 2016-07-26 DIAGNOSIS — R5381 Other malaise: Secondary | ICD-10-CM

## 2016-07-26 DIAGNOSIS — Z1231 Encounter for screening mammogram for malignant neoplasm of breast: Secondary | ICD-10-CM

## 2016-08-10 ENCOUNTER — Ambulatory Visit
Admission: RE | Admit: 2016-08-10 | Discharge: 2016-08-10 | Disposition: A | Discharge status: Home / Self Care | Payer: PPO | Source: Ambulatory Visit | Attending: Family Medicine | Admitting: Family Medicine

## 2016-08-10 ENCOUNTER — Ambulatory Visit (INDEPENDENT_AMBULATORY_CARE_PROVIDER_SITE_OTHER)
Admission: RE | Admit: 2016-08-10 | Discharge: 2016-08-10 | Disposition: A | Discharge status: Home / Self Care | Payer: PPO | Source: Ambulatory Visit | Attending: Family Medicine | Admitting: Family Medicine

## 2016-08-10 ENCOUNTER — Other Ambulatory Visit: Payer: PPO

## 2016-08-10 DIAGNOSIS — E2839 Other primary ovarian failure: Secondary | ICD-10-CM

## 2016-08-10 DIAGNOSIS — Z1231 Encounter for screening mammogram for malignant neoplasm of breast: Secondary | ICD-10-CM

## 2016-08-10 DIAGNOSIS — M8580 Other specified disorders of bone density and structure, unspecified site: Secondary | ICD-10-CM

## 2016-08-28 DIAGNOSIS — H25813 Combined forms of age-related cataract, bilateral: Secondary | ICD-10-CM | POA: Diagnosis not present

## 2016-09-12 ENCOUNTER — Other Ambulatory Visit: Payer: Self-pay

## 2016-09-12 ENCOUNTER — Encounter: Payer: Self-pay | Admitting: Family Medicine

## 2016-09-12 ENCOUNTER — Other Ambulatory Visit: Payer: Self-pay | Admitting: Family Medicine

## 2016-09-12 NOTE — Telephone Encounter (Signed)
Spoke with pt stated that she needed her diabetic supplies and also a new glucometer since she broke the one she has been using, Patient pharmacy was notified and patient is aware. Pt scheduled an appointment for A1C check on 09/17/16.

## 2016-09-17 ENCOUNTER — Other Ambulatory Visit: Payer: Self-pay | Admitting: Family Medicine

## 2016-09-17 ENCOUNTER — Other Ambulatory Visit (INDEPENDENT_AMBULATORY_CARE_PROVIDER_SITE_OTHER): Payer: PPO

## 2016-09-17 DIAGNOSIS — E1165 Type 2 diabetes mellitus with hyperglycemia: Secondary | ICD-10-CM | POA: Diagnosis not present

## 2016-09-17 DIAGNOSIS — E118 Type 2 diabetes mellitus with unspecified complications: Secondary | ICD-10-CM | POA: Diagnosis not present

## 2016-09-17 LAB — HEMOGLOBIN A1C: HEMOGLOBIN A1C: 6.8 % — AB (ref 4.6–6.5)

## 2016-09-21 ENCOUNTER — Other Ambulatory Visit: Payer: Self-pay | Admitting: Family Medicine

## 2016-10-02 ENCOUNTER — Other Ambulatory Visit: Payer: Self-pay | Admitting: Family Medicine

## 2016-11-12 DIAGNOSIS — L570 Actinic keratosis: Secondary | ICD-10-CM | POA: Diagnosis not present

## 2016-12-06 ENCOUNTER — Encounter: Payer: Self-pay | Admitting: Family Medicine

## 2016-12-14 ENCOUNTER — Other Ambulatory Visit: Payer: Self-pay | Admitting: Family Medicine

## 2016-12-19 ENCOUNTER — Telehealth: Payer: Self-pay | Admitting: *Deleted

## 2016-12-19 NOTE — Telephone Encounter (Signed)
Patient called and states that she wants to transfer care to [facility]. Patient states that both providers spoke and agreed on the transfer.  Dr. Yong Channel and Almyra Free please let me know if it is ok. Thank you

## 2016-12-19 NOTE — Telephone Encounter (Signed)
Yes thanks 

## 2016-12-20 NOTE — Telephone Encounter (Signed)
Yes, thank you.

## 2016-12-25 ENCOUNTER — Encounter: Payer: Self-pay | Admitting: Family Medicine

## 2016-12-27 ENCOUNTER — Ambulatory Visit (INDEPENDENT_AMBULATORY_CARE_PROVIDER_SITE_OTHER): Payer: PPO | Admitting: *Deleted

## 2016-12-27 DIAGNOSIS — Z23 Encounter for immunization: Secondary | ICD-10-CM | POA: Diagnosis not present

## 2017-01-11 ENCOUNTER — Encounter: Payer: Self-pay | Admitting: Family Medicine

## 2017-01-11 ENCOUNTER — Ambulatory Visit (INDEPENDENT_AMBULATORY_CARE_PROVIDER_SITE_OTHER): Payer: PPO | Admitting: Family Medicine

## 2017-01-11 VITALS — BP 152/86 | HR 113 | Temp 98.6°F | Ht 63.0 in | Wt 176.8 lb

## 2017-01-11 DIAGNOSIS — R109 Unspecified abdominal pain: Secondary | ICD-10-CM

## 2017-01-11 DIAGNOSIS — R82998 Other abnormal findings in urine: Secondary | ICD-10-CM

## 2017-01-11 DIAGNOSIS — I10 Essential (primary) hypertension: Secondary | ICD-10-CM

## 2017-01-11 LAB — POC URINALSYSI DIPSTICK (AUTOMATED)
Bilirubin, UA: NEGATIVE
Blood, UA: NEGATIVE
Glucose, UA: NEGATIVE
Ketones, UA: NEGATIVE
Nitrite, UA: NEGATIVE
PROTEIN UA: NEGATIVE
Spec Grav, UA: 1.015 (ref 1.010–1.025)
UROBILINOGEN UA: 0.2 U/dL
pH, UA: 6 (ref 5.0–8.0)

## 2017-01-11 MED ORDER — AMLODIPINE BESYLATE 10 MG PO TABS: 10.0000 mg | ORAL_TABLET | Freq: Every day | ORAL | 5 refills | Status: DC

## 2017-01-11 NOTE — Patient Instructions (Signed)
Increase amlodipine to 10mg . Bring home cuff to next visit. Love your averages but lets get them a little lower at home and definitely lower in office if we can.   I think this is a muscle strain of low back use. Use heat 15 minutes 3x a day for next 3 days.   Get urine culture just to make sure not related to infection- though doubt it

## 2017-01-11 NOTE — Telephone Encounter (Signed)
Pt returned your call. She is scheduled to see Dr Yong Channel today at 3:45pm.

## 2017-01-11 NOTE — Progress Notes (Signed)
Subjective:  Alexandra Fletcher is a 66 y.o. year old very pleasant female patient who presents for/with See problem oriented charting ROS- No chest pain or shortness of breath. No headache or blurry vision.  Also see ROS embededed below   Past Medical History-  Patient Active Problem List   Diagnosis Date Noted  . Dysphagia, pharyngoesophageal phase 02/21/2015  . Diabetic retinopathy ([place]) 12/19/2014  . Acute right arterial ischemic stroke, PCA (posterior cerebral artery) ([place]) 01/04/2013  . Eosinophilia 12/03/2011  . Sinus tarsi syndrome 01/31/2011  . Baker's cyst of knee 10/10/2010  . Hyperlipidemia 08/16/2009  . Essential hypertension 08/16/2009  . POSTMENOPAUSAL SYNDROME 08/16/2009  . History of colonic polyps 08/16/2009  . HEART MURMUR, BENIGN 05/16/2007  . History of abnormal Pap smear 04/21/2007  . Diabetes mellitus with diabetic retinopathy 01/23/2007    Medications- reviewed and updated Current Outpatient Prescriptions  Medication Sig Dispense Refill  . amLODipine (NORVASC) 5 MG tablet TAKE ONE (1) TABLET BY MOUTH EVERY DAY 90 tablet 1  . carvedilol (COREG) 6.25 MG tablet TAKE ONE (1) TABLET BY MOUTH TWO (2) TIMES DAILY WITH MEALS 180 tablet 1  . GLIPIZIDE XL 5 MG 24 hr tablet TAKE ONE (1) TABLET BY MOUTH EVERY DAY 90 tablet 1  . metFORMIN (GLUCOPHAGE) 1000 MG tablet TAKE ONE (1) TABLET BY MOUTH TWO (2) TIMES DAILY 180 tablet 1  . ONE TOUCH ULTRA TEST test strip USE TO TEST BLOOD GLUCOSE 2 TO 3 TIMES ADAY 100 each 3  . TRADJENTA 5 MG TABS tablet TAKE ONE (1) TABLET BY MOUTH EVERY DAY 90 tablet 1  . aspirin (BABY ASPIRIN) 81 MG chewable tablet Chew 81 mg by mouth daily.       Objective: BP (!) 152/86 (BP Location: Left Arm, Patient Position: Sitting, Cuff Size: Large)   Pulse (!) 113   Temp 98.6 F (37 C) (Oral)   Ht 5\' 3"  (1.6 m)   Wt 176 lb 12.8 oz (80.2 kg)   SpO2 96%   BMI 31.32 kg/m  Gen: NAD, resting comfortably CV: RRR no murmurs rubs or gallops Lungs: CTAB  no crackles, wheeze, rhonchi Abdomen: soft/nontender/nondistended/normal bowel sounds. Obese MSK: no pain with palpation of low back. Some tightness. Negative SLR  Ext: no edema  Results for orders placed or performed in visit on 01/11/17 (from the past 24 hour(s))  POCT Urinalysis Dipstick (Automated)     Status: Abnormal   Collection Time: 01/11/17  4:16 PM  Result Value Ref Range   Color, UA Yellow    Clarity, UA Clear    Glucose, UA Negative    Bilirubin, UA Negative    Ketones, UA Negative    Spec Grav, UA 1.015 1.010 - 1.025   Blood, UA Negative    pH, UA 6.0 5.0 - 8.0   Protein, UA Negative    Urobilinogen, UA 0.2 0.2 or 1.0 E.U./dL   Nitrite, UA Negative    Leukocytes, UA Small (1+) (A) Negative   Assessment/Plan:  left low back pain, Flank pain - Plan: POCT Urinalysis Dipstick (Automated), Urine Culture (leukocytes in urine S: Tuesday evening started with throbbing pain on left lower side of back. Even woke her up several times that night and the next night. Was improving as of last night- now feels better today. Pain 2/10 today. First night was 8/10. Aleve helps significantly x 2. No ice or heat yet.  ROS- No blood in urine. No vaginal discharge. No weakness in legs. No saddle anesthesia. No paresthesias.  No fever, chills, nausea vomiting  Working on PT with R knee (tennis injury). Did have PT on Tuesday but not sure if she overdid it. Did have to do some bridges A/P: Suspect MSK strain- discussed conservative care as per AVS. She was worried about UTI- initial urine with mild concern only- will get culture to fully rule out. I do not like that pain woke her up from sleep but is drastically improving so ok to monitor without imaging unless persists at follow up.   Essential hypertension S: controlled poorly on Amlodipine 5mg , coreg 6.25mg  BID in office. Home #s better with 1 month average 133/70 BP Readings from Last 3 Encounters:  01/11/17 (!) 152/86  02/23/16 (!) 158/88   07/28/15 (!) 142/88  A/P: We discussed blood pressure goal of <140/90, under 130 even better.  She would prefer to be lower so we opted to increase amlodipine to 10mg , continue coreg   Future Appointments Date Time Provider [place]  01/24/2017 10:00 AM Marin Olp, MD [facility] None  establish care visit. This visit was for acute care only of low back pain but noted elevated BP trend so this was treated.   Orders Placed This Encounter  Procedures  . Urine Culture    [facility]  . POCT Urinalysis Dipstick (Automated)    Return precautions advised.  Garret Reddish, MD

## 2017-01-12 LAB — URINE CULTURE
MICRO NUMBER: 81202717
SPECIMEN QUALITY: ADEQUATE

## 2017-01-12 NOTE — Assessment & Plan Note (Signed)
S: controlled poorly on Amlodipine 5mg , coreg 6.25mg  BID in office. Home #s better with 1 month average 133/70 BP Readings from Last 3 Encounters:  01/11/17 (!) 152/86  02/23/16 (!) 158/88  07/28/15 (!) 142/88  A/P: We discussed blood pressure goal of <140/90, under 130 even better.  She would prefer to be lower so we opted to increase amlodipine to 10mg , continue coreg

## 2017-01-17 ENCOUNTER — Encounter: Payer: Self-pay | Admitting: Gastroenterology

## 2017-01-24 ENCOUNTER — Ambulatory Visit (INDEPENDENT_AMBULATORY_CARE_PROVIDER_SITE_OTHER): Payer: PPO | Admitting: Family Medicine

## 2017-01-24 ENCOUNTER — Encounter: Payer: Self-pay | Admitting: Family Medicine

## 2017-01-24 VITALS — BP 138/84 | HR 94 | Temp 98.9°F | Ht 63.0 in | Wt 176.8 lb

## 2017-01-24 DIAGNOSIS — E785 Hyperlipidemia, unspecified: Secondary | ICD-10-CM | POA: Diagnosis not present

## 2017-01-24 DIAGNOSIS — Z87898 Personal history of other specified conditions: Secondary | ICD-10-CM

## 2017-01-24 DIAGNOSIS — E113293 Type 2 diabetes mellitus with mild nonproliferative diabetic retinopathy without macular edema, bilateral: Secondary | ICD-10-CM | POA: Diagnosis not present

## 2017-01-24 DIAGNOSIS — Z860100 Personal history of colon polyps, unspecified: Secondary | ICD-10-CM

## 2017-01-24 DIAGNOSIS — R1314 Dysphagia, pharyngoesophageal phase: Secondary | ICD-10-CM

## 2017-01-24 DIAGNOSIS — E113299 Type 2 diabetes mellitus with mild nonproliferative diabetic retinopathy without macular edema, unspecified eye: Secondary | ICD-10-CM | POA: Diagnosis not present

## 2017-01-24 DIAGNOSIS — I10 Essential (primary) hypertension: Secondary | ICD-10-CM | POA: Diagnosis not present

## 2017-01-24 DIAGNOSIS — Z23 Encounter for immunization: Secondary | ICD-10-CM

## 2017-01-24 DIAGNOSIS — Z8742 Personal history of other diseases of the female genital tract: Secondary | ICD-10-CM

## 2017-01-24 MED ORDER — SITAGLIPTIN PHOSPHATE 100 MG PO TABS: 100.0000 mg | ORAL_TABLET | Freq: Every day | ORAL | 3 refills | Status: DC

## 2017-01-24 NOTE — Progress Notes (Signed)
Phone: [phone]  Subjective:  Patient presents today to establish care with me as their new primary care provider. Patient was formerly a patient of Almira Coaster, NP. Chief complaint-noted.   See problem oriented charting ROS- No chest pain or shortness of breath. No headache or blurry vision.   The following were reviewed and entered/updated in epic: Past Medical History:  Diagnosis Date  . Adenomatous polyp 2003    Dr Olevia Perches  . Cerebral thrombosis with cerebral infarction [facility]) 2002   diagnosis needs confirmed by review of [facility]  . Diabetes mellitus   . Heart murmur    1x per Dr. Linna Darner- not recurrent  . Hyperlipidemia   . Hypertension   . Partial rupture of Achilles tendon    After fluorquinolone use   Patient Active Problem List   Diagnosis Date Noted  . Acute right arterial ischemic stroke, PCA (posterior cerebral artery) ([place]) 01/04/2013    Priority: High  . Diabetes mellitus with retinopathy ([place]) 01/23/2007    Priority: High  . Diabetic retinopathy ([place]) 12/19/2014    Priority: Medium  . Hyperlipidemia 08/16/2009    Priority: Medium  . Essential hypertension 08/16/2009    Priority: Medium  . Dysphagia, pharyngoesophageal phase 02/21/2015    Priority: Low  . Eosinophilia 12/03/2011    Priority: Low  . Sinus tarsi syndrome 01/31/2011    Priority: Low  . Baker's cyst of knee 10/10/2010    Priority: Low  . History of colonic polyps 08/16/2009    Priority: Low  . HEART MURMUR, BENIGN 05/16/2007    Priority: Low  . History of abnormal cervical Pap smear 04/21/2007    Priority: Low   Past Surgical History:  Procedure Laterality Date  . COLONOSCOPY  2008   neg  . colonoscopy with polypectomy  2003   Dr Olevia Perches; rectal polyps  . DILATION AND CURETTAGE OF UTERUS     X2  . KNEE ARTHROSCOPY W/ MENISCAL REPAIR  2008    Dr Adella Nissen knee  . Ovarian cysts removed      Dr Warnell Forester  . THUMB SURGERY  1983   Left; post skiing accident in  [place]    Family History  Problem Relation Age of Onset  . Liver disease Mother        ? malignancy after Hepatitis  . Diabetes Father   . Heart attack Father        CBAG @ 4  . Multiple myeloma Father   . Diabetes Brother        Type 1 DM  . Heart attack Brother 38  . Stroke Maternal Aunt        > 65  . Breast cancer Cousin   . Other Sister        died as child from pneumonia  . Healthy Brother   . Colon cancer Neg Hx     Medications- reviewed and updated Current Outpatient Medications  Medication Sig Dispense Refill  . amLODipine (NORVASC) 10 MG tablet Take 1 tablet (10 mg total) by mouth daily. 30 tablet 5  . aspirin (BABY ASPIRIN) 81 MG chewable tablet Chew 81 mg by mouth daily.      . carvedilol (COREG) 6.25 MG tablet TAKE ONE (1) TABLET BY MOUTH TWO (2) TIMES DAILY WITH MEALS 180 tablet 1  . GLIPIZIDE XL 5 MG 24 hr tablet TAKE ONE (1) TABLET BY MOUTH EVERY DAY 90 tablet 1  . metFORMIN (GLUCOPHAGE) 1000 MG tablet TAKE ONE (1) TABLET BY MOUTH TWO (  2) TIMES DAILY 180 tablet 1  . ONE TOUCH ULTRA TEST test strip USE TO TEST BLOOD GLUCOSE 2 TO 3 TIMES ADAY 100 each 3  . TRADJENTA 5 MG TABS tablet TAKE ONE (1) TABLET BY MOUTH EVERY DAY 90 tablet 1   Allergies-reviewed and updated Allergies  Allergen Reactions  . Lisinopril     Swelling of tongue  . Moxifloxacin     Achilles tendon rupture potentially  . Pioglitazone     REACTION: weight gain & ankle edema    Social History   Socioeconomic History  . Marital status: Married    Spouse name: None  . Number of children: 2  . Years of education: None  . Highest education level: None  Social Needs  . Financial resource strain: None  . Food insecurity - worry: None  . Food insecurity - inability: None  . Transportation needs - medical: None  . Transportation needs - non-medical: None  Occupational History  . Occupation: Agricultural engineer  Tobacco Use  . Smoking status: Never Smoker  . Smokeless tobacco: Never Used   Substance and Sexual Activity  . Alcohol use: Yes    Alcohol/week: 4.2 oz    Types: 7 Glasses of wine per week  . Drug use: No  . Sexual activity: Yes    Birth control/protection: Post-menopausal  Other Topics Concern  . None  Social History Narrative   Married 1976. 2 grown sons- 3 grandkids ( 2 close- granddaughters, and 1 grandson lives further away [place])      [employer])      Hobbies: enjoys travel, plays bridge   Objective: BP 138/84 (BP Location: Left Arm, Patient Position: Sitting, Cuff Size: Large)   Pulse 94   Temp 98.9 F (37.2 C) (Oral)   Ht _0  (1.6 m)   Wt 176 lb 12.8 oz (80.2 kg)   SpO2 96%   BMI 31.32 kg/m  Gen: NAD, resting comfortably CV: RRR no murmurs rubs or gallops Lungs: CTAB no crackles, wheeze, rhonchi Ext: no edema Skin: warm, dry  Assessment/Plan:  Essential hypertension S: improved control on amlodipine 34m, coreg 6.28mBID. Home #s had been 133/70 over month prior to change and now home #s similar around 135 at home- she can tell a difference and now never seeing numbers above 150 BP Readings from Last 3 Encounters:  01/24/17 138/84  01/11/17 (!) 152/86  02/23/16 (!) 158/88  A/P: We discussed blood pressure goal of <140/90. Continue current meds:  Now at goal  Diabetes mellitus with retinopathy ([place]: well controlled. 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  Lab Results  Component Value Date   HGBA1C 6.8 (H) 09/17/2016   HGBA1C 6.9 (H) 05/21/2016   HGBA1C 6.8 (H) 02/03/2016   A/P: update a1c. Mild retinopathy diagnosed years ago and has been stable  Diabetic retinopathy ([place]. [place]ordelia Pen [place] nonproliferative diabetic retinopathy.  Follows up each November. Stable since 2004 per patient  Hyperlipidemia Mild LDL elevation. Patient working on diet/exercise- with hopes to get LDL below 100 without medication   Future Appointments  Date Time Provider [place]/20/2018 11:00 AM Hun[place]in OlpD [facility] None  No Follow-up on file.  Orders Placed This Encounter  Procedures  . Pneumococcal polysaccharide vaccine 23-valent greater than or equal to 2yo subcutaneous/IM  . Ambulatory referral to Gastroenterology    Referral Priority:   Routine    Referral Type:   Consultation    Referral Reason:   Specialty Services Required    Number of Visits Requested:   1    Meds ordered this encounter  Medications  . sitaGLIPtin (JANUVIA) 100 MG tablet    Sig: Take 1 tablet (100 mg total) daily by mouth.    Dispense:  90 tablet    Refill:  3    Return precautions advised.  Garret Reddish, MD

## 2017-01-24 NOTE — Assessment & Plan Note (Addendum)
Follows closely with Dr. Cordelia Pen at [facility]. Bilateral nonproliferative diabetic retinopathy. Follows up each November. Stable since 2004 per patient

## 2017-01-24 NOTE — Assessment & Plan Note (Signed)
S: well controlled. 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  Lab Results  Component Value Date   HGBA1C 6.8 (H) 09/17/2016   HGBA1C 6.9 (H) 05/21/2016   HGBA1C 6.8 (H) 02/03/2016   A/P: update a1c. Mild retinopathy diagnosed years ago and has been stable

## 2017-01-24 NOTE — Assessment & Plan Note (Signed)
Mild LDL elevation. Patient working on diet/exercise- with hopes to get LDL below 100 without medication

## 2017-01-24 NOTE — Patient Instructions (Addendum)
Advised one final visit to [place] and have them send [place] copy since we have no record of abnormal pap and needed follow up  Schedule physical December 17th at 10 45. Come fasting and we will update all bloodwork  Pneumovax 23 today  Shingrix at your pharmacy- 2 shot series

## 2017-01-24 NOTE — Assessment & Plan Note (Signed)
S: improved control on amlodipine 10mg , coreg 6.25mg  BID. Home #s had been 133/70 over month prior to change and now home #s similar around 135 at home- she can tell a difference and now never seeing numbers above 150 BP Readings from Last 3 Encounters:  01/24/17 138/84  01/11/17 (!) 152/86  02/23/16 (!) 158/88  A/P: We discussed blood pressure goal of <140/90. Continue current meds:  Now at goal

## 2017-01-29 ENCOUNTER — Encounter: Payer: Self-pay | Admitting: Gastroenterology

## 2017-02-04 ENCOUNTER — Encounter: Payer: Self-pay | Admitting: Family Medicine

## 2017-02-13 ENCOUNTER — Other Ambulatory Visit: Payer: Self-pay

## 2017-02-13 ENCOUNTER — Ambulatory Visit (AMBULATORY_SURGERY_CENTER): Payer: Self-pay | Admitting: *Deleted

## 2017-02-13 VITALS — Ht 63.0 in | Wt 175.0 lb

## 2017-02-13 DIAGNOSIS — Z1211 Encounter for screening for malignant neoplasm of colon: Secondary | ICD-10-CM

## 2017-02-13 MED ORDER — NA SULFATE-K SULFATE-MG SULF 17.5-3.13-1.6 GM/177ML PO SOLN: 1.0000 | Freq: Once | ORAL | 0 refills | Status: AC

## 2017-02-13 NOTE — Progress Notes (Signed)
No egg or soy allergy known to patient  No issues with past sedation with any surgeries  or procedures, no intubation problems  No diet pills per patient No home 02 use per patient  No blood thinners per patient  Pt denies issues with constipation  No A fib or A flutter  EMMI video sent to pt's e mail pt declined   

## 2017-02-18 ENCOUNTER — Encounter: Payer: Self-pay | Admitting: Family Medicine

## 2017-02-23 ENCOUNTER — Encounter: Payer: Self-pay | Admitting: Gastroenterology

## 2017-02-25 ENCOUNTER — Encounter: Payer: Self-pay | Admitting: Gastroenterology

## 2017-02-27 ENCOUNTER — Encounter: Payer: Self-pay | Admitting: Family Medicine

## 2017-02-28 ENCOUNTER — Encounter: Payer: PPO | Admitting: Gastroenterology

## 2017-03-06 ENCOUNTER — Ambulatory Visit (INDEPENDENT_AMBULATORY_CARE_PROVIDER_SITE_OTHER): Payer: PPO | Admitting: Family Medicine

## 2017-03-06 ENCOUNTER — Encounter: Payer: Self-pay | Admitting: Family Medicine

## 2017-03-06 VITALS — BP 132/78 | HR 89 | Temp 98.8°F | Ht 63.0 in | Wt 172.0 lb

## 2017-03-06 DIAGNOSIS — E113299 Type 2 diabetes mellitus with mild nonproliferative diabetic retinopathy without macular edema, unspecified eye: Secondary | ICD-10-CM | POA: Diagnosis not present

## 2017-03-06 DIAGNOSIS — D721 Eosinophilia, unspecified: Secondary | ICD-10-CM

## 2017-03-06 DIAGNOSIS — E785 Hyperlipidemia, unspecified: Secondary | ICD-10-CM | POA: Diagnosis not present

## 2017-03-06 DIAGNOSIS — I10 Essential (primary) hypertension: Secondary | ICD-10-CM | POA: Diagnosis not present

## 2017-03-06 DIAGNOSIS — Z0000 Encounter for general adult medical examination without abnormal findings: Secondary | ICD-10-CM

## 2017-03-06 DIAGNOSIS — E113293 Type 2 diabetes mellitus with mild nonproliferative diabetic retinopathy without macular edema, bilateral: Secondary | ICD-10-CM | POA: Diagnosis not present

## 2017-03-06 NOTE — Progress Notes (Signed)
Phone: [phone]  Subjective:  Patient presents today for their annual physical. Chief complaint-noted.   See problem oriented charting- ROS- full  review of systems was completed and negative except for: nerves/anxiety after husband having massive heart attack and having stent  The following were reviewed and entered/updated in epic: Past Medical History:  Diagnosis Date  . Allergy   . Arthritis   . Cataract    mild x both eyes   . Cerebral thrombosis with cerebral infarction [facility]) 2002   diagnosis needs confirmed by review of [facility] records  . Diabetes mellitus   . Heart murmur    1x per Dr. Linna Darner- not recurrent  . Hyperlipidemia   . Hypertension   . Partial rupture of Achilles tendon    After fluorquinolone use  . Polyp of colon 2003    Dr Brodie/2003 polypoid colonoc mucosa- No adenomatous changes    Patient Active Problem List   Diagnosis Date Noted  . Acute right arterial ischemic stroke, PCA (posterior cerebral artery) ([place]) 01/04/2013    Priority: High  . Diabetes mellitus with retinopathy ([place]) 01/23/2007    Priority: High  . Diabetic retinopathy ([place]) 12/19/2014    Priority: Medium  . Hyperlipidemia 08/16/2009    Priority: Medium  . Essential hypertension 08/16/2009    Priority: Medium  . Dysphagia, pharyngoesophageal phase 02/21/2015    Priority: Low  . Eosinophilia 12/03/2011    Priority: Low  . Sinus tarsi syndrome 01/31/2011    Priority: Low  . Baker's cyst of knee 10/10/2010    Priority: Low  . History of colonic polyps 08/16/2009    Priority: Low  . HEART MURMUR, BENIGN 05/16/2007    Priority: Low  . History of abnormal cervical Pap smear 04/21/2007    Priority: Low   Past Surgical History:  Procedure Laterality Date  . COLONOSCOPY  2008   neg  . colonoscopy with polypectomy  2003   Dr Olevia Perches; rectal polyps  . DILATION AND CURETTAGE OF UTERUS     X2  . KNEE ARTHROSCOPY W/ MENISCAL REPAIR  2008    Dr Adella Nissen knee  . Ovarian  cysts removed      Dr Warnell Forester  . POLYPECTOMY  2003   polypoid colonic mucosa/ 2008 no polyps  . THUMB SURGERY  1983   Left; post skiing accident in [place]    Family History  Problem Relation Age of Onset  . Liver disease Mother        ? malignancy after Hepatitis  . Diabetes Father   . Heart attack Father        CBAG @ 24  . Multiple myeloma Father   . Hyperlipidemia Father   . Hypertension Father   . Diabetes Brother        Type 1 DM  . Heart attack Brother 55  . Stroke Maternal Aunt        > 65  . Breast cancer Cousin   . Other Sister        died as child from pneumonia  . Healthy Brother   . Colon cancer Neg Hx   . Colon polyps Neg Hx   . Esophageal cancer Neg Hx   . Rectal cancer Neg Hx   . Stomach cancer Neg Hx     Medications- reviewed and updated Current Outpatient Medications  Medication Sig Dispense Refill  . amLODipine (NORVASC) 10 MG tablet Take 1 tablet (10 mg total) by mouth daily. 30 tablet 5  . aspirin (BABY ASPIRIN)  81 MG chewable tablet Chew 81 mg by mouth daily.      . carvedilol (COREG) 6.25 MG tablet TAKE ONE (1) TABLET BY MOUTH TWO (2) TIMES DAILY WITH MEALS 180 tablet 1  . GLIPIZIDE XL 5 MG 24 hr tablet TAKE ONE (1) TABLET BY MOUTH EVERY DAY 90 tablet 1  . metFORMIN (GLUCOPHAGE) 1000 MG tablet TAKE ONE (1) TABLET BY MOUTH TWO (2) TIMES DAILY 180 tablet 1  . ONE TOUCH ULTRA TEST test strip USE TO TEST BLOOD GLUCOSE 2 TO 3 TIMES ADAY 100 each 3  . sitaGLIPtin (JANUVIA) 100 MG tablet Take 1 tablet (100 mg total) daily by mouth. 90 tablet 3   Allergies-reviewed and updated Allergies  Allergen Reactions  . Lisinopril     Swelling of tongue  . Moxifloxacin     Achilles tendon rupture potentially  . Pioglitazone     REACTION: weight gain & ankle edema    Social History   Socioeconomic History  . Marital status: Married    Spouse name: None  . Number of children: 2  . Years of education: None  . Highest education level: None  Social Needs    . Financial resource strain: None  . Food insecurity - worry: None  . Food insecurity - inability: None  . Transportation needs - medical: None  . Transportation needs - non-medical: None  Occupational History  . Occupation: Agricultural engineer  Tobacco Use  . Smoking status: Never Smoker  . Smokeless tobacco: Never Used  Substance and Sexual Activity  . Alcohol use: Yes    Alcohol/week: 4.2 oz    Types: 7 Glasses of wine per week  . Drug use: No  . Sexual activity: Yes    Birth control/protection: Post-menopausal  Other Topics Concern  . None  Social History Narrative   Married 1976. 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 Oropharynx normal Neck: no thyromegaly CV: RRR no murmurs rubs or gallops Lungs: CTAB no crackles, wheeze, rhonchi Abdomen: soft/nontender/nondistended/normal bowel sounds. No rebound or guarding. obese Ext: no edema Skin: warm, dry Neuro: grossly normal, moves all extremities, PERRLA  Diabetic Foot Exam - Simple   Simple Foot Form Diabetic Foot exam was performed with the following findings:  Yes 03/06/2017  2:55 PM  Visual Inspection No deformities, no ulcerations, no other skin breakdown bilaterally:  Yes Sensation Testing Intact to touch and monofilament testing bilaterally:  Yes Pulse Check Posterior Tibialis and Dorsalis pulse intact bilaterally:  Yes Comments    Assessment/Plan:  66 y.o. female presenting for annual physical.  Health Maintenance counseling: 1. Anticipatory guidance: Patient counseled  regarding regular dental exams -q6 months, eye exams - will be in January after rescheduling per practice, wearing seatbelts.  2. Risk factor reduction:  Advised patient of need for regular exercise and diet rich and fruits and vegetables to reduce risk of heart attack and stroke. Exercise- able to walk again with knee doing  better, back to swim school in January 8 hours a week. Diet-down 3 lbs through the holidays- wants to be 145-150 in the long run- slow and steady is reasonable.  Wt Readings from Last 3 Encounters:  03/06/17 172 lb (78 kg)  02/13/17 175 lb (79.4 kg)  01/24/17 176 lb 12.8 oz (80.2 kg)  3. Immunizations/screenings/ancillary studies- discussed shingrix availability issues - she has been able to get the first dose Immunization History  Administered Date(s) Administered  . Influenza Split 02/22/2011, 12/03/2011  . Influenza Whole 01/15/2007, 12/17/2007, 03/03/2008, 12/07/2009  . Influenza, High Dose Seasonal PF 01/13/2016, 12/27/2016  . Influenza,inj,Quad PF,6+ Mos 01/20/2013  . Influenza-Unspecified 12/30/2014  . Pneumococcal Conjugate-13 02/21/2015  . Pneumococcal Polysaccharide-23 01/24/2017  . Td 05/21/2002  . Tdap 02/11/2013  . Zoster 01/05/2011  . Zoster Recombinat (Shingrix) 02/12/2017  4. Cervical cancer screening- Advised one final visit to [place] and have them send [place] copy since we have no record of abnormal pap and needed follow up 5. Breast cancer screening-  breast exam- declined- and mammogram 08/10/16 6. Colon cancer screening - has done preop- currently working on rescheduling due to heart attack in husband. Last 2008. Had one 2003 prior and notes suggested adenoma but I looked at pathology and this was just colonic mucosa- unclear why she had 5 year repeat from first colonoscopy.  7. Skin cancer screening- Dr. Wilhemina Bonito- with being lifeguard several years. advised regular sunscreen use. Denies worrisome, changing, or new skin lesions.  8. Osteoporosis  screening at 76- normal at age 89- no repeat needed  Status of chronic or acute concerns   Other 1. Some anxiety- husband Dec 8th heart attack 2. Knee much better now  Diabetes mellitus with retinopathy ([place]) Diabetes- controlled suspected- on januvia 1536m (had to change due to cost), glipizide 5336mXL, metformin 1 g BID. Sees Dr. GrCordelia Pent waDucoror her retinopathy- bilateral nonproliferative diabetic retinopathy. Sees eachnovember but pushed back to January  Fasting blood sugar averages 126. 2 hours after dinner is 128.  Lab Results  Component Value Date   HGBA1C 6.8 (H) 09/17/2016  - get urine microalbumin - optho scheduled for january - patient would prefer for morning CBGs to be better- she seems to have done better on amaryl 36m38mn AM and 1/2 mg in pm. Will do this only if a1c above 7.   Hyperlipidemia HLD- work on diet/exercise with hopes to get LDL below 100 without meds, honestly with prior stroke could push for under 70. Strongly considering at least once a week cholesterol medicine- perhaps atorvastastin 88m77mssential hypertension HTN- on amlodipine 10mg27mcent increase), coreg 6.25mg 28mcontrolled. Home #s 125/72 with increase in amlodipine. No edema  No Follow-up on file.  Orders Placed This Encounter  Procedures  . Hemoglobin A1c    [place]    Standing Status:   Future    Standing Expiration Date:   03/06/2018  . CBC with Differential/Platelet    Standing Status:   Future    Standing Expiration Date:   03/06/2018  . Comprehensive metabolic panel    [place]    Standing Status:   Future    Standing Expiration Date:   03/06/2018  . Lipid panel    Standing Status:   Future    Standing Expiration Date:   03/06/2018  . Microalbumin / creatinine urine ratio    [place]    Standing Status:   Future    Standing Expiration Date:  03/06/2018   Return precautions advised.  Garret Reddish, MD

## 2017-03-06 NOTE — Assessment & Plan Note (Signed)
HLD- work on diet/exercise with hopes to get LDL below 100 without meds, honestly with prior stroke could push for under 70. Strongly considering at least once a week cholesterol medicine- perhaps atorvastastin 40mg 

## 2017-03-06 NOTE — Assessment & Plan Note (Signed)
Diabetes- controlled suspected- on januvia 100mg  (had to change due to cost), glipizide 5mg  XL, metformin 1 g BID. Sees Dr. Cordelia Pen at [place] for her retinopathy- bilateral nonproliferative diabetic retinopathy. Sees eachnovember but pushed back to January  Fasting blood sugar averages 126. 2 hours after dinner is 128.  Lab Results  Component Value Date   HGBA1C 6.8 (H) 09/17/2016  - get urine microalbumin - optho scheduled for january - patient would prefer for morning CBGs to be better- she seems to have done better on amaryl 1mg  in AM and 1/2 mg in pm. Will do this only if a1c above 7.

## 2017-03-06 NOTE — Assessment & Plan Note (Signed)
HTN- on amlodipine 10mg  (recent increase), coreg 6.25mg  BID controlled. Home #s 125/72 with increase in amlodipine. No edema

## 2017-03-06 NOTE — Patient Instructions (Signed)
LDL goal below 100 without meds, honestly with prior stroke could push for under 70. Strongly considering at least once a week cholesterol medicine- perhaps atorvastastin 40mg 

## 2017-03-07 ENCOUNTER — Encounter: Payer: PPO | Admitting: Family Medicine

## 2017-03-08 ENCOUNTER — Other Ambulatory Visit (INDEPENDENT_AMBULATORY_CARE_PROVIDER_SITE_OTHER): Payer: PPO

## 2017-03-08 DIAGNOSIS — D721 Eosinophilia, unspecified: Secondary | ICD-10-CM

## 2017-03-08 DIAGNOSIS — E785 Hyperlipidemia, unspecified: Secondary | ICD-10-CM

## 2017-03-08 DIAGNOSIS — I10 Essential (primary) hypertension: Secondary | ICD-10-CM

## 2017-03-08 DIAGNOSIS — E113299 Type 2 diabetes mellitus with mild nonproliferative diabetic retinopathy without macular edema, unspecified eye: Secondary | ICD-10-CM | POA: Diagnosis not present

## 2017-03-08 DIAGNOSIS — Z0000 Encounter for general adult medical examination without abnormal findings: Secondary | ICD-10-CM | POA: Diagnosis not present

## 2017-03-08 LAB — CBC WITH DIFFERENTIAL/PLATELET
BASOS ABS: 0.1 10*3/uL (ref 0.0–0.1)
Basophils Relative: 1.5 % (ref 0.0–3.0)
EOS ABS: 0.8 10*3/uL — AB (ref 0.0–0.7)
Eosinophils Relative: 12.2 % — ABNORMAL HIGH (ref 0.0–5.0)
HEMATOCRIT: 42.6 % (ref 36.0–46.0)
HEMOGLOBIN: 14.2 g/dL (ref 12.0–15.0)
LYMPHS PCT: 29.9 % (ref 12.0–46.0)
Lymphs Abs: 1.9 10*3/uL (ref 0.7–4.0)
MCHC: 33.4 g/dL (ref 30.0–36.0)
MCV: 91.6 fl (ref 78.0–100.0)
MONO ABS: 0.4 10*3/uL (ref 0.1–1.0)
Monocytes Relative: 6.5 % (ref 3.0–12.0)
Neutro Abs: 3.2 10*3/uL (ref 1.4–7.7)
Neutrophils Relative %: 49.9 % (ref 43.0–77.0)
Platelets: 229 10*3/uL (ref 150.0–400.0)
RBC: 4.65 Mil/uL (ref 3.87–5.11)
RDW: 13.6 % (ref 11.5–15.5)
WBC: 6.4 10*3/uL (ref 4.0–10.5)

## 2017-03-08 LAB — COMPREHENSIVE METABOLIC PANEL
ALBUMIN: 4.5 g/dL (ref 3.5–5.2)
ALK PHOS: 52 U/L (ref 39–117)
ALT: 24 U/L (ref 0–35)
AST: 15 U/L (ref 0–37)
BUN: 24 mg/dL — AB (ref 6–23)
CALCIUM: 9.4 mg/dL (ref 8.4–10.5)
CHLORIDE: 99 meq/L (ref 96–112)
CO2: 29 mEq/L (ref 19–32)
CREATININE: 0.95 mg/dL (ref 0.40–1.20)
GFR: 62.33 mL/min (ref >60.00)
Glucose, Bld: 157 mg/dL — ABNORMAL HIGH (ref 70–99)
Potassium: 4 mEq/L (ref 3.5–5.1)
SODIUM: 136 meq/L (ref 135–145)
TOTAL PROTEIN: 7 g/dL (ref 6.0–8.3)
Total Bilirubin: 0.7 mg/dL (ref 0.2–1.2)

## 2017-03-08 LAB — LIPID PANEL
CHOL/HDL RATIO: 3
Cholesterol: 178 mg/dL (ref 0–200)
HDL: 67.2 mg/dL (ref >39.00)
LDL CALC: 99 mg/dL (ref 0–99)
NONHDL: 110.85
TRIGLYCERIDES: 58 mg/dL (ref 0.0–149.0)
VLDL: 11.6 mg/dL (ref 0.0–40.0)

## 2017-03-08 LAB — MICROALBUMIN / CREATININE URINE RATIO
Creatinine,U: 204.3 mg/dL
MICROALB UR: 2.6 mg/dL — AB (ref 0.0–1.9)
Microalb Creat Ratio: 1.2 mg/g (ref 0.0–30.0)

## 2017-03-08 LAB — HEMOGLOBIN A1C: HEMOGLOBIN A1C: 6.6 % — AB (ref 4.6–6.5)

## 2017-03-09 ENCOUNTER — Encounter: Payer: Self-pay | Admitting: Family Medicine

## 2017-03-11 ENCOUNTER — Other Ambulatory Visit: Payer: Self-pay

## 2017-03-11 MED ORDER — ATORVASTATIN CALCIUM 10 MG PO TABS: 10.0000 mg | ORAL_TABLET | ORAL | 3 refills | Status: DC

## 2017-03-21 ENCOUNTER — Other Ambulatory Visit: Payer: Self-pay | Admitting: Family Medicine

## 2017-03-22 ENCOUNTER — Encounter: Payer: Self-pay | Admitting: Family Medicine

## 2017-03-22 ENCOUNTER — Other Ambulatory Visit: Payer: Self-pay

## 2017-03-22 MED ORDER — CARVEDILOL 6.25 MG PO TABS: ORAL_TABLET | ORAL | 1 refills | Status: DC

## 2017-03-22 MED ORDER — METFORMIN HCL 1000 MG PO TABS: ORAL_TABLET | ORAL | 1 refills | Status: DC

## 2017-04-18 DIAGNOSIS — H35412 Lattice degeneration of retina, left eye: Secondary | ICD-10-CM | POA: Diagnosis not present

## 2017-04-18 DIAGNOSIS — H2513 Age-related nuclear cataract, bilateral: Secondary | ICD-10-CM | POA: Diagnosis not present

## 2017-04-18 DIAGNOSIS — E113293 Type 2 diabetes mellitus with mild nonproliferative diabetic retinopathy without macular edema, bilateral: Secondary | ICD-10-CM | POA: Diagnosis not present

## 2017-04-18 DIAGNOSIS — H43393 Other vitreous opacities, bilateral: Secondary | ICD-10-CM | POA: Diagnosis not present

## 2017-04-18 LAB — HM DIABETES EYE EXAM

## 2017-05-08 ENCOUNTER — Encounter: Payer: Self-pay | Admitting: Family Medicine

## 2017-06-03 ENCOUNTER — Other Ambulatory Visit: Payer: Self-pay | Admitting: Family Medicine

## 2017-06-04 ENCOUNTER — Encounter: Payer: Self-pay | Admitting: Family Medicine

## 2017-06-04 ENCOUNTER — Other Ambulatory Visit: Payer: Self-pay | Admitting: *Deleted

## 2017-06-04 MED ORDER — GLUCOSE BLOOD VI STRP: ORAL_STRIP | 3 refills | Status: DC

## 2017-07-02 ENCOUNTER — Other Ambulatory Visit: Payer: Self-pay

## 2017-07-02 ENCOUNTER — Ambulatory Visit: Payer: PPO | Admitting: *Deleted

## 2017-07-02 VITALS — Ht 63.5 in | Wt 173.2 lb

## 2017-07-02 DIAGNOSIS — Z1211 Encounter for screening for malignant neoplasm of colon: Secondary | ICD-10-CM

## 2017-07-02 NOTE — Progress Notes (Signed)
Denies allergies to eggs or soy products. Denies complications with sedation or anesthesia. Denies O2 use. Denies use of diet or weight loss medications.  Emmi instructions given for colonoscopy.  Pt had suprep from previously scheduled colonoscopy in which appointment was unable to be kept due to husband health issue. Suprep instructions given.

## 2017-07-11 ENCOUNTER — Encounter: Payer: Self-pay | Admitting: Gastroenterology

## 2017-07-25 ENCOUNTER — Other Ambulatory Visit: Payer: Self-pay

## 2017-07-25 ENCOUNTER — Encounter: Payer: Self-pay | Admitting: Gastroenterology

## 2017-07-25 ENCOUNTER — Ambulatory Visit (AMBULATORY_SURGERY_CENTER): Payer: PPO | Admitting: Gastroenterology

## 2017-07-25 VITALS — BP 112/58 | HR 74 | Temp 98.4°F | Resp 11 | Ht 63.0 in | Wt 172.0 lb

## 2017-07-25 DIAGNOSIS — Z1211 Encounter for screening for malignant neoplasm of colon: Secondary | ICD-10-CM

## 2017-07-25 DIAGNOSIS — D123 Benign neoplasm of transverse colon: Secondary | ICD-10-CM | POA: Diagnosis not present

## 2017-07-25 DIAGNOSIS — I10 Essential (primary) hypertension: Secondary | ICD-10-CM | POA: Diagnosis not present

## 2017-07-25 DIAGNOSIS — E119 Type 2 diabetes mellitus without complications: Secondary | ICD-10-CM | POA: Diagnosis not present

## 2017-07-25 DIAGNOSIS — K635 Polyp of colon: Secondary | ICD-10-CM | POA: Diagnosis not present

## 2017-07-25 MED ORDER — SODIUM CHLORIDE 0.9 % IV SOLN: 500.0000 mL | Freq: Once | INTRAVENOUS | Status: DC

## 2017-07-25 NOTE — Progress Notes (Signed)
Pt's states no medical or surgical changes since previsit or office visit. 

## 2017-07-25 NOTE — Op Note (Addendum)
[place] Patient Name: Alexandra Fletcher Procedure Date: 07/25/2017 8:33 AM MRN: [id number] Endoscopist: Mallie Mussel L. Loletha Carrow , MD Age: 67 Referring MD:  Date of Birth: Oct 07, 1950 Gender: Female Account #: [id number] Procedure:                Colonoscopy Indications:              Screening for colorectal malignant neoplasm ( no                            polyps 12/2006) Medicines:                Monitored Anesthesia Care Procedure:                Pre-Anesthesia Assessment:                           - Prior to the procedure, a History and Physical                            was performed, and patient medications and                            allergies were reviewed. The patient's tolerance of                            previous anesthesia was also reviewed. The risks                            and benefits of the procedure and the sedation                            options and risks were discussed with the patient.                            All questions were answered, and informed consent                            was obtained. Anticoagulants: The patient has taken                            aspirin. It was decided not to withhold this                            medication prior to the procedure. ASA Grade                            Assessment: II - A patient with mild systemic                            disease. After reviewing the risks and benefits,                            the patient was deemed in satisfactory condition to  undergo the procedure.                           After obtaining informed consent, the colonoscope                            was passed under direct vision. Throughout the                            procedure, the patient's blood pressure, pulse, and                            oxygen saturations were monitored continuously. The                            Colonoscope was introduced through the anus and                             advanced to the the cecum, identified by                            appendiceal orifice and ileocecal valve. The                            colonoscopy was performed without difficulty. The                            patient tolerated the procedure well. The quality                            of the bowel preparation was excellent. The                            ileocecal valve, appendiceal orifice, and rectum                            were photographed. The quality of the bowel                            preparation was evaluated using the BBPS [facility]                            Bowel Preparation Scale) with scores of: Right                            Colon = 3, Transverse Colon = 3 and Left Colon = 3                            (entire mucosa seen well with no residual staining,                            small fragments of stool or opaque liquid). The  total BBPS score equals 9. Scope In: 8:39:41 AM Scope Out: 8:54:39 AM Scope Withdrawal Time: 0 hours 10 minutes 7 seconds  Total Procedure Duration: 0 hours 14 minutes 58 seconds  Findings:                 The perianal and digital rectal examinations were                            normal.                           A 5 mm polyp was found in the hepatic flexure. The                            polyp was sessile. The polyp was removed with a                            cold snare. Resection and retrieval were complete.                           The exam was otherwise without abnormality on                            direct and retroflexion views. Complications:            No immediate complications. Estimated Blood Loss:     Estimated blood loss was minimal. Impression:               - One 5 mm polyp at the hepatic flexure, removed                            with a cold snare. Resected and retrieved.                           - The examination was otherwise normal on direct                            and  retroflexion views. Recommendation:           - Patient has a contact number available for                            emergencies. The signs and symptoms of potential                            delayed complications were discussed with the                            patient. Return to normal activities tomorrow.                            Written discharge instructions were provided to the                            patient.                           -  Resume previous diet.                           - Continue present medications.                           - Await pathology results.                           - Repeat colonoscopy is recommended for                            surveillance. The colonoscopy date will be                            determined after pathology results from today's                            exam become available for review. Jalasia Eskridge L. Loletha Carrow, MD 07/25/2017 9:02:47 AM This report has been signed electronically.

## 2017-07-25 NOTE — Progress Notes (Signed)
Called to room to assist during endoscopic procedure.  Patient ID and intended procedure confirmed with present staff. Received instructions for my participation in the procedure from the performing physician.  

## 2017-07-25 NOTE — Patient Instructions (Signed)
Impression/Recommendations:  Polyp handout given to patient.  Resume previous diet. Continue present medications.  Repeat colonoscopy recommended for surveillance.  Date to be determined after pathology results reviewed.  YOU HAD AN ENDOSCOPIC PROCEDURE TODAY AT THE [facility]:   Refer to the procedure report that was given to you for any specific questions about what was found during the examination.  If the procedure report does not answer your questions, please call your gastroenterologist to clarify.  If you requested that your care partner not be given the details of your procedure findings, then the procedure report has been included in a sealed envelope for you to review at your convenience later.  YOU SHOULD EXPECT: Some feelings of bloating in the abdomen. Passage of more gas than usual.  Walking can help get rid of the air that was put into your GI tract during the procedure and reduce the bloating. If you had a lower endoscopy (such as a colonoscopy or flexible sigmoidoscopy) you may notice spotting of blood in your stool or on the toilet paper. If you underwent a bowel prep for your procedure, you may not have a normal bowel movement for a few days.  Please Note:  You might notice some irritation and congestion in your nose or some drainage.  This is from the oxygen used during your procedure.  There is no need for concern and it should clear up in a day or so.  SYMPTOMS TO REPORT IMMEDIATELY:   Following lower endoscopy (colonoscopy or flexible sigmoidoscopy):  Excessive amounts of blood in the stool  Significant tenderness or worsening of abdominal pains  Swelling of the abdomen that is new, acute  Fever of 100F or higher For urgent or emergent issues, a gastroenterologist can be reached at any hour by calling [phone].   DIET:  We do recommend a small meal at first, but then you may proceed to your regular diet.  Drink plenty of fluids but you should  avoid alcoholic beverages for 24 hours.  ACTIVITY:  You should plan to take it easy for the rest of today and you should NOT DRIVE or use heavy machinery until tomorrow (because of the sedation medicines used during the test).    FOLLOW UP: Our staff will call the number listed on your records the next business day following your procedure to check on you and address any questions or concerns that you may have regarding the information given to you following your procedure. If we do not reach you, we will leave a message.  However, if you are feeling well and you are not experiencing any problems, there is no need to return our call.  We will assume that you have returned to your regular daily activities without incident.  If any biopsies were taken you will be contacted by phone or by letter within the next 1-3 weeks.  Please call us at [phone] if you have not heard about the biopsies in 3 weeks.    SIGNATURES/CONFIDENTIALITY: You and/or your care partner have signed paperwork which will be entered into your electronic medical record.  These signatures attest to the fact that that the information above on your After Visit Summary has been reviewed and is understood.  Full responsibility of the confidentiality of this discharge information lies with you and/or your care-partner. 

## 2017-07-26 ENCOUNTER — Telehealth: Payer: Self-pay

## 2017-07-26 ENCOUNTER — Telehealth: Payer: Self-pay | Admitting: *Deleted

## 2017-07-26 NOTE — Telephone Encounter (Signed)
Left message on voicemail.

## 2017-07-26 NOTE — Telephone Encounter (Signed)
Left message

## 2017-07-30 ENCOUNTER — Encounter: Payer: Self-pay | Admitting: Gastroenterology

## 2017-08-23 ENCOUNTER — Encounter: Payer: Self-pay | Admitting: Family Medicine

## 2017-08-23 ENCOUNTER — Telehealth: Payer: Self-pay

## 2017-08-23 ENCOUNTER — Other Ambulatory Visit: Payer: PPO

## 2017-08-23 ENCOUNTER — Ambulatory Visit (INDEPENDENT_AMBULATORY_CARE_PROVIDER_SITE_OTHER): Payer: PPO | Admitting: Family Medicine

## 2017-08-23 VITALS — BP 138/80 | HR 94 | Temp 98.2°F | Ht 63.0 in | Wt 173.0 lb

## 2017-08-23 DIAGNOSIS — E113293 Type 2 diabetes mellitus with mild nonproliferative diabetic retinopathy without macular edema, bilateral: Secondary | ICD-10-CM

## 2017-08-23 DIAGNOSIS — E785 Hyperlipidemia, unspecified: Secondary | ICD-10-CM | POA: Diagnosis not present

## 2017-08-23 DIAGNOSIS — E113299 Type 2 diabetes mellitus with mild nonproliferative diabetic retinopathy without macular edema, unspecified eye: Secondary | ICD-10-CM

## 2017-08-23 DIAGNOSIS — I10 Essential (primary) hypertension: Secondary | ICD-10-CM | POA: Diagnosis not present

## 2017-08-23 LAB — POCT GLYCOSYLATED HEMOGLOBIN (HGB A1C): HEMOGLOBIN A1C: 6.5 % — AB (ref 4.0–5.6)

## 2017-08-23 NOTE — Assessment & Plan Note (Signed)
S:  controlled on Januvia 100 mg, glipizide 5 mg extended release, metformin 1 g twice daily.  Continues to follow-up with Dr. Luisa Hart at [facility] for retinopathy-bilateral nonproliferative diabetic retinopathy.  She saw him in January.  She started a new pack of strips lately- our # today is 132- she is going to go home and check to see if she gets somewhat similar.   CBGs- 7 day average 168, 14 day average 163, 30 day average 157. Usually 130s.  Exercise and diet-  no recent dietary changes. Still exercising- working in pool 3 hours a day for last 9 weeks 3-4 days a week.   Weight over last year actually down 7 lbs.  Lab Results  Component Value Date   HGBA1C 6.5 (A) 08/23/2017   HGBA1C 6.6 (H) 03/08/2017   HGBA1C 6.8 (H) 09/17/2016   A/P:  Seems well controlled- wonder if issues with her recent batch of strips. Our cbg today was 132 and when she got home was 163- I think we should leave meds the same and follow up in 3 months and 1 day.   From avs "Schedule week of September 9th with me- bring your #s for your blood sugars and your meter and we will recheck a1c more closely than usual since your #s are higher.   Would love to have you message me with your sugar when you get home. We got 132 here.   If your meter is within 110 to 152 then we will consider it pretty accurate and strips ok. In that case- let me call you in glipizide 5 mg XR twice a day. "

## 2017-08-23 NOTE — Telephone Encounter (Signed)
I have attempted to call the patient to reschedule for another time/later today with Dr. Yong Channel but, had to leave a voicemail.

## 2017-08-23 NOTE — Progress Notes (Signed)
Subjective:  Alexandra Fletcher is a 67 y.o. year old very pleasant female patient who presents for/with See problem oriented charting ROS- some cough and congestion, no hypogylycemia- she has noted sugars actually running higher lately.  No increased edema  Past Medical History-  Patient Active Problem List   Diagnosis Date Noted  . Acute right arterial ischemic stroke, PCA (posterior cerebral artery) ([place]) 01/04/2013    Priority: High  . Diabetes mellitus with retinopathy ([place]) 01/23/2007    Priority: High  . Diabetic retinopathy ([place]) 12/19/2014    Priority: Medium  . Hyperlipidemia 08/16/2009    Priority: Medium  . Essential hypertension 08/16/2009    Priority: Medium  . Dysphagia, pharyngoesophageal phase 02/21/2015    Priority: Low  . Eosinophilia 12/03/2011    Priority: Low  . Sinus tarsi syndrome 01/31/2011    Priority: Low  . Baker's cyst of knee 10/10/2010    Priority: Low  . History of colonic polyps 08/16/2009    Priority: Low  . HEART MURMUR, BENIGN 05/16/2007    Priority: Low  . History of abnormal cervical Pap smear 04/21/2007    Priority: Low    Medications- reviewed and updated Current Outpatient Medications  Medication Sig Dispense Refill  . amLODipine (NORVASC) 10 MG tablet Take 1 tablet (10 mg total) by mouth daily. 30 tablet 5  . aspirin (BABY ASPIRIN) 81 MG chewable tablet Chew 81 mg by mouth daily.      Marland Kitchen atorvastatin (LIPITOR) 10 MG tablet Take 1 tablet (10 mg total) by mouth once a week. 13 tablet 3  . carvedilol (COREG) 6.25 MG tablet TAKE ONE (1) TABLET BY MOUTH TWO (2) TIMES DAILY WITH MEALS 180 tablet 1  . GLIPIZIDE XL 5 MG 24 hr tablet TAKE ONE (1) TABLET BY MOUTH EVERY DAY 90 tablet 1  . glucose blood (ONE TOUCH ULTRA TEST) test strip USE TO TEST BLOOD GLUCOSE 2 TO 3 TIMES ADAY 100 each 3  . metFORMIN (GLUCOPHAGE) 1000 MG tablet TAKE ONE (1) TABLET BY MOUTH TWO (2) TIMES DAILY 180 tablet 1  . sitaGLIPtin (JANUVIA) 100 MG tablet Take 1  tablet (100 mg total) daily by mouth. 90 tablet 3   Current Facility-Administered Medications  Medication Dose Route Frequency Provider Last Rate Last Dose  . 0.9 %  sodium chloride infusion  500 mL Intravenous Once Nelida Meuse III, MD        Objective: BP 138/80 (BP Location: Right Arm, Patient Position: Sitting, Cuff Size: Normal)   Pulse 94   Temp 98.2 F (36.8 C) (Oral)   Ht 5\' 3"  (1.6 m)   Wt 173 lb (78.5 kg)   SpO2 96%   BMI 30.65 kg/m  Gen: NAD, resting comfortably CV: RRR no murmurs rubs or gallops Lungs: CTAB no crackles, wheeze, rhonchi Abdomen: soft/nontender/nondistended/normal bowel sounds. Ext: trace edema Skin: warm, dry Neuro: normal gait and speech  Assessment/Plan:  Diabetes mellitus with retinopathy (HCC) S:  controlled on Januvia 100 mg, glipizide 5 mg extended release, metformin 1 g twice daily.  Continues to follow-up with Dr. Luisa Hart at [facility] for retinopathy-bilateral nonproliferative diabetic retinopathy.  She saw him in January.  She started a new pack of strips lately- our # today is 132- she is going to go home and check to see if she gets somewhat similar.   CBGs- 7 day average 168, 14 day average 163, 30 day average 157. Usually 130s.  Exercise and diet-  no recent dietary changes. Still exercising- working  in pool 3 hours a day for last 9 weeks 3-4 days a week.   Weight over last year actually down 7 lbs.  Lab Results  Component Value Date   HGBA1C 6.5 (A) 08/23/2017   HGBA1C 6.6 (H) 03/08/2017   HGBA1C 6.8 (H) 09/17/2016   A/P:  Seems well controlled- wonder if issues with her recent batch of strips. Our cbg today was 132 and when she got home was 163- I think we should leave meds the same and follow up in 3 months and 1 day.   From avs "Schedule week of September 9th with me- bring your #s for your blood sugars and your meter and we will recheck a1c more closely than usual since your #s are higher.   Would love to have you message me  with your sugar when you get home. We got 132 here.   If your meter is within 110 to 152 then we will consider it pretty accurate and strips ok. In that case- let me call you in glipizide 5 mg XR twice a day. "  Diabetic retinopathy ([place]) Continues to follow up with optho for retinopathy- she did not report any recent worsening of vision  Hyperlipidemia S: Hopefully reasonably controlled on atorvastatin 10 mg once a week.  With prior stroke would prefer her LDL under 70 Lab Results  Component Value Date   CHOL 178 03/08/2017   HDL 67.20 03/08/2017   LDLCALC 99 03/08/2017   LDLDIRECT 120.5 08/08/2009   TRIG 58.0 03/08/2017   CHOLHDL 3 03/08/2017   A/P: at next visit we will get LDL, CMP, CBC likely  Essential hypertension S: controlled on amlodipine 10 mg, Coreg 6.25 mg twice daily  Home numbers-averaging 110s over 60s or 70s  BP Readings from Last 3 Encounters:  08/23/17 138/80  07/25/17 (!) 112/58  03/06/17 132/78  A/P: We discussed blood pressure goal of <140/90. Continue current meds   Future Appointments  Date Time Provider [place]  11/25/2017  8:15 AM Marin Olp, MD [facility]     Lab/Order associations: Type 2 diabetes mellitus with mild nonproliferative retinopathy without macular edema, without long-term current use of insulin, unspecified laterality ([place]) - Plan: POCT glycosylated hemoglobin (Hb A1C)  Return precautions advised.  Garret Reddish, MD

## 2017-08-23 NOTE — Telephone Encounter (Signed)
Patient called the [facility] Alexandra Fletcher and scheduled appointment with Dr. Yong Channel for today for a point of care A1C.   Lab appointment has been cancelled after speaking with [facility] about no lab orders being placed.

## 2017-08-23 NOTE — Assessment & Plan Note (Signed)
S: Hopefully reasonably controlled on atorvastatin 10 mg once a week.  With prior stroke would prefer her LDL under 70 Lab Results  Component Value Date   CHOL 178 03/08/2017   HDL 67.20 03/08/2017   LDLCALC 99 03/08/2017   LDLDIRECT 120.5 08/08/2009   TRIG 58.0 03/08/2017   CHOLHDL 3 03/08/2017   A/P: at next visit we will get LDL, CMP, CBC likely

## 2017-08-23 NOTE — Telephone Encounter (Signed)
Pt coming this morning, 08/23/17 for lab visit. No future order placed. Note section says, "A1c." Please place future order.

## 2017-08-23 NOTE — Telephone Encounter (Signed)
Made On: 08/22/2017 4:08 PM By: Celso Amy and Kara-can you please investigate this?  I assume Alexandra Fletcher is with the [facility].  How is she able to make lab appointments for labs that are now ordered?  It would seem more appropriate to me that Alexandra Fletcher be scheduled for a visit and we can determine labs needed at that time.  Can you please call patient this morning to help find a time that works for her visit?  We can do a point-of-care A1c on the day of visit  If unable to be reached before she arrives- lets order a hemoglobin A1c under diabetes

## 2017-08-23 NOTE — Assessment & Plan Note (Signed)
Continues to follow up with optho for retinopathy- she did not report any recent worsening of vision

## 2017-08-23 NOTE — Patient Instructions (Addendum)
Health Maintenance Due  Topic Date Due  . OPHTHALMOLOGY EXAM - Our team looked on Care everywhere and got results for your exam. 01/18/2017   Schedule week of September 9th with me- bring your #s for your blood sugars and your meter and we will recheck a1c more closely than usual since your #s are higher.   Would love to have you message me with your sugar when you get home. We got 132 here.   If your meter is within 110 to 152 then we will consider it pretty accurate and strips ok. In that case- let me call you in glipizide 5 mg XR twice a day.

## 2017-08-23 NOTE — Assessment & Plan Note (Signed)
S: controlled on amlodipine 10 mg, Coreg 6.25 mg twice daily  Home numbers-averaging 110s over 60s or 70s  BP Readings from Last 3 Encounters:  08/23/17 138/80  07/25/17 (!) 112/58  03/06/17 132/78  A/P: We discussed blood pressure goal of <140/90. Continue current meds

## 2017-08-27 ENCOUNTER — Other Ambulatory Visit: Payer: Self-pay

## 2017-08-27 ENCOUNTER — Other Ambulatory Visit: Payer: Self-pay | Admitting: Family Medicine

## 2017-08-27 ENCOUNTER — Telehealth: Payer: Self-pay | Admitting: Family Medicine

## 2017-08-27 MED ORDER — AMLODIPINE BESYLATE 10 MG PO TABS: ORAL_TABLET | ORAL | 3 refills | Status: DC

## 2017-08-27 NOTE — Telephone Encounter (Signed)
Copied from [place] [id number]. Topic: General - Other >> Aug 27, 2017  9:48 AM Lennox Solders wrote: Reason for MMN:OTRRNHA pharmacist phillip    is calling and pt would like amlodipine 10  mg #90 instead of #30 with refills

## 2017-08-27 NOTE — Telephone Encounter (Signed)
See note

## 2017-08-27 NOTE — Telephone Encounter (Signed)
Prescription for #90 has been sent in with 3 refills

## 2017-09-14 ENCOUNTER — Other Ambulatory Visit: Payer: Self-pay | Admitting: Family Medicine

## 2017-09-22 ENCOUNTER — Encounter: Payer: Self-pay | Admitting: Family Medicine

## 2017-10-02 ENCOUNTER — Other Ambulatory Visit: Payer: Self-pay | Admitting: Family Medicine

## 2017-11-12 DIAGNOSIS — L570 Actinic keratosis: Secondary | ICD-10-CM | POA: Diagnosis not present

## 2017-11-12 DIAGNOSIS — L821 Other seborrheic keratosis: Secondary | ICD-10-CM | POA: Diagnosis not present

## 2017-11-12 DIAGNOSIS — L600 Ingrowing nail: Secondary | ICD-10-CM | POA: Diagnosis not present

## 2017-11-25 ENCOUNTER — Ambulatory Visit: Payer: PPO | Admitting: Family Medicine

## 2017-12-04 ENCOUNTER — Encounter: Payer: Self-pay | Admitting: Family Medicine

## 2017-12-10 ENCOUNTER — Encounter: Payer: Self-pay | Admitting: Family Medicine

## 2017-12-10 ENCOUNTER — Other Ambulatory Visit: Payer: Self-pay | Admitting: Family Medicine

## 2017-12-10 ENCOUNTER — Ambulatory Visit (INDEPENDENT_AMBULATORY_CARE_PROVIDER_SITE_OTHER): Payer: PPO | Admitting: Family Medicine

## 2017-12-10 VITALS — BP 150/90 | HR 81 | Temp 98.0°F | Ht 63.0 in | Wt 175.6 lb

## 2017-12-10 DIAGNOSIS — E785 Hyperlipidemia, unspecified: Secondary | ICD-10-CM

## 2017-12-10 DIAGNOSIS — Z23 Encounter for immunization: Secondary | ICD-10-CM | POA: Diagnosis not present

## 2017-12-10 DIAGNOSIS — I10 Essential (primary) hypertension: Secondary | ICD-10-CM | POA: Diagnosis not present

## 2017-12-10 DIAGNOSIS — E113299 Type 2 diabetes mellitus with mild nonproliferative diabetic retinopathy without macular edema, unspecified eye: Secondary | ICD-10-CM

## 2017-12-10 DIAGNOSIS — Z1231 Encounter for screening mammogram for malignant neoplasm of breast: Secondary | ICD-10-CM

## 2017-12-10 LAB — COMPREHENSIVE METABOLIC PANEL
ALT: 19 U/L (ref 0–35)
AST: 16 U/L (ref 0–37)
Albumin: 4.7 g/dL (ref 3.5–5.2)
Alkaline Phosphatase: 64 U/L (ref 39–117)
BUN: 23 mg/dL (ref 6–23)
CALCIUM: 10.1 mg/dL (ref 8.4–10.5)
CHLORIDE: 99 meq/L (ref 96–112)
CO2: 28 mEq/L (ref 19–32)
Creatinine, Ser: 0.88 mg/dL (ref 0.40–1.20)
GFR: 67.93 mL/min (ref >60.00)
Glucose, Bld: 173 mg/dL — ABNORMAL HIGH (ref 70–99)
POTASSIUM: 4.3 meq/L (ref 3.5–5.1)
SODIUM: 136 meq/L (ref 135–145)
Total Bilirubin: 0.5 mg/dL (ref 0.2–1.2)
Total Protein: 7.9 g/dL (ref 6.0–8.3)

## 2017-12-10 LAB — CBC
HCT: 43.2 % (ref 36.0–46.0)
Hemoglobin: 14.7 g/dL (ref 12.0–15.0)
MCHC: 34.1 g/dL (ref 30.0–36.0)
MCV: 90.4 fl (ref 78.0–100.0)
Platelets: 273 10*3/uL (ref 150.0–400.0)
RBC: 4.78 Mil/uL (ref 3.87–5.11)
RDW: 13 % (ref 11.5–15.5)
WBC: 7.7 10*3/uL (ref 4.0–10.5)

## 2017-12-10 LAB — HEMOGLOBIN A1C: HEMOGLOBIN A1C: 6.8 % — AB (ref 4.6–6.5)

## 2017-12-10 LAB — LDL CHOLESTEROL, DIRECT: LDL DIRECT: 96 mg/dL

## 2017-12-10 MED ORDER — SITAGLIPTIN PHOSPHATE 100 MG PO TABS: 100.0000 mg | ORAL_TABLET | Freq: Every day | ORAL | 3 refills | Status: DC

## 2017-12-10 MED ORDER — GLIPIZIDE ER 5 MG PO TB24: ORAL_TABLET | ORAL | 3 refills | Status: DC

## 2017-12-10 MED ORDER — METFORMIN HCL 1000 MG PO TABS: ORAL_TABLET | ORAL | 3 refills | Status: DC

## 2017-12-10 MED ORDER — ATORVASTATIN CALCIUM 10 MG PO TABS: 10.0000 mg | ORAL_TABLET | ORAL | 3 refills | Status: DC

## 2017-12-10 MED ORDER — CARVEDILOL 6.25 MG PO TABS: ORAL_TABLET | ORAL | 3 refills | Status: DC

## 2017-12-10 NOTE — Assessment & Plan Note (Signed)
S: suspect controlled on januvia 100mg , glipizide 5mg  XR, metformin 1 g BID.  -sees Dr. Luisa Hart at [facility] for retinopathy CBGs- 108 fasting over 17 readings. 2 hours after dinner 127.  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 Weight stable.  Lab Results  Component Value Date   HGBA1C 6.5 (A) 08/23/2017   HGBA1C 6.6 (H) 03/08/2017   HGBA1C 6.8 (H) 09/17/2016   A/P: continue current rx as long as a1c 7 or less.

## 2017-12-10 NOTE — Patient Instructions (Addendum)
Health Maintenance Due  Topic Date Due  . INFLUENZA VACCINE -pt requests to get this today 10/17/2017   [facility] job on blood pressure and blood sugar- no changes today as long as labs look stable and a1c 7 or less.   Please stop by lab before you go

## 2017-12-10 NOTE — Assessment & Plan Note (Signed)
S: hopefully controlled on atorvastatin 10mg  once a week. Ideally under 70.   A/P:  update LDL

## 2017-12-10 NOTE — Assessment & Plan Note (Signed)
S: controlled on amlodipine 10mg , coreg 6.25mg  BID- home #s still much lower at 122/70 over 16 readings. Verified home cuff today.  BP Readings from Last 3 Encounters:  12/10/17 (!) 150/90. Home cuff similar but home readings outside today excellent  08/23/17 138/80  07/25/17 (!) 112/58  A/P: We discussed blood pressure goal of <140/90. Continue current meds

## 2017-12-10 NOTE — Progress Notes (Signed)
Subjective:  Alexandra Fletcher is a 67 y.o. year old very pleasant female patient who presents for/with See problem oriented charting ROS- No chest pain or shortness of breath. No headache or blurry vision.    Past Medical History-  Patient Active Problem List   Diagnosis Date Noted  . Acute right arterial ischemic stroke, PCA (posterior cerebral artery) ([place]) 01/04/2013    Priority: High  . Diabetes mellitus with retinopathy ([place]) 01/23/2007    Priority: High  . Diabetic retinopathy ([place]) 12/19/2014    Priority: Medium  . Hyperlipidemia 08/16/2009    Priority: Medium  . Essential hypertension 08/16/2009    Priority: Medium  . Dysphagia, pharyngoesophageal phase 02/21/2015    Priority: Low  . Eosinophilia 12/03/2011    Priority: Low  . Sinus tarsi syndrome 01/31/2011    Priority: Low  . Baker's cyst of knee 10/10/2010    Priority: Low  . History of colonic polyps 08/16/2009    Priority: Low  . HEART MURMUR, BENIGN 05/16/2007    Priority: Low  . History of abnormal cervical Pap smear 04/21/2007    Priority: Low    Medications- reviewed and updated Current Outpatient Medications  Medication Sig Dispense Refill  . amLODipine (NORVASC) 10 MG tablet TAKE ONE (1) TABLET BY MOUTH EVERY DAY 90 tablet 3  . aspirin (BABY ASPIRIN) 81 MG chewable tablet Chew 81 mg by mouth daily.      Marland Kitchen atorvastatin (LIPITOR) 10 MG tablet Take 1 tablet (10 mg total) by mouth once a week. 13 tablet 3  . carvedilol (COREG) 6.25 MG tablet 1 tablet po BID 180 tablet 3  . glipiZIDE (GLIPIZIDE XL) 5 MG 24 hr tablet TAKE ONE (1) TABLET BY MOUTH EVERY DAY 90 tablet 3  . glucose blood (ONE TOUCH ULTRA TEST) test strip USE TO TEST BLOOD GLUCOSE 2 TO 3 TIMES ADAY 100 each 3  . metFORMIN (GLUCOPHAGE) 1000 MG tablet 1 tablet po BID 180 tablet 3  . sitaGLIPtin (JANUVIA) 100 MG tablet Take 1 tablet (100 mg total) by mouth daily. 90 tablet 3   No current facility-administered medications for this visit.      Objective: BP (!) 150/90 (BP Location: Left Arm, Patient Position: Sitting, Cuff Size: Normal)   Pulse 81   Temp 98 F (36.7 C) (Oral)   Ht 5\' 3"  (1.6 m)   Wt 175 lb 9.6 oz (79.7 kg)   LMP  (LMP Unknown)   SpO2 99%   BMI 31.11 kg/m  Gen: NAD, resting comfortably CV: RRR no murmurs rubs or gallops Lungs: CTAB no crackles, wheeze, rhonchi Abdomen: soft/nontender/nondistended/normal bowel sounds. obese Ext: no edema Skin: warm, dry  Assessment/Plan:  Diabetes mellitus with retinopathy ([place]) S: suspect controlled on januvia 100mg , glipizide 5mg  XR, metformin 1 g BID.  -sees Dr. Luisa Hart at [facility] for retinopathy CBGs- 108 fasting over 17 readings. 2 hours after dinner 127.  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 Weight stable.  Lab Results  Component Value Date   HGBA1C 6.5 (A) 08/23/2017   HGBA1C 6.6 (H) 03/08/2017   HGBA1C 6.8 (H) 09/17/2016   A/P: continue current rx as long as a1c 7 or less.   Hyperlipidemia S: hopefully controlled on atorvastatin 10mg  once a week. Ideally under 70.   A/P:  update LDL  Essential hypertension S: controlled on amlodipine 10mg , coreg 6.25mg  BID- home #s still much lower at 122/70 over 16 readings. Verified home cuff today.  BP  Readings from Last 3 Encounters:  12/10/17 (!) 150/90. Home cuff similar but home readings outside today excellent  08/23/17 138/80  07/25/17 (!) 112/58  A/P: We discussed blood pressure goal of <140/90. Continue current meds  Future Appointments  Date Time Provider [place]  12/31/2017  7:10 AM [facility]   Return in about 6 months (around 06/10/2018) for physical.  Lab/Order associations: Type 2 diabetes mellitus with mild nonproliferative retinopathy without macular edema, without long-term current use of insulin, unspecified laterality ([place]) - Plan: CBC, Comprehensive metabolic panel, LDL cholesterol, direct, Hemoglobin  A1c  Essential hypertension  Hyperlipidemia, unspecified hyperlipidemia type  Need for prophylactic vaccination and inoculation against influenza - Plan: Flu vaccine HIGH DOSE PF (Fluzone High dose)  Meds ordered this encounter  Medications  . sitaGLIPtin (JANUVIA) 100 MG tablet    Sig: Take 1 tablet (100 mg total) by mouth daily.    Dispense:  90 tablet    Refill:  3  . glipiZIDE (GLIPIZIDE XL) 5 MG 24 hr tablet    Sig: TAKE ONE (1) TABLET BY MOUTH EVERY DAY    Dispense:  90 tablet    Refill:  3  . carvedilol (COREG) 6.25 MG tablet    Sig: 1 tablet po BID    Dispense:  180 tablet    Refill:  3  . metFORMIN (GLUCOPHAGE) 1000 MG tablet    Sig: 1 tablet po BID    Dispense:  180 tablet    Refill:  3  . atorvastatin (LIPITOR) 10 MG tablet    Sig: Take 1 tablet (10 mg total) by mouth once a week.    Dispense:  13 tablet    Refill:  3   Return precautions advised.  Garret Reddish, MD

## 2017-12-12 ENCOUNTER — Other Ambulatory Visit: Payer: Self-pay | Admitting: Family Medicine

## 2017-12-31 ENCOUNTER — Ambulatory Visit
Admission: RE | Admit: 2017-12-31 | Discharge: 2017-12-31 | Disposition: A | Discharge status: Home / Self Care | Payer: PPO | Source: Ambulatory Visit

## 2017-12-31 DIAGNOSIS — Z1231 Encounter for screening mammogram for malignant neoplasm of breast: Secondary | ICD-10-CM

## 2018-03-17 ENCOUNTER — Encounter: Payer: Self-pay | Admitting: Sports Medicine

## 2018-03-17 ENCOUNTER — Other Ambulatory Visit: Payer: Self-pay

## 2018-03-17 ENCOUNTER — Ambulatory Visit: Payer: PPO | Admitting: Sports Medicine

## 2018-03-17 ENCOUNTER — Ambulatory Visit: Payer: Self-pay | Admitting: Family Medicine

## 2018-03-17 ENCOUNTER — Ambulatory Visit (INDEPENDENT_AMBULATORY_CARE_PROVIDER_SITE_OTHER): Payer: PPO | Admitting: Sports Medicine

## 2018-03-17 VITALS — BP 140/86 | HR 91 | Ht 63.0 in | Wt 176.8 lb

## 2018-03-17 DIAGNOSIS — M76821 Posterior tibial tendinitis, right leg: Secondary | ICD-10-CM

## 2018-03-17 DIAGNOSIS — M79671 Pain in right foot: Secondary | ICD-10-CM

## 2018-03-17 DIAGNOSIS — M76829 Posterior tibial tendinitis, unspecified leg: Secondary | ICD-10-CM | POA: Diagnosis not present

## 2018-03-17 MED ORDER — DICLOFENAC SODIUM 1 % TD GEL: TRANSDERMAL | 2 refills | Status: DC

## 2018-03-17 NOTE — Patient Instructions (Addendum)
I recommend you obtained a compression sleeve to help with your joint problems. There are many options on the market however I recommend obtaining a ankle Body Helix compression sleeve.  You can find information (including how to appropriate measure yourself for sizing) can be found at www.Body http://www.lambert.com/.  Many of these products are health savings account (HSA) eligible.   You can use the compression sleeve at any time throughout the day but is most important to use while being active as well as for 2 hours post-activity.   It is appropriate to ice following activity with the compression sleeve in place.

## 2018-03-17 NOTE — Progress Notes (Signed)
Alexandra Fletcher. , [place] [facility] [place] - 67 y.o. female MRN [id number]  Date of birth: 1951-03-15  Visit Date: 03/17/2018   PCP: Marin Olp, MD   Referred by: Marin Olp, MD   SUBJECTIVE:  Chief Complaint  Patient presents with  . Initial Assessment  . R medial foot pain    HPI: Patient presents with several days of worsening right medial foot pain directly over the navicular.  She has pain swelling and small amount of skin change but no significant erythema.  She denies any significant nighttime disturbances.  Pain is worsened following walking several days after getting a new pair shoes for Christmas.  REVIEW OF SYSTEMS: Patient has some mild lower extremity swelling but this is unchanged over time.  Otherwise full 12 point review of systems performed is negative  HISTORY:  Prior history reviewed and updated per electronic medical record.  Social History   Occupational History  . Occupation: Agricultural engineer  Tobacco Use  . Smoking status: Never Smoker  . Smokeless tobacco: Never Used  Substance and Sexual Activity  . Alcohol use: Yes    Alcohol/week: 7.0 standard drinks    Types: 7 Glasses of wine per week  . Drug use: No  . Sexual activity: Yes    Birth control/protection: Post-menopausal   Social History   Social History Narrative   Married 1976. 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 Allergy   . Arthritis   . Cataract    mild x both eyes   . Cerebral thrombosis with cerebral infarction [facility]) 2002   diagnosis needs confirmed by review of [facility] records  . Diabetes mellitus   . Heart murmur    1x per Dr. Linna Darner- not recurrent  .  Hyperlipidemia   . Hypertension   . Partial rupture of Achilles tendon    After fluorquinolone use     Past Surgical History:  Procedure Laterality Date  . COLONOSCOPY  2008   neg  . DILATION AND CURETTAGE OF UTERUS     X2  . KNEE ARTHROSCOPY W/ MENISCAL REPAIR  2008    Dr Adella Nissen knee  . Ovarian cysts removed      Dr Warnell Forester  . POLYPECTOMY  2003   polypoid colonic mucosa/ 2008 no polyps  . THUMB SURGERY  1983   Left; post skiing accident in [place]    family history includes Breast cancer in her cousin; Diabetes in her brother and father; Healthy in her brother; Heart attack in her father; Heart attack (age of onset: 8) in her brother; Hyperlipidemia in her father; Hypertension in her father; Liver disease in her mother; Multiple myeloma in her father; Other in her sister; Stroke in her maternal aunt. There is no history of Colon cancer, Colon polyps, Esophageal cancer, Rectal cancer, or Stomach cancer.  DATA OBTAINED & REVIEWED:  Recent Labs    08/23/17 1622 12/10/17 0841  HGBA1C 6.5* 6.8*  CALCIUM  --  10.1  AST  --  16  ALT  --  19   No problems updated. No specialty comments available.  OBJECTIVE:  VS:  HT:'5\' 3"'  (160 cm)  WT:176 lb 12.8 oz (80.2 kg)  BMI:31.33    BP:140/86  HR:91bpm  TEMP: ( )  RESP:95 %   PHYSICAL EXAM: Adult female.  No acute distress.  She is active.  She has normal-appearing feet and ankles with a small trace amount of swelling.  DP and PT pulses 2+/4.  Ankle motion is normal with poor posterior tibialis recruitment with toe off.  She has a stable ankle drawer test.  Marked pain with palpation directly over the navicular and posterior tibialis tendon.  Some pain with forefoot abduction.  Posterior tibialis tendon strength is 4/5.     ASSESSMENT   1. Posterior tibialis tendon insufficiency   2. Posterior tibial tendinitis of right lower extremity     PLAN:  Pertinent additional documentation may be included in corresponding  procedure notes, imaging studies, problem based documentation and patient instructions.  Procedures:  None  Medications:  Meds ordered this encounter  Medications  . diclofenac sodium (VOLTAREN) 1 % GEL    Sig: Apply topically to affected area qid    Dispense:  100 g    Refill:  2    Discussion/Instructions: No problem-specific Assessment & Plan notes found for this encounter.  RICE (Rest, ICE, Compression, Elevation) principles reviewed with the patient. Discussed appropriate use of both heat and ice with the patient today.  Discussed red flag symptoms that warrant earlier emergent evaluation and patient voices understanding.  Body Helix compression sleeve recommended  Acutely symptomatic posterior tibialis tendinopathy/tendinitis.  We discussed multiple options but given the severity of her pain I would like to see if she will improve with topical diclofenac and have her follow-up in 2 weeks to advance therapeutic exercises and performed further diagnostic evaluation with [facility] ultrasound if indicated.  Can consider nitroglycerin at follow-up.   Return in about 2 weeks (around 03/31/2018).          Gerda Diss, [place] [facility] Physician

## 2018-03-17 NOTE — Telephone Encounter (Signed)
Yes thanks- im ok with Dr. Paulla Fore visit. I think we have openings tomorrow if she prefers to see me- but seeing Dr. Paulla Fore for this makes sense  Since Roselyn Reef went home today- also sending to [place], Lavina Hamman.

## 2018-03-17 NOTE — Telephone Encounter (Signed)
Patient wore new walking shoes on Friday and Saturday she began having moderate pain in the inner aspect of rt foot. Reports mild swelling in the area. Does not recall an injury. Care advice given including ice, elevation and ibuprofen. No availability with PCP today. Routing to PCP for possible appointment today with Dr. Paulla Fore. Reason for Disposition . [1] MODERATE pain (e.g., interferes with normal activities, limping) AND [2] present > 3 days  Answer Assessment - Initial Assessment Questions 1. ONSET: "When did the pain start?"      Saturday 2. LOCATION: "Where is the pain located?"      Inner aspect of right foot. 3. PAIN: "How bad is the pain?"    (Scale 1-10; or mild, moderate, severe)   -  MILD (1-3): doesn't interfere with normal activities    -  MODERATE (4-7): interferes with normal activities (e.g., work or school) or awakens from sleep, limping    -  SEVERE (8-10): excruciating pain, unable to do any normal activities, unable to walk     7-8 4. WORK OR EXERCISE: "Has there been any recent work or exercise that involved this part of the body?"      Walking in new shoes 5. CAUSE: "What do you think is causing the foot pain?"     New shoes and walking in them 6. OTHER SYMPTOMS: "Do you have any other symptoms?" (e.g., leg pain, rash, fever, numbness)     Rt foot 7. PREGNANCY: "Is there any chance you are pregnant?" "When was your last menstrual period?"     no  Protocols used: FOOT PAIN-A-AH

## 2018-03-17 NOTE — Telephone Encounter (Signed)
Referral has been placed. 

## 2018-03-17 NOTE — Telephone Encounter (Signed)
See note

## 2018-03-17 NOTE — Telephone Encounter (Signed)
Called patient l/m to call office  

## 2018-03-31 ENCOUNTER — Ambulatory Visit (INDEPENDENT_AMBULATORY_CARE_PROVIDER_SITE_OTHER): Payer: PPO | Admitting: Sports Medicine

## 2018-03-31 ENCOUNTER — Encounter: Payer: Self-pay | Admitting: Sports Medicine

## 2018-03-31 VITALS — BP 158/80 | HR 94 | Ht 63.0 in | Wt 176.6 lb

## 2018-03-31 DIAGNOSIS — M79671 Pain in right foot: Secondary | ICD-10-CM | POA: Diagnosis not present

## 2018-03-31 DIAGNOSIS — M76821 Posterior tibial tendinitis, right leg: Secondary | ICD-10-CM | POA: Diagnosis not present

## 2018-03-31 DIAGNOSIS — M76829 Posterior tibial tendinitis, unspecified leg: Secondary | ICD-10-CM

## 2018-03-31 NOTE — Patient Instructions (Signed)
Please perform the exercise program that we have prepared for you and gone over in detail on a daily basis.  In addition to the handout you were provided you can access your program through: [email or web address]   Your unique program code is:  [email or web address]

## 2018-03-31 NOTE — Progress Notes (Signed)
Alexandra Fletcher. [place], [place] [facility] [place] - 69 y.o. female MRN [id number]  Date of birth: May 13, 1950  Visit Date: April 01, 2018  PCP: Marin Olp, MD   Referred by: Marin Olp, MD  SUBJECTIVE:  Chief Complaint  Patient presents with  . Follow-up    R medial ankle / post tib insufficiency    HPI: Patient is here after an acute visit 2 weeks ago for significant worsening pain.  She is responded well to the Voltaren gel and ankle Body Helix Compression Sleeve compression sleeve.  She continues to have a minimal amount of pain but actually reports being 92% improved.  Prolonged walking or time her foot does seem to make it worse.  REVIEW OF SYSTEMS: Per HPI  HISTORY:  Prior history reviewed and updated per electronic medical record.  Social History   Occupational History  . Occupation: Agricultural engineer  Tobacco Use  . Smoking status: Never Smoker  . Smokeless tobacco: Never Used  Substance and Sexual Activity  . Alcohol use: Yes    Alcohol/week: 7.0 standard drinks    Types: 7 Glasses of wine per week  . Drug use: No  . Sexual activity: Yes    Birth control/protection: Post-menopausal   Social History   Social History Narrative   Married 1976. 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  OBJECTIVE:  VS:  HT:5\' 3"  (160 cm)   WT:176 lb 9.6 oz (80.1 kg)  BMI:31.29    BP:(!) 158/80  HR:94bpm  TEMP: ( )  RESP:95 %   PHYSICAL EXAM: Right foot is overall well  aligned.  No significant deformity.  She has good palpable pulses.  Posterior tibialis tendon is slightly tender to palpation over the medial malleolus but this is minimal.  She does have poor strength with inversion.   ASSESSMENT   1. Right foot pain   2. Posterior tibialis tendon insufficiency   3. Posterior tibial tendinitis of right lower extremity      PLAN:  Pertinent additional documentation may be included in corresponding procedure notes, imaging studies, problem based documentation and patient instructions. No problem-specific Assessment & Plan notes found for this encounter.    Procedures:  PROCEDURE NOTE: THERAPEUTIC EXERCISES [phone])   Discussed the foundation of treatment for this condition is physical therapy and/or daily (5-6 days/week) therapeutic exercises, focusing on core strengthening, coordination, neuromuscular control/reeducation. 15 minutes spent for Therapeutic exercises as below and as referenced in the AVS. This included exercises focusing on stretching, strengthening, with significant focus on eccentric aspects.  Proper technique shown and discussed handout in great detail with ATC. All questions were discussed and answered.   Long term goals include an improvement in range of motion, strength, endurance as well as avoiding reinjury. Frequency of visits is one time as determined during today's office visit. Frequency of exercises to be performed is as per handout.  EXERCISES REVIEWED: 4 way ANKLE exercises [place] walking    Further Discussion/Instructions:  Patient Instructions  She is overall doing quite well.  We will have her continue wearing the body helix compression sleeve and using Voltaren gel as needed.  She will follow-up if any recurrent symptoms.   Activity modifications and the importance of avoiding exacerbating activities (limiting pain to no more than a 4 / 10 during or following activity) recommended and discussed. Discussed red flag  symptoms that warrant earlier emergent evaluation and patient voices understanding.     Return if symptoms worsen or fail to improve.          Gerda Diss, [place] [facility] Physician

## 2018-04-01 ENCOUNTER — Encounter: Payer: Self-pay | Admitting: Sports Medicine

## 2018-04-01 DIAGNOSIS — M76821 Posterior tibial tendinitis, right leg: Secondary | ICD-10-CM | POA: Insufficient documentation

## 2018-04-26 ENCOUNTER — Encounter: Payer: Self-pay | Admitting: Sports Medicine

## 2018-05-01 DIAGNOSIS — H35412 Lattice degeneration of retina, left eye: Secondary | ICD-10-CM | POA: Diagnosis not present

## 2018-05-01 DIAGNOSIS — H25813 Combined forms of age-related cataract, bilateral: Secondary | ICD-10-CM | POA: Diagnosis not present

## 2018-05-01 DIAGNOSIS — E113293 Type 2 diabetes mellitus with mild nonproliferative diabetic retinopathy without macular edema, bilateral: Secondary | ICD-10-CM | POA: Diagnosis not present

## 2018-05-01 DIAGNOSIS — H43393 Other vitreous opacities, bilateral: Secondary | ICD-10-CM | POA: Diagnosis not present

## 2018-05-01 DIAGNOSIS — Z7984 Long term (current) use of oral hypoglycemic drugs: Secondary | ICD-10-CM | POA: Diagnosis not present

## 2018-05-01 LAB — HM DIABETES EYE EXAM

## 2018-05-08 ENCOUNTER — Encounter: Payer: Self-pay | Admitting: Family Medicine

## 2018-05-27 ENCOUNTER — Other Ambulatory Visit: Payer: Self-pay | Admitting: Family Medicine

## 2018-06-09 ENCOUNTER — Encounter: Payer: Self-pay | Admitting: Family Medicine

## 2018-06-10 ENCOUNTER — Encounter: Payer: PPO | Admitting: Family Medicine

## 2018-06-10 NOTE — Telephone Encounter (Signed)
Please call pt and schedule WebEx visit with Dr. Yong Channel.

## 2018-06-12 ENCOUNTER — Ambulatory Visit (INDEPENDENT_AMBULATORY_CARE_PROVIDER_SITE_OTHER): Payer: PPO | Admitting: Family Medicine

## 2018-06-12 ENCOUNTER — Encounter: Payer: Self-pay | Admitting: Family Medicine

## 2018-06-12 VITALS — BP 113/66 | HR 73 | Ht 63.0 in | Wt 167.0 lb

## 2018-06-12 DIAGNOSIS — E785 Hyperlipidemia, unspecified: Secondary | ICD-10-CM

## 2018-06-12 DIAGNOSIS — Z79899 Other long term (current) drug therapy: Secondary | ICD-10-CM

## 2018-06-12 DIAGNOSIS — I10 Essential (primary) hypertension: Secondary | ICD-10-CM

## 2018-06-12 DIAGNOSIS — E113293 Type 2 diabetes mellitus with mild nonproliferative diabetic retinopathy without macular edema, bilateral: Secondary | ICD-10-CM | POA: Diagnosis not present

## 2018-06-12 DIAGNOSIS — E113299 Type 2 diabetes mellitus with mild nonproliferative diabetic retinopathy without macular edema, unspecified eye: Secondary | ICD-10-CM | POA: Diagnosis not present

## 2018-06-12 NOTE — Progress Notes (Signed)
Phone [phone]   Subjective:  Virtual visit via Video note  Our team/I connected with Franne Forts Douse on 06/12/18 at  9:20 AM EDT by a video enabled telemedicine application (webex) and verified that I am speaking with the correct person using two identifiers.  Location patient: Home[facility] Location provider: [facility] [address], office Persons participating in the virtual visit: patient  Our team/I discussed the limitations of evaluation and management by telemedicine and the availability of in person appointments. In light of current covid-19 pandemic, patient also understands that we are trying to protect them by minimizing in office contact if at all possible.  The patient expressed consent for telemedicine visit and agreed to proceed.   ROS- No chest pain or shortness of breath. No headache or blurry vision.  No fever or chills   Past Medical History-  Patient Active Problem List   Diagnosis Date Noted  . Acute right arterial ischemic stroke, PCA (posterior cerebral artery) ([place]) 01/04/2013    Priority: High  . Diabetes mellitus with retinopathy ([place]) 01/23/2007    Priority: High  . Diabetic retinopathy ([place]) 12/19/2014    Priority: Medium  . Hyperlipidemia 08/16/2009    Priority: Medium  . Essential hypertension 08/16/2009    Priority: Medium  . Dysphagia, pharyngoesophageal phase 02/21/2015    Priority: Low  . Eosinophilia 12/03/2011    Priority: Low  . Sinus tarsi syndrome 01/31/2011    Priority: Low  . Baker's cyst of knee 10/10/2010    Priority: Low  . History of colonic polyps 08/16/2009    Priority: Low  . HEART MURMUR, BENIGN 05/16/2007    Priority: Low  . History of abnormal cervical Pap smear 04/21/2007    Priority: Low  . Posterior tibial tendinitis of right lower extremity 04/01/2018    Medications- reviewed and updated Current Outpatient Medications  Medication Sig Dispense Refill  . amLODipine (NORVASC) 10 MG tablet TAKE ONE (1) TABLET BY MOUTH  EVERY DAY 90 tablet 3  . atorvastatin (LIPITOR) 10 MG tablet Take 1 tablet (10 mg total) by mouth once a week. 13 tablet 3  . carvedilol (COREG) 6.25 MG tablet 1 tablet po BID 180 tablet 3  . glipiZIDE (GLIPIZIDE XL) 5 MG 24 hr tablet TAKE ONE (1) TABLET BY MOUTH EVERY DAY 90 tablet 3  . glucose blood (ONE TOUCH ULTRA TEST) test strip USE TO TEST BLOOD GLUCOSE 2 TO 3 TIMES A DAY 100 each 3  . metFORMIN (GLUCOPHAGE) 1000 MG tablet 1 tablet po BID 180 tablet 3  . sitaGLIPtin (JANUVIA) 100 MG tablet Take 1 tablet (100 mg total) by mouth daily. 90 tablet 3   No current facility-administered medications for this visit.      Objective:  BP 113/66   Pulse 73   Ht 5\' 3"  (1.6 m)   Wt 167 lb (75.8 kg)   LMP  (LMP Unknown)   BMI 29.58 kg/m  Gen: NAD, resting comfortably Lungs: nonlabored, normal respiratory rate  Skin: warm, dry, no obvious rash     Assessment and Plan   # Diabetes with retinopathy S: has been controlled on metformin 1 tablet BID, januvia 100mg , glipizide 5 mg XR CBGs- average fasting sugar has been 121. She would prefer lower.  Exercise and diet- not able to get in pool right now with covid 19. Trying to do more plant based diet.   Fasting glucose 121 7 day average of glucose= 140 14 day average= 144 30 day average= 148 Lab Results  Component Value  Date   HGBA1C 6.8 (H) 12/10/2017   HGBA1C 6.5 (A) 08/23/2017   HGBA1C 6.6 (H) 03/08/2017   A/P: likely stable to mild poor control- consider adding glipizide to BID if needed. Will plan on updating a1c- within 1-2 months if covid 19 in better position -Reviewed health maintenance and thankfully patient is keeping up with her eye exams and had one in February-still with retinopathy  #hypertension S: controlled on amlodipine 10mg , coreg 6.25 mg BID,  BP Readings from Last 3 Encounters:  06/12/18 113/66  03/31/18 (!) 158/80  03/17/18 140/86  A/P: Stable. Continue current medications.   #hyperlipidemia S:  controlled  on atorvastatin 10mg  once a week Lab Results  Component Value Date   CHOL 178 03/08/2017   HDL 67.20 03/08/2017   LDLCALC 99 03/08/2017   LDLDIRECT 96.0 12/10/2017   TRIG 58.0 03/08/2017   CHOLHDL 3 03/08/2017   A/P: Stable. Continue current medications.   Labs in 1 to 2 months. Perhaps can reschedule physical in 3-6 months  Lab/Order associations: Type 2 diabetes mellitus with mild nonproliferative retinopathy without macular edema, without long-term current use of insulin, unspecified laterality ([place]) - Plan: Hemoglobin A1c, CBC with Differential/Platelet, Comprehensive metabolic panel, Microalbumin / creatinine urine ratio, Lipid panel  High risk medication use - Plan: Vitamin B12  Hyperlipidemia, unspecified hyperlipidemia type - Plan: Lipid panel  Essential hypertension - Plan: CBC with Differential/Platelet, Comprehensive metabolic panel  Mild nonproliferative diabetic retinopathy of both eyes without macular edema associated with type 2 diabetes mellitus ([place])  Return precautions advised.  Garret Reddish, MD

## 2018-06-12 NOTE — Patient Instructions (Addendum)
Health Maintenance Due  Topic Date Due  . FOOT EXAM pt has not lost any sensation in feet and denies foot sore or abnormalities  03/06/2018  . URINE MICROALBUMIN-when she comes in for labs 03/08/2018  . HEMOGLOBIN A1C-when she comes in for labs 06/10/2018   Patient is going to come in for labs in 1 month if COVID-19 is in better position-if not may postpone even another 1 to 2 months  Blood sugars seem to be doing well at home so we think this is a reasonable approach.

## 2018-07-21 ENCOUNTER — Other Ambulatory Visit (INDEPENDENT_AMBULATORY_CARE_PROVIDER_SITE_OTHER): Payer: PPO

## 2018-07-21 DIAGNOSIS — I10 Essential (primary) hypertension: Secondary | ICD-10-CM | POA: Diagnosis not present

## 2018-07-21 DIAGNOSIS — Z79899 Other long term (current) drug therapy: Secondary | ICD-10-CM | POA: Diagnosis not present

## 2018-07-21 DIAGNOSIS — E113299 Type 2 diabetes mellitus with mild nonproliferative diabetic retinopathy without macular edema, unspecified eye: Secondary | ICD-10-CM

## 2018-07-21 DIAGNOSIS — E785 Hyperlipidemia, unspecified: Secondary | ICD-10-CM | POA: Diagnosis not present

## 2018-07-21 LAB — COMPREHENSIVE METABOLIC PANEL
ALT: 20 U/L (ref 0–35)
AST: 14 U/L (ref 0–37)
Albumin: 4.4 g/dL (ref 3.5–5.2)
Alkaline Phosphatase: 65 U/L (ref 39–117)
BUN: 20 mg/dL (ref 6–23)
CO2: 27 mEq/L (ref 19–32)
Calcium: 9 mg/dL (ref 8.4–10.5)
Chloride: 99 mEq/L (ref 96–112)
Creatinine, Ser: 0.77 mg/dL (ref 0.40–1.20)
GFR: 74.43 mL/min (ref >60.00)
Glucose, Bld: 189 mg/dL — ABNORMAL HIGH (ref 70–99)
Potassium: 3.9 mEq/L (ref 3.5–5.1)
Sodium: 135 mEq/L (ref 135–145)
Total Bilirubin: 0.5 mg/dL (ref 0.2–1.2)
Total Protein: 6.6 g/dL (ref 6.0–8.3)

## 2018-07-21 LAB — HEMOGLOBIN A1C: Hgb A1c MFr Bld: 7 % — ABNORMAL HIGH (ref 4.6–6.5)

## 2018-07-21 LAB — LIPID PANEL
Cholesterol: 162 mg/dL (ref 0–200)
HDL: 67.6 mg/dL (ref >39.00)
LDL Cholesterol: 84 mg/dL (ref 0–99)
NonHDL: 94.29
Total CHOL/HDL Ratio: 2
Triglycerides: 50 mg/dL (ref 0.0–149.0)
VLDL: 10 mg/dL (ref 0.0–40.0)

## 2018-07-21 LAB — CBC WITH DIFFERENTIAL/PLATELET
Basophils Absolute: 0.1 10*3/uL (ref 0.0–0.1)
Basophils Relative: 0.9 % (ref 0.0–3.0)
Eosinophils Absolute: 0.8 10*3/uL — ABNORMAL HIGH (ref 0.0–0.7)
Eosinophils Relative: 12.1 % — ABNORMAL HIGH (ref 0.0–5.0)
HCT: 40.4 % (ref 36.0–46.0)
Hemoglobin: 14 g/dL (ref 12.0–15.0)
Lymphocytes Relative: 21.4 % (ref 12.0–46.0)
Lymphs Abs: 1.4 10*3/uL (ref 0.7–4.0)
MCHC: 34.7 g/dL (ref 30.0–36.0)
MCV: 89.8 fl (ref 78.0–100.0)
Monocytes Absolute: 0.4 10*3/uL (ref 0.1–1.0)
Monocytes Relative: 6.5 % (ref 3.0–12.0)
Neutro Abs: 3.8 10*3/uL (ref 1.4–7.7)
Neutrophils Relative %: 59.1 % (ref 43.0–77.0)
Platelets: 234 10*3/uL (ref 150.0–400.0)
RBC: 4.5 Mil/uL (ref 3.87–5.11)
RDW: 13.5 % (ref 11.5–15.5)
WBC: 6.4 10*3/uL (ref 4.0–10.5)

## 2018-07-21 LAB — MICROALBUMIN / CREATININE URINE RATIO
Creatinine,U: 104.4 mg/dL
Microalb Creat Ratio: 1.4 mg/g (ref 0.0–30.0)
Microalb, Ur: 1.4 mg/dL (ref 0.0–1.9)

## 2018-07-21 LAB — VITAMIN B12: Vitamin B-12: 234 pg/mL (ref 211–911)

## 2018-07-22 ENCOUNTER — Encounter: Payer: Self-pay | Admitting: Family Medicine

## 2018-08-02 ENCOUNTER — Other Ambulatory Visit: Payer: Self-pay | Admitting: Family Medicine

## 2018-08-04 NOTE — Telephone Encounter (Signed)
Last OV 06/12/18

## 2018-08-26 ENCOUNTER — Encounter: Payer: Self-pay | Admitting: Family Medicine

## 2018-08-26 ENCOUNTER — Other Ambulatory Visit: Payer: Self-pay

## 2018-08-26 ENCOUNTER — Ambulatory Visit (INDEPENDENT_AMBULATORY_CARE_PROVIDER_SITE_OTHER): Payer: PPO | Admitting: Family Medicine

## 2018-08-26 VITALS — BP 124/72 | HR 73 | Temp 98.0°F | Ht 63.0 in | Wt 179.6 lb

## 2018-08-26 DIAGNOSIS — E113299 Type 2 diabetes mellitus with mild nonproliferative diabetic retinopathy without macular edema, unspecified eye: Secondary | ICD-10-CM

## 2018-08-26 DIAGNOSIS — I10 Essential (primary) hypertension: Secondary | ICD-10-CM

## 2018-08-26 DIAGNOSIS — E1169 Type 2 diabetes mellitus with other specified complication: Secondary | ICD-10-CM | POA: Diagnosis not present

## 2018-08-26 DIAGNOSIS — E1159 Type 2 diabetes mellitus with other circulatory complications: Secondary | ICD-10-CM | POA: Diagnosis not present

## 2018-08-26 DIAGNOSIS — H6502 Acute serous otitis media, left ear: Secondary | ICD-10-CM | POA: Diagnosis not present

## 2018-08-26 DIAGNOSIS — E785 Hyperlipidemia, unspecified: Secondary | ICD-10-CM | POA: Diagnosis not present

## 2018-08-26 MED ORDER — GLIPIZIDE ER 10 MG PO TB24: ORAL_TABLET | ORAL | 3 refills | Status: DC

## 2018-08-26 MED ORDER — AMOXICILLIN-POT CLAVULANATE 875-125 MG PO TABS: 1.0000 | ORAL_TABLET | Freq: Two times a day (BID) | ORAL | 0 refills | Status: AC

## 2018-08-26 NOTE — Assessment & Plan Note (Addendum)
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  Trying to eat a healthy diet Lab Results  Component Value Date   HGBA1C 7.0 (H) 07/21/2018   HGBA1C 6.8 (H) 12/10/2017   HGBA1C 6.5 (A) 08/23/2017  A/P: Controlled by last A1c but trending up on fasting blood sugars-patient would like to try glipizide at 10 mg dose in addition to maintaining Januvia 100 mg and metformin max dose.  I increased medication for her today.

## 2018-08-26 NOTE — Progress Notes (Addendum)
Phone [phone]   Subjective:  Alexandra Fletcher is a 68 y.o. year old very pleasant female patient who presents for/with See problem oriented charting Chief Complaint  Patient presents with  . Ear Fullness    Pt c/o L ear pain. pt states that when she bites down it hurts. 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   ROS-  No fever, chills, cough, shortness of breath, body aches, sore throat, or loss of taste or smell Past Medical History-  Patient Active Problem List   Diagnosis Date Noted  . Acute right arterial ischemic stroke, PCA (posterior cerebral artery) ([place]) 01/04/2013    Priority: High  . Diabetes mellitus with retinopathy ([place]) 01/23/2007    Priority: High  . Diabetic retinopathy ([place]) 12/19/2014    Priority: Medium  . Hyperlipidemia 08/16/2009    Priority: Medium  . Essential hypertension 08/16/2009    Priority: Medium  . Dysphagia, pharyngoesophageal phase 02/21/2015    Priority: Low  . Eosinophilia 12/03/2011    Priority: Low  . Sinus tarsi syndrome 01/31/2011    Priority: Low  . Baker's cyst of knee 10/10/2010    Priority: Low  . History of colonic polyps 08/16/2009    Priority: Low  . HEART MURMUR, BENIGN 05/16/2007    Priority: Low  . History of abnormal cervical Pap smear 04/21/2007    Priority: Low  . Posterior tibial tendinitis of right lower extremity 04/01/2018    Medications- reviewed and updated Current Outpatient Medications  Medication Sig Dispense Refill  . amLODipine (NORVASC) 10 MG tablet TAKE ONE (1) TABLET BY MOUTH EVERY DAY 90 tablet 3  . atorvastatin (LIPITOR) 10 MG tablet Take 1 tablet (10 mg total) by mouth once a week. 13 tablet 3  . carvedilol (COREG) 6.25 MG tablet 1 tablet po BID 180 tablet 3  . glipiZIDE (GLUCOTROL XL) 10 MG 24 hr tablet TAKE ONE (1) TABLET BY MOUTH EVERY DAY 90 tablet 3  . glucose blood (ONE TOUCH ULTRA TEST) test strip USE TO TEST BLOOD GLUCOSE 2 TO 3  TIMES A DAY 100 each 3  . metFORMIN (GLUCOPHAGE) 1000 MG tablet 1 tablet po BID 180 tablet 3  . sitaGLIPtin (JANUVIA) 100 MG tablet Take 1 tablet (100 mg total) by mouth daily. 90 tablet 3  . vitamin B-12 (CYANOCOBALAMIN) 100 MCG tablet     . amoxicillin-clavulanate (AUGMENTIN) 875-125 MG tablet Take 1 tablet by mouth 2 (two) times daily for 7 days. 14 tablet 0   No current facility-administered medications for this visit.      Objective:  BP (!) 145/82   Pulse 73   Temp 98 F (36.7 C) (Oral)   Ht 5\' 3"  (1.6 m)   Wt 179 lb 9.6 oz (81.5 kg)   LMP  (LMP Unknown)   SpO2 98%   BMI 31.81 kg/m  Gen: NAD, resting comfortably Some clicking at left TMJ RIght TM obstructed by cerumen, left TM partially obstructed but visualized portion is erythematous and appears to be bulging CV: RRR no murmurs rubs or gallops Lungs: CTAB no crackles, wheeze, rhonchi Ext: no edema Skin: warm, dry  Diabetic Foot Exam - Simple   Simple Foot Form Diabetic Foot exam was performed with the following findings:  Yes 08/26/2018 11:31 AM  Visual Inspection No deformities, no ulcerations, no other skin breakdown bilaterally:  Yes Sensation Testing Intact to touch and monofilament testing bilaterally:  Yes Pulse Check Posterior Tibialis and Dorsalis pulse  intact bilaterally:  Yes Comments       Assessment and Plan   #hypertension S: controlled on amlodipine 10mg  and coreg 6.25mg  weight BID call cannot be comp . 121/66 last night.  BP Readings from Last 3 Encounters:  08/26/18 (!) 145/82  06/12/18 113/66  03/31/18 (!) 158/80  A/P: Poor control in office-due to whitecoat hypertension most likely.  Excellent home control still.  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  Trying to eat a healthy diet Lab Results  Component Value Date   HGBA1C 7.0 (H) 07/21/2018   HGBA1C 6.8 (H) 12/10/2017   HGBA1C 6.5 (A) 08/23/2017  A/P: Controlled by last A1c but trending up on fasting blood sugars-patient would like to try glipizide at 10 mg dose in addition to maintaining Januvia 100 mg and metformin max dose.  I increased medication for her today.  #hyperlipidemia S: Well controlled on atorvastatin 10 mg once a week on last check  A/P:  Stable. Continue current medications.    #Left ear pain.  Difficulty closing jaw completely S: For last 10 days at least-left ear pain- worse with laying on that side- has not been able to. Has had ear infections in the past but this feels different- more sharp shooting pains than constant throb.   She also feels like she cannot clench her teeth/close them all the way.  For pain she has taken 3 aleve in last week and a half.  A/P: I think patient has 2 issues-on exam appears to have acute otitis media on the left ear-we will treat with Augmentin.  I also been concerned she may interestingly have TMJ D- I feel some clicking/popping when she opens and closes her jaw- after I held pressure in this area she did feel like she can close her jaw more completely.  I recommended if she continues to have discomfort after the Augmentin to try 5 to 7 days of Aleve twice a day-if this does not improve her issue would recommend ENT referral.  She is in agreement to this plan -Patient did require irrigation of left ear for [place] to be able to partially view tympanic membrane-I recommended mineral oil to help remove remaining cerumen-regimen on AVS.  Future Appointments  Date Time Provider [place]  07/07/2019  9:20 AM Marin Olp, MD [facility]   Lab/Order associations: Type 2 diabetes mellitus with mild nonproliferative retinopathy  without macular edema, without long-term current use of insulin, unspecified laterality ([place])  Hypertension associated with diabetes ([place])  Hyperlipidemia associated with type 2 diabetes mellitus ([place])  Non-recurrent acute serous otitis media of left ear  Meds ordered this encounter  Medications  . glipiZIDE (GLUCOTROL XL) 10 MG 24 hr tablet    Sig: TAKE ONE (1) TABLET BY MOUTH EVERY DAY    Dispense:  90 tablet    Refill:  3  . amoxicillin-clavulanate (AUGMENTIN) 875-125 MG tablet    Sig: Take 1 tablet by mouth 2 (two) times daily for 7 days.    Dispense:  14 tablet    Refill:  0   Return precautions advised.  Garret Reddish,  MD  Addendum 12/03/18- patient reports home average of last 11 blood pressures as below BP Readings from Last 3 Encounters:  12/03/18 124/72  06/12/18 113/66  03/31/18 (!) 158/80   Garret Reddish

## 2018-08-26 NOTE — Patient Instructions (Addendum)
Mineral oil for ear full of wax Purchase mineral oil from laxative aisle Lay down on your side with ear that is bothering you facing up Use 3-4 drops with a dropper and place in ear for 30 seconds Place cotton swab outside of ear Turn to other side and allow this to drain Repeat 3-4 x a day Return to see [place] if not improving within a few days   Augmentin for 7 days for left ear infection  Can use aleve as needed for pain. If get to end of 7 days and not better- try aleve twice a day for 5-7 days. May be worth having pepcid on hand if you get reflux with this.   If no better after that, can get ENT opinion

## 2018-09-08 ENCOUNTER — Other Ambulatory Visit: Payer: Self-pay | Admitting: Family Medicine

## 2018-09-25 ENCOUNTER — Other Ambulatory Visit: Payer: Self-pay | Admitting: Family Medicine

## 2018-10-17 ENCOUNTER — Other Ambulatory Visit: Payer: Self-pay

## 2018-11-06 ENCOUNTER — Encounter: Payer: Self-pay | Admitting: Family Medicine

## 2018-11-06 DIAGNOSIS — Z23 Encounter for immunization: Secondary | ICD-10-CM | POA: Diagnosis not present

## 2018-11-17 DIAGNOSIS — L821 Other seborrheic keratosis: Secondary | ICD-10-CM | POA: Diagnosis not present

## 2018-11-17 DIAGNOSIS — L570 Actinic keratosis: Secondary | ICD-10-CM | POA: Diagnosis not present

## 2018-11-18 ENCOUNTER — Other Ambulatory Visit: Payer: Self-pay | Admitting: Family Medicine

## 2018-11-19 ENCOUNTER — Other Ambulatory Visit: Payer: Self-pay | Admitting: Family Medicine

## 2018-12-01 ENCOUNTER — Telehealth: Payer: Self-pay

## 2018-12-01 NOTE — Telephone Encounter (Signed)
Pts husband came in office in reference to needing new PCP. I informed pt Alexandra Fletcher was not accepting any new patients but since his wife was a pt of Alexandra Fletcher that I could send a message. Please advise.

## 2018-12-01 NOTE — Telephone Encounter (Signed)
Please call pt or husband to advise and assist in scheduling.

## 2018-12-01 NOTE — Telephone Encounter (Signed)
Forwarding to Dr. Yong Channel to advised if OK to accept new pt

## 2018-12-01 NOTE — Telephone Encounter (Signed)
Yes thanks okay to accept her husband

## 2018-12-02 ENCOUNTER — Encounter: Payer: Self-pay | Admitting: Family Medicine

## 2018-12-02 NOTE — Telephone Encounter (Signed)
Called pt and service was bad. Pt stated he would call back. Oct 30th spot is on hold for pt

## 2018-12-03 ENCOUNTER — Encounter: Payer: Self-pay | Admitting: Family Medicine

## 2018-12-23 ENCOUNTER — Other Ambulatory Visit: Payer: Self-pay | Admitting: Family Medicine

## 2019-02-02 ENCOUNTER — Telehealth: Payer: Self-pay | Admitting: Family Medicine

## 2019-02-02 ENCOUNTER — Other Ambulatory Visit: Payer: Self-pay | Admitting: Family Medicine

## 2019-02-02 NOTE — Telephone Encounter (Signed)
Patient is calling for a order for her A1C. Patient would like to be contacted for an appt after this is placed. Thank you.

## 2019-02-02 NOTE — Telephone Encounter (Signed)
Please call patient to make follow up with Dr. Yong Channel. We will do labs at that visit

## 2019-02-02 NOTE — Telephone Encounter (Signed)
See note

## 2019-02-03 ENCOUNTER — Other Ambulatory Visit: Payer: Self-pay

## 2019-02-03 ENCOUNTER — Encounter: Payer: Self-pay | Admitting: Family Medicine

## 2019-02-03 ENCOUNTER — Ambulatory Visit (INDEPENDENT_AMBULATORY_CARE_PROVIDER_SITE_OTHER): Payer: PPO | Admitting: Family Medicine

## 2019-02-03 VITALS — BP 135/82 | HR 90 | Temp 98.5°F | Ht 63.0 in | Wt 178.6 lb

## 2019-02-03 DIAGNOSIS — I10 Essential (primary) hypertension: Secondary | ICD-10-CM

## 2019-02-03 DIAGNOSIS — E785 Hyperlipidemia, unspecified: Secondary | ICD-10-CM

## 2019-02-03 DIAGNOSIS — E113299 Type 2 diabetes mellitus with mild nonproliferative diabetic retinopathy without macular edema, unspecified eye: Secondary | ICD-10-CM

## 2019-02-03 LAB — POCT GLYCOSYLATED HEMOGLOBIN (HGB A1C): Hemoglobin A1C: 6.6 % — AB (ref 4.0–5.6)

## 2019-02-03 MED ORDER — ATORVASTATIN CALCIUM 10 MG PO TABS: 10.0000 mg | ORAL_TABLET | ORAL | 3 refills | Status: DC

## 2019-02-03 NOTE — Patient Instructions (Addendum)
Read up on ozempic or trulicity- possible options to change to in place of januvia if a1c goes up.   No changes today  Recommended follow up: already scheduled for physical- look forward to seeing you in april

## 2019-02-03 NOTE — Progress Notes (Signed)
Phone [phone] In person visit   Subjective:   Alexandra Fletcher is a 68 y.o. year old very pleasant female patient who presents for/with See problem oriented charting Chief Complaint  Patient presents with  . Follow-up  . Diabetes    ROS- No chest pain or shortness of breath. No headache or blurry vision.    Past Medical History-  Patient Active Problem List   Diagnosis Date Noted  . Acute right arterial ischemic stroke, PCA (posterior cerebral artery) ([place]) 01/04/2013    Priority: High  . Diabetes mellitus with retinopathy ([place]) 01/23/2007    Priority: High  . Diabetic retinopathy ([place]) 12/19/2014    Priority: Medium  . Hyperlipidemia 08/16/2009    Priority: Medium  . Essential hypertension 08/16/2009    Priority: Medium  . Dysphagia, pharyngoesophageal phase 02/21/2015    Priority: Low  . Eosinophilia 12/03/2011    Priority: Low  . Sinus tarsi syndrome 01/31/2011    Priority: Low  . Baker's cyst of knee 10/10/2010    Priority: Low  . History of colonic polyps 08/16/2009    Priority: Low  . HEART MURMUR, BENIGN 05/16/2007    Priority: Low  . History of abnormal cervical Pap smear 04/21/2007    Priority: Low  . Posterior tibial tendinitis of right lower extremity 04/01/2018    Medications- reviewed and updated Current Outpatient Medications  Medication Sig Dispense Refill  . amLODipine (NORVASC) 10 MG tablet TAKE ONE (1) TABLET BY MOUTH EVERY DAY 90 tablet 3  . aspirin EC 81 MG tablet Take 81 mg by mouth daily.    Derrill Memo ON 02/05/2019] atorvastatin (LIPITOR) 10 MG tablet Take 1 tablet (10 mg total) by mouth 2 (two) times a week. 26 tablet 3  . Blood Glucose Monitoring Suppl (ONE TOUCH ULTRA 2) w/Device KIT USE TO TEST BLOOD SUGAR TWO TO THREE TIMES DAILY 1 kit 0  . carvedilol (COREG) 6.25 MG tablet TAKE ONE TABLET BY MOUTH TWICE DAILY 180 tablet 0  . glipiZIDE (GLUCOTROL XL) 10 MG 24 hr tablet TAKE ONE (1) TABLET BY MOUTH EVERY DAY 90 tablet 3  .  JANUVIA 100 MG tablet TAKE 1 TABLET (100 MG TOTAL) BY MOUTH DAILY. 90 tablet 0  . metFORMIN (GLUCOPHAGE) 1000 MG tablet TAKE ONE TABLET BY MOUTH TWICE DAILY 180 tablet 0  . OneTouch Delica Lancets 18A MISC USE TO TEST BLOOD SUGAR TWO TO THREE TIMES DAILY 100 each 1  . ONETOUCH ULTRA test strip USE TO TEST BLOOD GLUCOSE 2 TO 3 TIMES A DAY 100 strip 3  . vitamin B-12 (CYANOCOBALAMIN) 100 MCG tablet      No current facility-administered medications for this visit.      Objective:  BP 135/82 Comment: peak of home #s from last 10 days- all others even lower  Pulse 90   Temp 98.5 F (36.9 C)   Ht _0  (1.6 m)   Wt 178 lb 9.6 oz (81 kg)   LMP  (LMP Unknown)   SpO2 99%   BMI 31.64 kg/m  Gen: NAD, resting comfortably CV: RRR no murmurs rubs or gallops Lungs: CTAB no crackles, wheeze, rhonchi Abdomen: soft/nontender/nondistended/normal bowel sounds. No rebound or guarding.  Ext: no edema Skin: warm, dry    Assessment and Plan   # Diabetes S: Compliant with Glipizide 4m increased from 5 mg last visit, Januvia 1032m Metformin 1000 twice a day CBGs- fasting average around 112, some fluctuations in day but no lows Exercise and diet- pt states her  exercise consists of walking and tennis, pt cooks -out of pool for 9 months Lab Results  Component Value Date   HGBA1C 6.6 (A) 02/03/2019   HGBA1C 7.0 (H) 07/21/2018   HGBA1C 6.8 (H) 12/10/2017   A/P: reasonable contorl- continue current meds. If a1c slips up could consider substitution of januvia for ozempic - not a big fan of sglt2 inhibitor class -taking b12 due to being on metformin  #hypertension S: compliant with amlodipine 10 mg, carvedilol 6.25 mg twice daily.  Remains well controlled at home  Home numbers 116-135/69-82  In last 10 days BP Readings from Last 3 Encounters:  02/03/19 135/82- peak of home #s over last 10 days  12/03/18 124/72  06/12/18 113/66  A/P: controlled at home- above # is the home # . Continue current  medicine   #hyperlipidemia/history of stroke S: compliant with atorvastatin 10 mg once a week  Aspirin 81 mg will add back in- has not been taking lately.  Lab Results  Component Value Date   CHOL 162 07/21/2018   HDL 67.60 07/21/2018   LDLCALC 84 07/21/2018   LDLDIRECT 96.0 12/10/2017   TRIG 50.0 07/21/2018   CHOLHDL 2 07/21/2018   A/P: Ideal LDL less than 70-we will increase atorvastatin to twice a week and update at next visit. Also add aspirin back in with stroke history   Recommended follow up: already scheduled for physical- look forward to seeing you in april Future Appointments  Date Time Provider [place]  07/07/2019  9:20 AM Marin Olp, MD [facility]   Lab/Order associations:   ICD-10-CM   1. Type 2 diabetes mellitus with mild nonproliferative retinopathy without macular edema, without long-term current use of insulin, unspecified laterality (HCC)  E11.3299 POCT HgB A1C  2. Hyperlipidemia, unspecified hyperlipidemia type  E78.5   3. Essential hypertension  I10     Meds ordered this encounter  Medications  . atorvastatin (LIPITOR) 10 MG tablet    Sig: Take 1 tablet (10 mg total) by mouth 2 (two) times a week.    Dispense:  26 tablet    Refill:  3    Return precautions advised.  Garret Reddish, MD

## 2019-03-05 ENCOUNTER — Other Ambulatory Visit: Payer: Self-pay | Admitting: Family Medicine

## 2019-03-23 ENCOUNTER — Other Ambulatory Visit: Payer: Self-pay | Admitting: Family Medicine

## 2019-04-17 ENCOUNTER — Ambulatory Visit (INDEPENDENT_AMBULATORY_CARE_PROVIDER_SITE_OTHER): Payer: PPO

## 2019-04-17 ENCOUNTER — Other Ambulatory Visit: Payer: Self-pay

## 2019-04-17 VITALS — BP 120/70

## 2019-04-17 DIAGNOSIS — Z1231 Encounter for screening mammogram for malignant neoplasm of breast: Secondary | ICD-10-CM | POA: Diagnosis not present

## 2019-04-17 DIAGNOSIS — Z780 Asymptomatic menopausal state: Secondary | ICD-10-CM

## 2019-04-17 DIAGNOSIS — Z0000 Encounter for general adult medical examination without abnormal findings: Secondary | ICD-10-CM

## 2019-04-17 NOTE — Progress Notes (Signed)
This visit is being conducted via phone call due to the COVID-19 pandemic. This patient has given me verbal consent via phone to conduct this visit, patient states they are participating from their home address. Some vital signs may be absent or patient reported.   Patient identification: identified by name, DOB, and current address.  Location provider: [facility], Office Persons participating in the virtual visit: Denman George LPN, patient and Dr. Garret Reddish   Subjective:   Alexandra Fletcher is a 69 y.o. female who presents for an Initial Medicare Annual Wellness Visit.  Review of Systems     Cardiac Risk Factors include: advanced age (>42mn, >>52women);diabetes mellitus;hypertension;dyslipidemia    Objective:    Today's Vitals   04/17/19 0949  BP: 120/70   There is no height or weight on file to calculate BMI.  Advanced Directives 04/17/2019 07/25/2017 02/13/2017  Does Patient Have a Medical Advance Directive? Yes No Yes  Type of Advance Directive Living will;Healthcare Power of ASan BrunoLiving will  Does patient want to make changes to medical advance directive? No - Patient declined - -  Copy of HLesliein Chart? No - copy requested - -    Current Medications (verified) Outpatient Encounter Medications as of 04/17/2019  Medication Sig  . amLODipine (NORVASC) 10 MG tablet TAKE ONE (1) TABLET BY MOUTH EVERY DAY  . aspirin EC 81 MG tablet Take 81 mg by mouth daily.  .Marland Kitchenatorvastatin (LIPITOR) 10 MG tablet Take 1 tablet (10 mg total) by mouth 2 (two) times a week.  . Blood Glucose Monitoring Suppl (ONE TOUCH ULTRA 2) w/Device KIT USE TO TEST BLOOD SUGAR TWO TO THREE TIMES DAILY  . carvedilol (COREG) 6.25 MG tablet TAKE ONE TABLET BY MOUTH TWICE DAILY  . glipiZIDE (GLUCOTROL XL) 10 MG 24 hr tablet TAKE ONE (1) TABLET BY MOUTH EVERY DAY  . JANUVIA 100 MG tablet TAKE 1 TABLET (100 MG TOTAL) BY MOUTH DAILY.  .  metFORMIN (GLUCOPHAGE) 1000 MG tablet TAKE ONE TABLET BY MOUTH TWICE DAILY  . OneTouch Delica Lancets 389VMISC USE TO TEST BLOOD SUGAR TWO TO THREE TIMES DAILY  . ONETOUCH ULTRA test strip USE TO TEST BLOOD GLUCOSE 2 TO 3 TIMES A DAY  . vitamin B-12 (CYANOCOBALAMIN) 100 MCG tablet    No facility-administered encounter medications on file as of 04/17/2019.    Allergies (verified) Lisinopril, Moxifloxacin, and Pioglitazone   History: Past Medical History:  Diagnosis Date  . Allergy   . Arthritis   . Cataract    mild x both eyes   . Cerebral thrombosis with cerebral infarction ([facility] 2002   diagnosis needs confirmed by review of [facility]  . Diabetes mellitus   . Heart murmur    1x per Dr. HLinna Darner not recurrent  . Hyperlipidemia   . Hypertension   . Partial rupture of Achilles tendon    After fluorquinolone use   Past Surgical History:  Procedure Laterality Date  . COLONOSCOPY  2008   neg  . DILATION AND CURETTAGE OF UTERUS     X2  . KNEE ARTHROSCOPY W/ MENISCAL REPAIR  2008    Dr WAdella Nissenknee  . Ovarian cysts removed      Dr FWarnell Forester . POLYPECTOMY  2003   polypoid colonic mucosa/ 2008 no polyps  . THUMB SURGERY  1983   [place]  [place]  Problem Relation Age of Onset  .  Liver disease Mother        ? malignancy after Hepatitis  . Diabetes Father   . Heart attack Father        CBAG @ 17  . Multiple myeloma Father   . Hyperlipidemia Father   . Hypertension Father   . Diabetes Brother        Type 1 DM  . Heart attack Brother 22  . Stroke Maternal Aunt        > 65  . Breast cancer Cousin   . Other Sister        died as child from pneumonia  . Healthy Brother   . Colon cancer Neg Hx   . Colon polyps Neg Hx   . Esophageal cancer Neg Hx   . Rectal cancer Neg Hx   . Stomach cancer Neg Hx    Social History   Socioeconomic History  . Marital status: Married    Spouse name: Not on file  . Number of children: 2   . Years of education: Not on file  . Highest education level: Not on file  Occupational History  . Occupation: Agricultural engineer  Tobacco Use  . Smoking status: Never Smoker  . Smokeless tobacco: Never Used  Substance and Sexual Activity  . Alcohol use: Yes    Alcohol/week: 7.0 standard drinks    Types: 7 Glasses of wine per week  . Drug use: No  . Sexual activity: Yes    Birth control/protection: Post-menopausal  Other Topics Concern  . Not on file  Social History Narrative   Married 1976. 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 Difficulty of Paying Living Expenses: Not on file  Food Insecurity:   . Worried About Charity fundraiser in the Last Year: Not on file  . Ran Out of Food in the Last Year: Not on file  Transportation Needs:   . Lack of Transportation (Medical): Not on file  . Lack of Transportation (Non-Medical): Not on file  Physical Activity:   . Days of Exercise per Week: Not on file  . Minutes of Exercise per Session: Not on file  Stress:   . Feeling of Stress : Not on file  Social Connections:   . Frequency of Communication with Friends and Family: Not on file  . Frequency of Social Gatherings with Friends and Family: Not on file  . Attends Religious Services: Not on file  . Active Member of Clubs or Organizations: Not on file  . Attends Archivist Meetings: Not on file  . Marital Status: Not on file    Tobacco Counseling Counseling given: Not Answered   Clinical Intake:           Diabetes: Yes CBG done?: No Did pt. bring in CBG monitor from home?: No(reports fasting glucose to be 103 today)  How often do you need to have someone help you when you read instructions, pamphlets, or other written  materials from your doctor or pharmacy?: 1 - Never  Interpreter Needed?: No  Information entered by :: Denman George LPN   Activities of Daily Living In your present state of health, do you have any difficulty performing the following activities: 04/17/2019  Hearing? N  Vision? N  Difficulty concentrating or making decisions? N  Walking or climbing stairs? N  Dressing or bathing? N  Doing errands, shopping? N  Preparing Food and eating ? N  Using the Toilet? N  In the past six months, have you accidently leaked urine? N  Do you have problems with loss of bowel control? N  Managing your Medications? N  Managing your Finances? N  Housekeeping or managing your Housekeeping? N  Some recent data might be hidden     Immunizations and Health Maintenance Immunization History  Administered Date(s) Administered  . Influenza Split 02/22/2011, 12/03/2011  . Influenza Whole 01/15/2007, 12/17/2007, 03/03/2008, 12/07/2009  . Influenza, High Dose Seasonal PF 01/13/2016, 12/27/2016, 12/10/2017  . Influenza,inj,Quad PF,6+ Mos 01/20/2013  . Influenza-Unspecified 12/30/2014, 11/06/2018  . Pneumococcal Conjugate-13 02/21/2015  . Pneumococcal Polysaccharide-23 01/24/2017  . Td 05/21/2002  . Tdap 02/11/2013  . Zoster 01/05/2011  . Zoster Recombinat (Shingrix) 02/12/2017, 06/03/2017   There are no preventive care reminders to display for this patient.  Patient Care Team: Marin Olp, MD as PCP - General (Family Medicine) Gerda Diss, DO as Consulting Physician (Sports Medicine)  Indicate any recent Medical Services you may have received from other than [facility] providers in the past year (date may be approximate).     Assessment:   This is a routine wellness examination for [facility].  Hearing/Vision screen No exam data present  Dietary issues and exercise activities discussed: Current Exercise Habits: Home exercise routine, Type of exercise: walking, Time (Minutes): 30,  Frequency (Times/Week): 4, Weekly Exercise (Minutes/Week): 120, Intensity: Moderate  Goals   None    Depression Screen PHQ 2/9 Scores 04/17/2019 02/03/2019 06/12/2018 03/06/2017 02/23/2016 02/21/2015 12/17/2012  PHQ - 2 Score 0 0 0 0 0 0 0    Fall Risk Fall Risk  04/17/2019 10/17/2018 03/06/2017 02/23/2016 02/21/2015  Falls in the past year? 0 0 No No No  Comment - Emmi Telephone Survey: data to providers prior to load - - -  Number falls in past yr: 0 - - - -  Injury with Fall? 0 - - - -  Follow up Falls evaluation completed;Education provided;Falls prevention discussed - - - -    Is the patient's home free of loose throw rugs in walkways, pet beds, electrical cords, etc?   yes      Grab bars in the bathroom? yes      Handrails on the stairs?   yes      Adequate lighting?   yes   Cognitive Function:     6CIT Screen 04/17/2019  What Year? 0 points  What month? 0 points  What time? 0 points  Count back from 20 0 points  Months in reverse 0 points  Repeat phrase 0 points  Total Score 0    Screening Tests Health Maintenance  Topic Date Due  . OPHTHALMOLOGY EXAM  05/02/2019  . URINE MICROALBUMIN  07/21/2019  . HEMOGLOBIN A1C  08/03/2019  . FOOT EXAM  08/26/2019  . MAMMOGRAM  01/01/2020  . COLONOSCOPY  07/26/2022  . TETANUS/TDAP  02/12/2023  . INFLUENZA VACCINE  Completed  . DEXA SCAN  Completed  . Hepatitis C Screening  Completed  . PNA vac Low Risk Adult  Completed    Qualifies for Shingles Vaccine? Shingrix completed   Cancer Screenings: Lung: Low Dose CT Chest recommended if Age 77-80 years, 30 pack-year currently smoking OR have quit w/in 15years. Patient does not qualify. Breast: Up to  date on Mammogram? Yes   Up to date of Bone Density/Dexa? Yes Colorectal: colonoscopy 07/25/17    Plan:  I have personally reviewed and addressed the Medicare Annual Wellness questionnaire and have noted the following in the patient's chart:  A. Medical and social history B. Use of  alcohol, tobacco or illicit drugs  C. Current medications and supplements D. Functional ability and status E.  Nutritional status F.  Physical activity G. Advance directives H. List of other physicians I.  Hospitalizations, surgeries, and ER visits in previous 12 months J.  [place] such as hearing and vision if needed, cognitive and depression L. Referrals, records requested, and appointments- mammogram and dexa ordered   In addition, I have reviewed and discussed with patient certain preventive protocols, quality metrics, and best practice recommendations. A written personalized care plan for preventive services as well as general preventive health recommendations were provided to patient.   Signed,  Denman George, LPN  Nurse Health Advisor   Nurse Notes: no additional

## 2019-04-17 NOTE — Patient Instructions (Signed)
[name] , Thank you for taking time to come for your Medicare Wellness Visit. I appreciate your ongoing commitment to your health goals. Please review the following plan we discussed and let me know if I can assist you in the future.   Screening recommendations/referrals: Colorectal Screening: up to date; last colonoscopy 07/25/17 Mammogram: up to date; last 12/31/17 (orders placed to be repeated 12/2019) Bone Density: orders placed to be repeated 12/2019  Vision and Dental Exams: Recommended annual ophthalmology exams for early detection of glaucoma and other disorders of the eye Recommended annual dental exams for proper oral hygiene  Diabetic Exams: Diabetic Eye Exam: recommended yearly; up to date Diabetic Foot Exam: recommended yearly; up to date   Vaccinations: Influenza vaccine: completed 11/06/18 Pneumococcal vaccine: up to date; last 01/24/17 Tdap vaccine: up to date; last 02/11/13  Shingles vaccine: Shingrix completed   Advanced directives: Please bring a copy of your POA (Power of [place]) and/or Living Will to your next appointment.  Goals: Recommend to drink at least 6-8 8oz glasses of water per day and consume a balanced diet rich in fresh fruits and vegetables.   Next appointment: Please schedule your Annual Wellness Visit with your Nurse Health Advisor in one year.  Preventive Care 70 Years and Older, Female Preventive care refers to lifestyle choices and visits with your health care provider that can promote health and wellness. What does preventive care include?  A yearly physical exam. This is also called an annual well check.  Dental exams once or twice a year.  Routine eye exams. Ask your health care provider how often you should have your eyes checked.  Personal lifestyle choices, including:  Daily care of your teeth and gums.  Regular physical activity.  Eating a healthy diet.  Avoiding tobacco and drug use.  Limiting alcohol use.  Practicing  safe sex.  Taking low-dose aspirin every day if recommended by your health care provider.  Taking vitamin and mineral supplements as recommended by your health care provider. What happens during an annual well check? The services and screenings done by your health care provider during your annual well check will depend on your age, overall health, lifestyle risk factors, and family history of disease. Counseling  Your health care provider may ask you questions about your:  Alcohol use.  Tobacco use.  Drug use.  Emotional well-being.  Home and relationship well-being.  Sexual activity.  Eating habits.  History of falls.  Memory and ability to understand (cognition).  Work and work Statistician.  Reproductive health. Screening  You may have the following tests or measurements:  Height, weight, and BMI.  Blood pressure.  Lipid and cholesterol levels. These may be checked every 5 years, or more frequently if you are over 70 years old.  Skin check.  Lung cancer screening. You may have this screening every year starting at age 70 if you have a 30-pack-year history of smoking and currently smoke or have quit within the past 15 years.  Fecal occult blood test (FOBT) of the stool. You may have this test every year starting at age 70.  Flexible sigmoidoscopy or colonoscopy. You may have a sigmoidoscopy every 5 years or a colonoscopy every 10 years starting at age 70.  Hepatitis C blood test.  Hepatitis B blood test.  Sexually transmitted disease (STD) testing.  Diabetes screening. This is done by checking your blood sugar (glucose) after you have not eaten for a while (fasting). You may have this done every 1-3  years.  Bone density scan. This is done to screen for osteoporosis. You may have this done starting at age 70.  Mammogram. This may be done every 1-2 years. Talk to your health care provider about how often you should have regular mammograms. Talk with your  health care provider about your test results, treatment options, and if necessary, the need for more tests. Vaccines  Your health care provider may recommend certain vaccines, such as:  Influenza vaccine. This is recommended every year.  Tetanus, diphtheria, and acellular pertussis (Tdap, Td) vaccine. You may need a Td booster every 10 years.  Zoster vaccine. You may need this after age 70.  Pneumococcal 13-valent conjugate (PCV13) vaccine. One dose is recommended after age 70.  Pneumococcal polysaccharide (PPSV23) vaccine. One dose is recommended after age 70. Talk to your health care provider about which screenings and vaccines you need and how often you need them. This information is not intended to replace advice given to you by your health care provider. Make sure you discuss any questions you have with your health care provider. Document Released: 04/01/2015 Document Revised: 11/23/2015 Document Reviewed: 01/04/2015 Elsevier Interactive Patient Education  2017 [place] Prevention in the Home Falls can cause injuries. They can happen to people of all ages. There are many things you can do to make your home safe and to help prevent falls. What can I do on the outside of my home?  Regularly fix the edges of walkways and driveways and fix any cracks.  Remove anything that might make you trip as you walk through a door, such as a raised step or threshold.  Trim any bushes or trees on the path to your home.  Use bright outdoor lighting.  Clear any walking paths of anything that might make someone trip, such as rocks or tools.  Regularly check to see if handrails are loose or broken. Make sure that both sides of any steps have handrails.  Any raised decks and porches should have guardrails on the edges.  Have any leaves, snow, or ice cleared regularly.  Use sand or salt on walking paths during winter.  Clean up any spills in your garage right away. This includes oil  or grease spills. What can I do in the bathroom?  Use night lights.  Install grab bars by the toilet and in the tub and shower. Do not use towel bars as grab bars.  Use non-skid mats or decals in the tub or shower.  If you need to sit down in the shower, use a plastic, non-slip stool.  Keep the floor dry. Clean up any water that spills on the floor as soon as it happens.  Remove soap buildup in the tub or shower regularly.  Attach bath mats securely with double-sided non-slip rug tape.  Do not have throw rugs and other things on the floor that can make you trip. What can I do in the bedroom?  Use night lights.  Make sure that you have a light by your bed that is easy to reach.  Do not use any sheets or blankets that are too big for your bed. They should not hang down onto the floor.  Have a firm chair that has side arms. You can use this for support while you get dressed.  Do not have throw rugs and other things on the floor that can make you trip. What can I do in the kitchen?  Clean up any spills right away.  Avoid walking on wet floors.  Keep items that you use a lot in easy-to-reach places.  If you need to reach something above you, use a strong step stool that has a grab bar.  Keep electrical cords out of the way.  Do not use floor polish or wax that makes floors slippery. If you must use wax, use non-skid floor wax.  Do not have throw rugs and other things on the floor that can make you trip. What can I do with my stairs?  Do not leave any items on the stairs.  Make sure that there are handrails on both sides of the stairs and use them. Fix handrails that are broken or loose. Make sure that handrails are as long as the stairways.  Check any carpeting to make sure that it is firmly attached to the stairs. Fix any carpet that is loose or worn.  Avoid having throw rugs at the top or bottom of the stairs. If you do have throw rugs, attach them to the floor with  carpet tape.  Make sure that you have a light switch at the top of the stairs and the bottom of the stairs. If you do not have them, ask someone to add them for you. What else can I do to help prevent falls?  Wear shoes that:  Do not have high heels.  Have rubber bottoms.  Are comfortable and fit you well.  Are closed at the toe. Do not wear sandals.  If you use a stepladder:  Make sure that it is fully opened. Do not climb a closed stepladder.  Make sure that both sides of the stepladder are locked into place.  Ask someone to hold it for you, if possible.  Clearly mark and make sure that you can see:  Any grab bars or handrails.  First and last steps.  Where the edge of each step is.  Use tools that help you move around (mobility aids) if they are needed. These include:  Canes.  Walkers.  Scooters.  Crutches.  Turn on the lights when you go into a dark area. Replace any light bulbs as soon as they burn out.  Set up your furniture so you have a clear path. Avoid moving your furniture around.  If any of your floors are uneven, fix them.  If there are any pets around you, be aware of where they are.  Review your medicines with your doctor. Some medicines can make you feel dizzy. This can increase your chance of falling. Ask your doctor what other things that you can do to help prevent falls. This information is not intended to replace advice given to you by your health care provider. Make sure you discuss any questions you have with your health care provider. Document Released: 12/30/2008 Document Revised: 08/11/2015 Document Reviewed: 04/09/2014 Elsevier Interactive Patient Education  2017 [employer].

## 2019-04-18 ENCOUNTER — Ambulatory Visit: Payer: PPO

## 2019-04-24 ENCOUNTER — Encounter: Payer: Self-pay | Admitting: Family Medicine

## 2019-04-26 ENCOUNTER — Ambulatory Visit: Payer: PPO

## 2019-05-09 ENCOUNTER — Ambulatory Visit: Payer: PPO

## 2019-05-27 ENCOUNTER — Telehealth: Payer: Self-pay | Admitting: Family Medicine

## 2019-05-27 ENCOUNTER — Other Ambulatory Visit: Payer: Self-pay | Admitting: Family Medicine

## 2019-05-27 DIAGNOSIS — H43393 Other vitreous opacities, bilateral: Secondary | ICD-10-CM | POA: Diagnosis not present

## 2019-05-27 DIAGNOSIS — E113293 Type 2 diabetes mellitus with mild nonproliferative diabetic retinopathy without macular edema, bilateral: Secondary | ICD-10-CM | POA: Diagnosis not present

## 2019-05-27 DIAGNOSIS — H35412 Lattice degeneration of retina, left eye: Secondary | ICD-10-CM | POA: Diagnosis not present

## 2019-05-27 DIAGNOSIS — H25013 Cortical age-related cataract, bilateral: Secondary | ICD-10-CM | POA: Diagnosis not present

## 2019-05-27 NOTE — Telephone Encounter (Signed)
  LAST APPOINTMENT DATE: 05/27/2019   NEXT APPOINTMENT DATE:@4 /20/2021  MEDICATION:amLODipine (NORVASC) 10 MG tablet  PHARMACY:[facility] [place],  - [address]  COMMENTS: patient only has a couple days worth of medication    **Let patient know to contact pharmacy at the end of the day to make sure medication is ready. **  ** Please notify patient to allow 48-72 hours to process**  **Encourage patient to contact the pharmacy for refills or they can request refills through [facility]**  CLINICAL FILLS OUT ALL BELOW:   LAST REFILL:  QTY:  REFILL DATE:    OTHER COMMENTS:    Okay for refill?  Please advise

## 2019-05-28 ENCOUNTER — Other Ambulatory Visit: Payer: Self-pay

## 2019-05-28 MED ORDER — AMLODIPINE BESYLATE 10 MG PO TABS: 10.0000 mg | ORAL_TABLET | Freq: Every day | ORAL | 3 refills | Status: DC

## 2019-05-28 NOTE — Telephone Encounter (Signed)
Medication sent to pharmacy  

## 2019-05-28 NOTE — Telephone Encounter (Signed)
Pharmacy is calling back.  They have not received any scripts from out office in regard to medication.  Please call pharmacy at [phone].

## 2019-05-29 LAB — HM DIABETES EYE EXAM

## 2019-06-05 ENCOUNTER — Other Ambulatory Visit: Payer: Self-pay | Admitting: Family Medicine

## 2019-06-22 ENCOUNTER — Other Ambulatory Visit: Payer: Self-pay | Admitting: Family Medicine

## 2019-07-06 NOTE — Progress Notes (Signed)
Phone: [phone]   Subjective:  Patient presents today for their annual physical. Chief complaint-noted.   See problem oriented charting- Review of Systems  Constitutional: Negative for chills and fever.  HENT: Negative for ear pain, hearing loss and tinnitus.   Eyes: Negative for blurred vision, double vision and photophobia.       Cataract surgery in June   Respiratory: Negative for cough, shortness of breath and wheezing.   Cardiovascular: Negative for chest pain, palpitations and leg swelling.  Gastrointestinal: Negative for blood in stool, constipation, diarrhea, heartburn, nausea and vomiting.  Genitourinary: Negative for dysuria, frequency and urgency.  Musculoskeletal: Positive for joint pain. Negative for back pain and neck pain.       B/l hands in fingers.   Skin: Negative for rash.  Neurological: Negative for dizziness, weakness and headaches.  Endo/Heme/Allergies: Does not bruise/bleed easily.  Psychiatric/Behavioral: Negative for depression, hallucinations, memory loss and suicidal ideas. The patient does not have insomnia.    The following were reviewed and entered/updated in epic: Past Medical History:  Diagnosis Date  . Allergy   . Arthritis   . Cataract    mild x both eyes   . Cerebral thrombosis with cerebral infarction [facility]) 2002   diagnosis needs confirmed by review of [facility]  . Diabetes mellitus   . Heart murmur    1x per Dr. Linna Darner- not recurrent  . Hyperlipidemia   . Hypertension   . Partial rupture of Achilles tendon    After fluorquinolone use   Patient Active Problem List   Diagnosis Date Noted  . History of CVA - Acute right arterial ischemic stroke, PCA (posterior cerebral artery) 01/04/2013    Priority: High  . Type 2 diabetes mellitus with ophthalmic complication ([place]) 82/50/5397    Priority: High  . Diabetic retinopathy ([place]) 12/19/2014    Priority: Medium  . Hyperlipidemia associated with type 2 diabetes mellitus ([place])  08/16/2009    Priority: Medium  . Hypertension associated with diabetes ([place]) 08/16/2009    Priority: Medium  . Dysphagia, pharyngoesophageal phase 02/21/2015    Priority: Low  . Eosinophilia 12/03/2011    Priority: Low  . Sinus tarsi syndrome 01/31/2011    Priority: Low  . Baker's cyst of knee 10/10/2010    Priority: Low  . History of colonic polyps 08/16/2009    Priority: Low  . HEART MURMUR, BENIGN 05/16/2007    Priority: Low  . History of abnormal cervical Pap smear 04/21/2007    Priority: Low  . Type 2 diabetes mellitus with peripheral vascular disease ([place]) 07/07/2019  . Posterior tibial tendinitis of right lower extremity 04/01/2018   Past Surgical History:  Procedure Laterality Date  . COLONOSCOPY  2008   neg  . DILATION AND CURETTAGE OF UTERUS     X2  . KNEE ARTHROSCOPY W/ MENISCAL REPAIR  2008    Dr Adella Nissen knee  . Ovarian cysts removed      Dr Warnell Forester  . POLYPECTOMY  2003   polypoid colonic mucosa/ 2008 no polyps  . THUMB SURGERY  1983   Left; post skiing accident in [place]    Family History  Problem Relation Age of Onset  . Liver disease Mother        ? malignancy after Hepatitis  . Diabetes Father   . Heart attack Father        CBAG @ 48  . Multiple myeloma Father   . Hyperlipidemia Father   . Hypertension Father   . Diabetes  Brother        Type 1 DM  . Heart attack Brother 42  . Stroke Maternal Aunt        > 65  . Breast cancer Cousin   . Other Sister        died as child from pneumonia  . Healthy Brother   . Colon cancer Neg Hx   . Colon polyps Neg Hx   . Esophageal cancer Neg Hx   . Rectal cancer Neg Hx   . Stomach cancer Neg Hx     Medications- reviewed and updated Current Outpatient Medications  Medication Sig Dispense Refill  . amLODipine (NORVASC) 10 MG tablet Take 1 tablet (10 mg total) by mouth daily. 90 tablet 3  . aspirin EC 81 MG tablet Take 81 mg by mouth daily.    Marland Kitchen atorvastatin (LIPITOR) 10 MG tablet Take 1 tablet (10  mg total) by mouth 2 (two) times a week. 26 tablet 3  . Blood Glucose Monitoring Suppl (ONE TOUCH ULTRA 2) w/Device KIT USE TO TEST BLOOD SUGAR TWO TO THREE TIMES DAILY 1 kit 0  . carvedilol (COREG) 6.25 MG tablet TAKE ONE TABLET BY MOUTH TWICE DAILY 180 tablet 0  . glipiZIDE (GLUCOTROL XL) 10 MG 24 hr tablet TAKE ONE (1) TABLET BY MOUTH EVERY DAY 90 tablet 3  . JANUVIA 100 MG tablet TAKE 1 TABLET (100 MG TOTAL) BY MOUTH DAILY. 90 tablet 2  . metFORMIN (GLUCOPHAGE) 1000 MG tablet TAKE ONE TABLET BY MOUTH TWICE DAILY 180 tablet 0  . OneTouch Delica Lancets 75F MISC USE TO TEST BLOOD SUGAR TWO TO THREE TIMES DAILY 100 each 1  . ONETOUCH ULTRA test strip USE TO TEST BLOOD GLUCOSE 2 TO 3 TIMES A DAY 100 strip 3  . vitamin B-12 (CYANOCOBALAMIN) 100 MCG tablet      No current facility-administered medications for this visit.    Allergies-reviewed and updated Allergies  Allergen Reactions  . Lisinopril     Swelling of tongue  . Moxifloxacin     Achilles tendon rupture potentially  . Pioglitazone     REACTION: weight gain & ankle edema    Social History   Social History Narrative   Married 1976. 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 Oropharynx normal Neck: no thyromegaly CV: RRR no murmurs rubs or gallops Lungs: CTAB no crackles, wheeze, rhonchi Abdomen: soft/nontender/nondistended/normal bowel sounds. No rebound or guarding.  Ext: no edema Skin: warm, dry Neuro: grossly normal, moves all extremities, PERRLA  Breasts: normal appearance, no masses or tenderness       Assessment and Plan   69 y.o. female presenting for annual physical.  Health Maintenance counseling: 1. Anticipatory guidance: Patient counseled regarding regular dental exams q6 months, eye exams yearly ,  avoiding smoking and second hand smoke , limiting alcohol to 1 beverage per day- one glass a wine once or twice a week.    2. Risk factor reduction:  Advised patient of need for regular exercise and diet rich and fruits and vegetables to reduce risk of heart attack and stroke. 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 Diet-has healthy diet. Does not eat out.  Down 3 lbs from last visit- congratulated on efforts! Wt Readings from Last 3 Encounters:  07/07/19 175 lb (79.4 kg)  02/03/19 178 lb 9.6 oz (81 kg)  08/26/18 179 lb 9.6 oz (81.5 kg)  3. Immunizations/screenings/ancillary studies- full up to date  Immunization History  Administered Date(s) Administered  . Influenza Split 02/22/2011, 12/03/2011  . Influenza Whole 01/15/2007, 12/17/2007, 03/03/2008, 12/07/2009  . Influenza, High Dose Seasonal PF 01/13/2016, 12/27/2016, 12/10/2017  . Influenza,inj,Quad PF,6+ Mos 01/20/2013  . Influenza-Unspecified 12/30/2014, 11/06/2018  . PFIZER SARS-COV-2 Vaccination 04/04/2019, 04/22/2019  . Pneumococcal Conjugate-13 02/21/2015  . Pneumococcal Polysaccharide-23 01/24/2017  . Td 05/21/2002  . Tdap 02/11/2013  . Zoster 01/05/2011  . Zoster Recombinat (Shingrix) 02/12/2017, 06/03/2017  4. Cervical cancer screening- . No GYN at present- last saw Elon Alas, NP.  can not remember last Pap .  We had a history of abnormal Pap smear listed in 2009 on problem list she does not recall being told that-at last physical in 2018 we discussed 1 final visit with final recommendations-she has not seen them.  We are going to try to get records instead.  5. Breast cancer screening-  self breast exam monthly  and mammogram - order has been placed information on AVS to call for appointment   6. Colon cancer screening  -  last done 07/25/2017 repeat in 5 years. History of colonic polyps 7. Skin cancer screening- Followed by Dr. 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 Denies worrisome, changing, or new skin lesions. -has area on left side of chin (she will check in august- does not look obviously malignant) and upper back (small cyst) 8. Birth control/STD check-  In monogamous relation ship declines at this time.  9. Osteoporosis screening at 57- normal at age 46- no repeat required -Never smoker  Status of chronic or acute concerns   # Diabetes mellitus with retinopathy ([place]) S: Compliant with Glipizide 69m increased from 5 mg last visit, Januvia 1023m Metformin 1000 twice a day CBGs- at home averages 130-140 checks several times a day. 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  Lab Results  Component Value Date   HGBA1C 6.6 (A) 02/03/2019   HGBA1C 7.0 (H) 07/21/2018   HGBA1C 6.8 (H) 12/10/2017   A/P: Hopefully controlled-update hemoglobin A1c today-suspect we will continue current medications  Low normal B12 last visit likely related to Metformin-she is taking over-the-counter B12.  We will check another B12 level today Lab Results  Component Value Date   VITAMINB12 234 07/21/2018    Patient with mild diabetic retinopathy-has close follow-up with ophthalmology- full exam mid march- .  We will continue to try to maximize diabetes control  #hypertension/history of CVA/hyperlipidemia S: compliant with amlodipine 10 mg, carvedilol 6.25 mg twice daily.  Home average 122/73 .   Compliant with atorvastatin 1052mwice a week. BP Readings from Last 3 Encounters:  07/07/19 128/82  04/17/19 120/70  02/03/19 135/82  A/P: Hypertension well-controlled-continue current medication  Hyperlipidemia-Last LDL was 84- we discussed ideally would be under 70.  Update lipid panel today-may need to increase dose- possibly 52m24md continue to work on  lifestyle  For history of CVA-patient remains on aspirin 81 mg daily-continue current medication  Recommended follow  up: Return in about 6 months (around 01/06/2020) for follow up- or sooner if needed as long as a1c is 7 or less.  Lab/Order associations:not fasting   ICD-10-CM   1. Preventative health care  Z00.00 Vitamin B12  2. History of CVA - Acute right arterial ischemic stroke, PCA (posterior cerebral artery)  Z86.73   3. Type 2 diabetes mellitus with mild nonproliferative retinopathy without macular edema, without long-term current use of insulin, unspecified laterality (HCC)  E11.3299 CBC with Differential/Platelet    Comprehensive metabolic panel    Lipid panel    Hemoglobin A1c    Microalbumin / creatinine urine ratio  4. Hypertension associated with diabetes ([place])  E11.59    I10   5. Hyperlipidemia associated with type 2 diabetes mellitus (HCC)  E11.69    E78.5   6. Mild nonproliferative diabetic retinopathy of both eyes without macular edema associated with type 2 diabetes mellitus ([place])  Y05.1102   7. Type 2 diabetes mellitus with peripheral vascular disease (HCC)  E11.51   8. History of colonic polyps  Z86.010   9. High risk medication use  Z79.899 Vitamin B12    No orders of the defined types were placed in this encounter.   Return precautions advised.  Garret Reddish, MD

## 2019-07-07 ENCOUNTER — Ambulatory Visit (INDEPENDENT_AMBULATORY_CARE_PROVIDER_SITE_OTHER): Payer: PPO | Admitting: Family Medicine

## 2019-07-07 ENCOUNTER — Other Ambulatory Visit: Payer: Self-pay

## 2019-07-07 ENCOUNTER — Encounter: Payer: Self-pay | Admitting: Family Medicine

## 2019-07-07 VITALS — BP 128/82 | HR 87 | Temp 98.4°F | Ht 63.0 in | Wt 175.0 lb

## 2019-07-07 DIAGNOSIS — Z8673 Personal history of transient ischemic attack (TIA), and cerebral infarction without residual deficits: Secondary | ICD-10-CM | POA: Diagnosis not present

## 2019-07-07 DIAGNOSIS — Z0000 Encounter for general adult medical examination without abnormal findings: Secondary | ICD-10-CM

## 2019-07-07 DIAGNOSIS — Z860100 Personal history of colon polyps, unspecified: Secondary | ICD-10-CM

## 2019-07-07 DIAGNOSIS — E1169 Type 2 diabetes mellitus with other specified complication: Secondary | ICD-10-CM | POA: Diagnosis not present

## 2019-07-07 DIAGNOSIS — E1159 Type 2 diabetes mellitus with other circulatory complications: Secondary | ICD-10-CM

## 2019-07-07 DIAGNOSIS — I10 Essential (primary) hypertension: Secondary | ICD-10-CM | POA: Diagnosis not present

## 2019-07-07 DIAGNOSIS — E1151 Type 2 diabetes mellitus with diabetic peripheral angiopathy without gangrene: Secondary | ICD-10-CM | POA: Insufficient documentation

## 2019-07-07 DIAGNOSIS — E113299 Type 2 diabetes mellitus with mild nonproliferative diabetic retinopathy without macular edema, unspecified eye: Secondary | ICD-10-CM | POA: Diagnosis not present

## 2019-07-07 DIAGNOSIS — Z79899 Other long term (current) drug therapy: Secondary | ICD-10-CM | POA: Diagnosis not present

## 2019-07-07 DIAGNOSIS — E785 Hyperlipidemia, unspecified: Secondary | ICD-10-CM | POA: Diagnosis not present

## 2019-07-07 DIAGNOSIS — E113293 Type 2 diabetes mellitus with mild nonproliferative diabetic retinopathy without macular edema, bilateral: Secondary | ICD-10-CM

## 2019-07-07 DIAGNOSIS — I152 Hypertension secondary to endocrine disorders: Secondary | ICD-10-CM

## 2019-07-07 LAB — COMPREHENSIVE METABOLIC PANEL
ALT: 25 U/L (ref 0–35)
AST: 18 U/L (ref 0–37)
Albumin: 4.7 g/dL (ref 3.5–5.2)
Alkaline Phosphatase: 66 U/L (ref 39–117)
BUN: 21 mg/dL (ref 6–23)
CO2: 28 mEq/L (ref 19–32)
Calcium: 9.9 mg/dL (ref 8.4–10.5)
Chloride: 98 mEq/L (ref 96–112)
Creatinine, Ser: 0.81 mg/dL (ref 0.40–1.20)
GFR: 70 mL/min (ref >60.00)
Glucose, Bld: 195 mg/dL — ABNORMAL HIGH (ref 70–99)
Potassium: 4.1 mEq/L (ref 3.5–5.1)
Sodium: 136 mEq/L (ref 135–145)
Total Bilirubin: 0.6 mg/dL (ref 0.2–1.2)
Total Protein: 7.2 g/dL (ref 6.0–8.3)

## 2019-07-07 LAB — CBC WITH DIFFERENTIAL/PLATELET
Basophils Absolute: 0.1 10*3/uL (ref 0.0–0.1)
Basophils Relative: 0.8 % (ref 0.0–3.0)
Eosinophils Absolute: 0.9 10*3/uL — ABNORMAL HIGH (ref 0.0–0.7)
Eosinophils Relative: 10.7 % — ABNORMAL HIGH (ref 0.0–5.0)
HCT: 43.4 % (ref 36.0–46.0)
Hemoglobin: 14.5 g/dL (ref 12.0–15.0)
Lymphocytes Relative: 22.7 % (ref 12.0–46.0)
Lymphs Abs: 2 10*3/uL (ref 0.7–4.0)
MCHC: 33.4 g/dL (ref 30.0–36.0)
MCV: 91.2 fl (ref 78.0–100.0)
Monocytes Absolute: 0.5 10*3/uL (ref 0.1–1.0)
Monocytes Relative: 5.7 % (ref 3.0–12.0)
Neutro Abs: 5.2 10*3/uL (ref 1.4–7.7)
Neutrophils Relative %: 60.1 % (ref 43.0–77.0)
Platelets: 270 10*3/uL (ref 150.0–400.0)
RBC: 4.76 Mil/uL (ref 3.87–5.11)
RDW: 13.5 % (ref 11.5–15.5)
WBC: 8.6 10*3/uL (ref 4.0–10.5)

## 2019-07-07 LAB — LIPID PANEL
Cholesterol: 179 mg/dL (ref 0–200)
HDL: 75.4 mg/dL (ref >39.00)
LDL Cholesterol: 89 mg/dL (ref 0–99)
NonHDL: 103.98
Total CHOL/HDL Ratio: 2
Triglycerides: 73 mg/dL (ref 0.0–149.0)
VLDL: 14.6 mg/dL (ref 0.0–40.0)

## 2019-07-07 LAB — MICROALBUMIN / CREATININE URINE RATIO
Creatinine,U: 62.7 mg/dL
Microalb Creat Ratio: 5.5 mg/g (ref 0.0–30.0)
Microalb, Ur: 3.5 mg/dL — ABNORMAL HIGH (ref 0.0–1.9)

## 2019-07-07 LAB — VITAMIN B12: Vitamin B-12: 1483 pg/mL — ABNORMAL HIGH (ref 211–911)

## 2019-07-07 LAB — HEMOGLOBIN A1C: Hgb A1c MFr Bld: 7 % — ABNORMAL HIGH (ref 4.6–6.5)

## 2019-07-07 NOTE — Patient Instructions (Addendum)
You are doing awesome! Sugars look great. Blood pressure looks great. Weight down 3 lbs- keep up the great work!   Please stop by lab before you go If you do not have mychart- we will call you about results within 5 business days of [place] receiving them.  If you have mychart- we will send your results within 3 business days of [place] receiving them.  If abnormal or we want to clarify a result, we will call or mychart you to make sure you receive the message.  If you have questions or concerns or don't hear within 5 business days, please send [place] a message or call us.   Recommended follow up: Return in about 6 months (around 01/06/2020) for follow up- or sooner if needed as long as a1c is 7 or less.   You have an appointment scheduled for: []   2D Mammogram  [x]   3D Mammogram  [x]   Bone Density - courtney ordered this but with last bone density normal in 2018 you can defer this for now if youd like Will call for app    Your appointment will at the following location  [x]   [facility]      [place], [place]           Make sure to wear two peace clothing  No lotions powders or deodorants the day of the appointment Make sure to bring picture ID and insurance card.  Bring list of medications you are currently taking including any supplements.

## 2019-07-07 NOTE — Addendum Note (Signed)
Addended by: Marin Olp on: 07/07/2019 09:01 PM   Modules accepted: Level of Service

## 2019-07-08 ENCOUNTER — Encounter: Payer: Self-pay | Admitting: Family Medicine

## 2019-07-08 ENCOUNTER — Other Ambulatory Visit: Payer: Self-pay

## 2019-07-08 MED ORDER — ATORVASTATIN CALCIUM 20 MG PO TABS: ORAL_TABLET | ORAL | 3 refills | Status: DC

## 2019-07-08 NOTE — Telephone Encounter (Signed)
FYI I have spoke to patient. New script called in.

## 2019-08-13 DIAGNOSIS — H2513 Age-related nuclear cataract, bilateral: Secondary | ICD-10-CM | POA: Diagnosis not present

## 2019-08-20 DIAGNOSIS — H25812 Combined forms of age-related cataract, left eye: Secondary | ICD-10-CM | POA: Diagnosis not present

## 2019-08-20 DIAGNOSIS — H52202 Unspecified astigmatism, left eye: Secondary | ICD-10-CM | POA: Diagnosis not present

## 2019-08-21 DIAGNOSIS — Z7984 Long term (current) use of oral hypoglycemic drugs: Secondary | ICD-10-CM | POA: Diagnosis not present

## 2019-08-21 DIAGNOSIS — Z79899 Other long term (current) drug therapy: Secondary | ICD-10-CM | POA: Diagnosis not present

## 2019-08-21 DIAGNOSIS — Z7952 Long term (current) use of systemic steroids: Secondary | ICD-10-CM | POA: Diagnosis not present

## 2019-08-21 DIAGNOSIS — Z961 Presence of intraocular lens: Secondary | ICD-10-CM | POA: Diagnosis not present

## 2019-08-21 DIAGNOSIS — Z48810 Encounter for surgical aftercare following surgery on the sense organs: Secondary | ICD-10-CM | POA: Diagnosis not present

## 2019-08-21 DIAGNOSIS — E113293 Type 2 diabetes mellitus with mild nonproliferative diabetic retinopathy without macular edema, bilateral: Secondary | ICD-10-CM | POA: Diagnosis not present

## 2019-08-21 DIAGNOSIS — H43393 Other vitreous opacities, bilateral: Secondary | ICD-10-CM | POA: Diagnosis not present

## 2019-08-21 DIAGNOSIS — H35412 Lattice degeneration of retina, left eye: Secondary | ICD-10-CM | POA: Diagnosis not present

## 2019-08-24 ENCOUNTER — Other Ambulatory Visit: Payer: Self-pay | Admitting: Family Medicine

## 2019-09-08 DIAGNOSIS — H2511 Age-related nuclear cataract, right eye: Secondary | ICD-10-CM | POA: Diagnosis not present

## 2019-09-08 DIAGNOSIS — H25811 Combined forms of age-related cataract, right eye: Secondary | ICD-10-CM | POA: Diagnosis not present

## 2019-09-08 DIAGNOSIS — H52201 Unspecified astigmatism, right eye: Secondary | ICD-10-CM | POA: Diagnosis not present

## 2019-09-09 DIAGNOSIS — Z961 Presence of intraocular lens: Secondary | ICD-10-CM | POA: Diagnosis not present

## 2019-09-09 DIAGNOSIS — E113293 Type 2 diabetes mellitus with mild nonproliferative diabetic retinopathy without macular edema, bilateral: Secondary | ICD-10-CM | POA: Diagnosis not present

## 2019-09-09 DIAGNOSIS — Z79899 Other long term (current) drug therapy: Secondary | ICD-10-CM | POA: Diagnosis not present

## 2019-09-09 DIAGNOSIS — Z7984 Long term (current) use of oral hypoglycemic drugs: Secondary | ICD-10-CM | POA: Diagnosis not present

## 2019-09-09 DIAGNOSIS — Z48810 Encounter for surgical aftercare following surgery on the sense organs: Secondary | ICD-10-CM | POA: Diagnosis not present

## 2019-09-09 DIAGNOSIS — Z7952 Long term (current) use of systemic steroids: Secondary | ICD-10-CM | POA: Diagnosis not present

## 2019-09-15 ENCOUNTER — Other Ambulatory Visit: Payer: Self-pay | Admitting: Family Medicine

## 2019-10-05 ENCOUNTER — Other Ambulatory Visit: Payer: Self-pay | Admitting: Family Medicine

## 2019-10-23 ENCOUNTER — Telehealth: Payer: Self-pay | Admitting: Family Medicine

## 2019-10-23 NOTE — Progress Notes (Signed)
  Chronic Care Management   Outreach Note  10/23/2019 Name: Afrah Burlison MRN: [id number] DOB: April 12, 1950  Referred by: Marin Olp, MD Reason for referral : No chief complaint on file.   An unsuccessful telephone outreach was attempted today. The patient was referred to the pharmacist for assistance with care management and care coordination.   Follow Up Plan:   Earney [place] Upstream Scheduler

## 2019-11-04 ENCOUNTER — Other Ambulatory Visit: Payer: Self-pay | Admitting: Family Medicine

## 2019-11-06 ENCOUNTER — Telehealth: Payer: Self-pay | Admitting: Family Medicine

## 2019-11-06 NOTE — Progress Notes (Signed)
  Chronic Care Management   Note  11/06/2019 Name: Alexandra Fletcher MRN: [id number] DOB: 11/05/1950  Alexandra Fletcher is a 69 y.o. year old female who is a primary care patient of Marin Olp, MD. I reached out to [facility] by phone today in response to a referral sent by [name]'s PCP, Yong Channel Brayton Mars, MD.   [name] was given information about Chronic Care Management services today including:  1. CCM service includes personalized support from designated clinical staff supervised by her physician, including individualized plan of care and coordination with other care providers 2. 24/7 contact phone numbers for assistance for urgent and routine care needs. 3. Service will only be billed when office clinical staff spend 20 minutes or more in a month to coordinate care. 4. Only one practitioner may furnish and bill the service in a calendar month. 5. The patient may stop CCM services at any time (effective at the end of the month) by phone call to the office staff.   Patient agreed to services and verbal consent obtained.   Follow up plan:   Earney Hamburg Upstream Scheduler

## 2019-11-10 ENCOUNTER — Other Ambulatory Visit: Payer: Self-pay

## 2019-11-10 ENCOUNTER — Telehealth: Payer: Self-pay | Admitting: Family Medicine

## 2019-11-10 ENCOUNTER — Ambulatory Visit
Admission: RE | Admit: 2019-11-10 | Discharge: 2019-11-10 | Disposition: A | Discharge status: Home / Self Care | Payer: PPO | Source: Ambulatory Visit | Attending: Family Medicine | Admitting: Family Medicine

## 2019-11-10 DIAGNOSIS — Z1231 Encounter for screening mammogram for malignant neoplasm of breast: Secondary | ICD-10-CM

## 2019-11-10 DIAGNOSIS — Z780 Asymptomatic menopausal state: Secondary | ICD-10-CM

## 2019-11-10 NOTE — Telephone Encounter (Signed)
Dexa ordered at [facility].

## 2019-11-10 NOTE — Telephone Encounter (Signed)
Patient called in and stated she called the breast center to get her DEXA scan schedule the told her she needed to get it done at [facility] since her last one was there.

## 2019-11-17 ENCOUNTER — Encounter: Payer: Self-pay | Admitting: Family Medicine

## 2019-11-17 DIAGNOSIS — L570 Actinic keratosis: Secondary | ICD-10-CM | POA: Diagnosis not present

## 2019-11-17 DIAGNOSIS — D225 Melanocytic nevi of trunk: Secondary | ICD-10-CM | POA: Diagnosis not present

## 2019-11-17 DIAGNOSIS — L821 Other seborrheic keratosis: Secondary | ICD-10-CM | POA: Diagnosis not present

## 2019-11-17 DIAGNOSIS — D485 Neoplasm of uncertain behavior of skin: Secondary | ICD-10-CM | POA: Diagnosis not present

## 2019-11-17 DIAGNOSIS — C44319 Basal cell carcinoma of skin of other parts of face: Secondary | ICD-10-CM | POA: Diagnosis not present

## 2019-11-17 DIAGNOSIS — L814 Other melanin hyperpigmentation: Secondary | ICD-10-CM | POA: Diagnosis not present

## 2019-12-14 ENCOUNTER — Ambulatory Visit (INDEPENDENT_AMBULATORY_CARE_PROVIDER_SITE_OTHER)
Admission: RE | Admit: 2019-12-14 | Discharge: 2019-12-14 | Disposition: A | Discharge status: Home / Self Care | Payer: PPO | Source: Ambulatory Visit | Attending: Family Medicine | Admitting: Family Medicine

## 2019-12-14 ENCOUNTER — Other Ambulatory Visit: Payer: Self-pay

## 2019-12-14 DIAGNOSIS — Z780 Asymptomatic menopausal state: Secondary | ICD-10-CM | POA: Diagnosis not present

## 2019-12-15 ENCOUNTER — Other Ambulatory Visit: Payer: Self-pay

## 2019-12-15 ENCOUNTER — Ambulatory Visit (INDEPENDENT_AMBULATORY_CARE_PROVIDER_SITE_OTHER): Payer: PPO

## 2019-12-15 DIAGNOSIS — Z23 Encounter for immunization: Secondary | ICD-10-CM

## 2020-01-04 ENCOUNTER — Other Ambulatory Visit: Payer: Self-pay | Admitting: Family Medicine

## 2020-01-05 NOTE — Patient Instructions (Addendum)
Great to see you today! No changes for now   Please stop by lab before you go If you have mychart- we will send your results within 3 business days of [place] receiving them.  If you do not have mychart- we will call you about results within 5 business days of [place] receiving them.  *please note we are currently using [facility] which has a longer processing time than  typically so labs may not come back as quickly as in the past *please also note that you will see labs on mychart as soon as they post. I will later go in and write notes on them- will say "notes from Dr. Yong Channel"

## 2020-01-05 NOTE — Progress Notes (Signed)
Phone [phone] In person visit   Subjective:   Alexandra Fletcher is a 69 y.o. year old very pleasant female patient who presents for/with See problem oriented charting Chief Complaint  Patient presents with  . Diabetes  . Hyperlipidemia  . Hypertension   This visit occurred during the SARS-CoV-2 public health emergency.  Safety protocols were in place, including screening questions prior to the visit, additional usage of staff PPE, and extensive cleaning of exam room while observing appropriate contact time as indicated for disinfecting solutions.   Past Medical History-  Patient Active Problem List   Diagnosis Date Noted  . History of CVA - Acute right arterial ischemic stroke, PCA (posterior cerebral artery) 01/04/2013    Priority: High  . Type 2 diabetes mellitus with ophthalmic complication ([place]) 03/83/3383    Priority: High  . Diabetic retinopathy ([place]) 12/19/2014    Priority: Medium  . Hyperlipidemia associated with type 2 diabetes mellitus ([place]) 08/16/2009    Priority: Medium  . Hypertension associated with diabetes ([place]) 08/16/2009    Priority: Medium  . Dysphagia, pharyngoesophageal phase 02/21/2015    Priority: Low  . Eosinophilia 12/03/2011    Priority: Low  . Sinus tarsi syndrome 01/31/2011    Priority: Low  . Baker's cyst of knee 10/10/2010    Priority: Low  . History of colonic polyps 08/16/2009    Priority: Low  . HEART MURMUR, BENIGN 05/16/2007    Priority: Low  . History of abnormal cervical Pap smear 04/21/2007    Priority: Low  . Type 2 diabetes mellitus with peripheral vascular disease ([place]) 07/07/2019  . Posterior tibial tendinitis of right lower extremity 04/01/2018    Medications- reviewed and updated Current Outpatient Medications  Medication Sig Dispense Refill  . amLODipine (NORVASC) 10 MG tablet Take 1 tablet (10 mg total) by mouth daily. 90 tablet 3  . aspirin EC 81 MG tablet Take 81 mg by mouth daily.    Marland Kitchen atorvastatin  (LIPITOR) 20 MG tablet 1 tab twice a week. 24 tablet 3  . Blood Glucose Monitoring Suppl (ONE TOUCH ULTRA 2) w/Device KIT USE TO TEST BLOOD SUGAR TWO TO THREE TIMES DAILY 1 kit 0  . carvedilol (COREG) 6.25 MG tablet TAKE ONE TABLET BY MOUTH TWICE DAILY 180 tablet 0  . glipiZIDE (GLUCOTROL XL) 10 MG 24 hr tablet TAKE ONE (1) TABLET BY MOUTH EVERY DAY 90 tablet 2  . JANUVIA 100 MG tablet TAKE 1 TABLET (100 MG TOTAL) BY MOUTH DAILY. 90 tablet 2  . metFORMIN (GLUCOPHAGE) 1000 MG tablet TAKE ONE TABLET BY MOUTH TWICE DAILY 180 tablet 0  . OneTouch Delica Lancets 29V MISC USE TO TEST BLOOD SUGAR TWO TO THREE TIMES DAILY 100 each 1  . ONETOUCH ULTRA test strip USE TO TEST BLOOD GLUCOSE 2 TO 3 TIMES A DAY 100 strip 3  . vitamin B-12 (CYANOCOBALAMIN) 100 MCG tablet      No current facility-administered medications for this visit.     Objective:  BP 126/80   Pulse 81   Temp 98.3 F (36.8 C) (Temporal)   Resp 18   Ht '5\' 3"'  (1.6 m)   Wt 176 lb 9.6 oz (80.1 kg)   LMP  (LMP Unknown)   SpO2 95%   BMI 31.28 kg/m  Gen: NAD, resting comfortably CV: RRR no murmurs rubs or gallops Lungs: CTAB no crackles, wheeze, rhonchi Abdomen: soft/nontender/nondistended/normal bowel sounds.  Ext: trace edema Skin: warm, dry  Diabetic Foot Exam - Simple   Simple  Foot Form Diabetic Foot exam was performed with the following findings: Yes 01/07/2020  8:19 AM  Visual Inspection No deformities, no ulcerations, no other skin breakdown bilaterally: Yes Sensation Testing Intact to touch and monofilament testing bilaterally: Yes Pulse Check Posterior Tibialis and Dorsalis pulse intact bilaterally: Yes Comments        Assessment and Plan   #hyperlipidemia/history of stroke S: Medication: atorvastatin 20Mg increased to twice a week last visit, she also takes aspirin due to history of prior stroke Lab Results  Component Value Date   CHOL 179 07/07/2019   HDL 75.40 07/07/2019   LDLCALC 89 07/07/2019    LDLDIRECT 96.0 12/10/2017   TRIG 73.0 07/07/2019   CHOLHDL 2 07/07/2019   A/P: We will check a direct LDL today with hopes this is trending down closer to 70-for now continue current medication  # Diabetes-history of diabetic retinopathy and history of stroke therefore peripheral vascular disease S: Medication: Metformin 1000Mg twice daily, Januvia 100Mg daily, Glipizide 10Mg daily CBGs-7-day average of 130, 14-day average of 137 and 30-day average of 143 on blood sugars. Higher in the summer months with cataract surgery x2 plus steroid drops 4x a day for 7 weeks. 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  She is recovering from cataract surgery x 2  and had some scar tissue   #hypertension S: medication: Carvedilol 6.25Mg twice daily, amlodipine 10 mg Home readings #s: Average over last 14 blood pressures 126/70 BP Readings from Last 3 Encounters:  01/07/20 126/80  07/07/19 128/82  04/17/19 120/70  A/P: Stable. Continue current medications.    Recommended follow up: 68-monthphysical Future Appointments  Date Time [place] 01/27/2020  9:30 AM [facility] CCM PHARMACIST [facility]   Lab/Order associations:   ICD-10-CM   1. Hypertension associated with diabetes ([place]  E11.59    I15.2   2. Type 2 diabetes mellitus with peripheral vascular disease (HCC)  E11.51 Hemoglobin A1c  3. Hyperlipidemia associated with type 2 diabetes mellitus (HCC)  E11.69 LDL cholesterol, direct   E78.5   4. Type 2 diabetes mellitus with mild nonproliferative retinopathy  without macular edema, without long-term current use of insulin, unspecified laterality ([facility]  E11.3299   5. Mild nonproliferative diabetic retinopathy of both eyes without macular edema associated with type 2 diabetes mellitus ([facility]  EY85.0277   Return precautions advised.  SGarret Reddish MD

## 2020-01-07 ENCOUNTER — Encounter: Payer: Self-pay | Admitting: Family Medicine

## 2020-01-07 ENCOUNTER — Other Ambulatory Visit: Payer: Self-pay

## 2020-01-07 ENCOUNTER — Ambulatory Visit (INDEPENDENT_AMBULATORY_CARE_PROVIDER_SITE_OTHER): Payer: PPO | Admitting: Family Medicine

## 2020-01-07 VITALS — BP 126/80 | HR 81 | Temp 98.3°F | Resp 18 | Ht 63.0 in | Wt 176.6 lb

## 2020-01-07 DIAGNOSIS — I152 Hypertension secondary to endocrine disorders: Secondary | ICD-10-CM | POA: Diagnosis not present

## 2020-01-07 DIAGNOSIS — E1169 Type 2 diabetes mellitus with other specified complication: Secondary | ICD-10-CM

## 2020-01-07 DIAGNOSIS — E1159 Type 2 diabetes mellitus with other circulatory complications: Secondary | ICD-10-CM

## 2020-01-07 DIAGNOSIS — E113299 Type 2 diabetes mellitus with mild nonproliferative diabetic retinopathy without macular edema, unspecified eye: Secondary | ICD-10-CM

## 2020-01-07 DIAGNOSIS — E113293 Type 2 diabetes mellitus with mild nonproliferative diabetic retinopathy without macular edema, bilateral: Secondary | ICD-10-CM

## 2020-01-07 DIAGNOSIS — E785 Hyperlipidemia, unspecified: Secondary | ICD-10-CM

## 2020-01-07 DIAGNOSIS — E1151 Type 2 diabetes mellitus with diabetic peripheral angiopathy without gangrene: Secondary | ICD-10-CM | POA: Diagnosis not present

## 2020-01-08 LAB — HEMOGLOBIN A1C
Hgb A1c MFr Bld: 7 % of total Hgb — ABNORMAL HIGH (ref <5.7)
Mean Plasma Glucose: 154 (calc)
eAG (mmol/L): 8.5 (calc)

## 2020-01-08 LAB — LDL CHOLESTEROL, DIRECT: Direct LDL: 72 mg/dL (ref <100)

## 2020-01-12 ENCOUNTER — Other Ambulatory Visit: Payer: Self-pay

## 2020-01-12 MED ORDER — METFORMIN HCL 1000 MG PO TABS: ORAL_TABLET | ORAL | 3 refills | Status: DC

## 2020-01-27 ENCOUNTER — Ambulatory Visit: Payer: PPO

## 2020-01-27 DIAGNOSIS — E1159 Type 2 diabetes mellitus with other circulatory complications: Secondary | ICD-10-CM

## 2020-01-27 DIAGNOSIS — E1151 Type 2 diabetes mellitus with diabetic peripheral angiopathy without gangrene: Secondary | ICD-10-CM

## 2020-01-27 DIAGNOSIS — E785 Hyperlipidemia, unspecified: Secondary | ICD-10-CM

## 2020-01-27 DIAGNOSIS — I152 Hypertension secondary to endocrine disorders: Secondary | ICD-10-CM

## 2020-01-27 DIAGNOSIS — E1169 Type 2 diabetes mellitus with other specified complication: Secondary | ICD-10-CM

## 2020-01-27 NOTE — Patient Instructions (Addendum)
Alexandra Fletcher,  It was nice meeting you over the phone! Thank you for taking the time to review your medications with me. If you find out that you may meet the patient assistance criteria for Januvia or the newer medication Rybelsus please let me know.   Please review care plan below and call me at [phone] with any questions!  Thanks again, Ellin Mayhew, Pharm.D., BCGP Clinical Pharmacist [place] [phone]  Goals Addressed            This Visit's Progress   . PharmD Care Plan       CARE PLAN ENTRY (see longitudinal plan of care for additional care plan information)  Current Barriers:  . Chronic Disease Management support, education, and care coordination needs related to Hypertension, Hyperlipidemia, and Diabetes  Hypertension BP Readings from Last 3 Encounters:  01/07/20 126/80  07/07/19 128/82  04/17/19 120/70   . Pharmacist Clinical Goal(s): o Over the next 180 days, patient will work with PharmD and providers to maintain BP goal <130/80 . Current regimen:  o Amlodipine 10 mg once daily o Carvedilol 6.25 mg twice daily  . Interventions: o We discussed diet and exercise extensively - routine exercise through water walking, walking. Minimal intake of processed foods, white meats such as fish over red meats. Minimimal snacking or sugary foods/drinks.    . Patient self care activities - Over the next 180 days, patient will: o Check BP at least once every 1-2 weeks, document, and provide at future appointments o Ensure daily salt intake < 2300 mg/day  Hyperlipidemia Lab Results  Component Value Date/Time   LDLCALC 89 07/07/2019 10:03 AM   LDLDIRECT 72 01/07/2020 08:44 AM   . Pharmacist Clinical Goal(s): o Over the next 180 days, patient will work with PharmD and providers to achieve LDL goal < 70 . Current regimen:  o Atorvastatin 20 mg twice weekly . Interventions: o Medication counseling  o Diet and exercise  recommendations . Patient self care activities - Over the next 180 days, patient will: o Maintain routine physical activity including swimming  Diabetes Lab Results  Component Value Date/Time   HGBA1C 7.0 (H) 01/07/2020 08:44 AM   HGBA1C 7.0 (H) 07/07/2019 10:03 AM   . Pharmacist Clinical Goal(s): o Over the next 180 days, patient will work with PharmD and providers to achieve A1c goal <7% . Current regimen:  o Metformin 1000 mg tablet - 1 tablet with breakfast half tablet (500 mg) with lunch and 1 tab with dinner. o Januvia 100 mg once daily o Glipizide 10 mg XL once daily . Interventions: o Diet and exercise recommendations o Review of hypoglycemia and corrections strategies o We discussed: diet and exercise extensively. Reviewed rule of 15 for blood sugar correction. Discussed patient assistance programs for diabetes medications including Januvia and if wanting to consider switching to newer agent, such as Rybelsus, would be dealing with similar income criteria for patient assistance. . Patient self care activities - Over the next 180 days, patient will: o Check blood sugar once daily, document, and provide at future appointments o Contact provider with any episodes of hypoglycemia Medication management . Pharmacist Clinical Goal(s): o Over the next 180 days, patient will work with PharmD and providers to maintain optimal medication adherence . Current pharmacy: [facility]  . Interventions o Comprehensive medication review performed. o Continue current medication management strategy . Patient self care activities - Over the next 180 days, patient will: o Take medications as  prescribed o Report any questions or concerns to PharmD and/or provider(s) Initial goal documentation.      The patient verbalized understanding of instructions provided today and agreed to receive a mailed copy of patient instruction and/or educational materials. Telephone follow up appointment  with pharmacy team member scheduled for: See next appointment with "Care Management Staff" under "What's Next" below.   [name], Pharm.D., BCGP Clinical Pharmacist [facility] [phone]  Diabetes Mellitus and Nutrition, Adult When you have diabetes (diabetes mellitus), it is very important to have healthy eating habits because your blood sugar (glucose) levels are greatly affected by what you eat and drink. Eating healthy foods in the appropriate amounts, at about the same times every day, can help you:  Control your blood glucose.  Lower your risk of heart disease.  Improve your blood pressure.  Reach or maintain a healthy weight. Every person with diabetes is different, and each person has different needs for a meal plan. Your health care provider may recommend that you work with a diet and nutrition specialist (dietitian) to make a meal plan that is best for you. Your meal plan may vary depending on factors such as:  The calories you need.  The medicines you take.  Your weight.  Your blood glucose, blood pressure, and cholesterol levels.  Your activity level.  Other health conditions you have, such as heart or kidney disease. How do carbohydrates affect me? Carbohydrates, also called carbs, affect your blood glucose level more than any other type of food. Eating carbs naturally raises the amount of glucose in your blood. Carb counting is a method for keeping track of how many carbs you eat. Counting carbs is important to keep your blood glucose at a healthy level, especially if you use insulin or take certain oral diabetes medicines. It is important to know how many carbs you can safely have in each meal. This is different for every person. Your dietitian can help you calculate how many carbs you should have at each meal and for each snack. Foods that contain carbs include:  Bread, cereal, rice, pasta, and crackers.  Potatoes and corn.  Peas, beans, and  lentils.  Milk and yogurt.  Fruit and juice.  Desserts, such as cakes, cookies, ice cream, and candy. How does alcohol affect me? Alcohol can cause a sudden decrease in blood glucose (hypoglycemia), especially if you use insulin or take certain oral diabetes medicines. Hypoglycemia can be a life-threatening condition. Symptoms of hypoglycemia (sleepiness, dizziness, and confusion) are similar to symptoms of having too much alcohol. If your health care provider says that alcohol is safe for you, follow these guidelines:  Limit alcohol intake to no more than 1 drink per day for nonpregnant women and 2 drinks per day for men. One drink equals 12 oz of beer, 5 oz of wine, or 1 oz of hard liquor.  Do not drink on an empty stomach.  Keep yourself hydrated with water, diet soda, or unsweetened iced tea.  Keep in mind that regular soda, juice, and other mixers may contain a lot of sugar and must be counted as carbs. What are tips for following this plan?  Reading food labels  Start by checking the serving size on the "Nutrition Facts" label of packaged foods and drinks. The amount of calories, carbs, fats, and other nutrients listed on the label is based on one serving of the item. Many items contain more than one serving per package.  Check the total grams (  g) of carbs in one serving. You can calculate the number of servings of carbs in one serving by dividing the total carbs by 15. For example, if a food has 30 g of total carbs, it would be equal to 2 servings of carbs.  Check the number of grams (g) of saturated and trans fats in one serving. Choose foods that have low or no amount of these fats.  Check the number of milligrams (mg) of salt (sodium) in one serving. Most people should limit total sodium intake to less than 2,300 mg per day.  Always check the nutrition information of foods labeled as "low-fat" or "nonfat". These foods may be higher in added sugar or refined carbs and should  be avoided.  Talk to your dietitian to identify your daily goals for nutrients listed on the label. Shopping  Avoid buying canned, premade, or processed foods. These foods tend to be high in fat, sodium, and added sugar.  Shop around the outside edge of the grocery store. This includes fresh fruits and vegetables, bulk grains, fresh meats, and fresh dairy. Cooking  Use low-heat cooking methods, such as baking, instead of high-heat cooking methods like deep frying.  Cook using healthy oils, such as olive, canola, or sunflower oil.  Avoid cooking with butter, cream, or high-fat meats. Meal planning  Eat meals and snacks regularly, preferably at the same times every day. Avoid going long periods of time without eating.  Eat foods high in fiber, such as fresh fruits, vegetables, beans, and whole grains. Talk to your dietitian about how many servings of carbs you can eat at each meal.  Eat 4-6 ounces (oz) of lean protein each day, such as lean meat, chicken, fish, eggs, or tofu. One oz of lean protein is equal to: ? 1 oz of meat, chicken, or fish. ? 1 egg. ?  cup of tofu.  Eat some foods each day that contain healthy fats, such as avocado, nuts, seeds, and fish. Lifestyle  Check your blood glucose regularly.  Exercise regularly as told by your health care provider. This may include: ? 150 minutes of moderate-intensity or vigorous-intensity exercise each week. This could be brisk walking, biking, or water aerobics. ? Stretching and doing strength exercises, such as yoga or weightlifting, at least 2 times a week.  Take medicines as told by your health care provider.  Do not use any products that contain nicotine or tobacco, such as cigarettes and e-cigarettes. If you need help quitting, ask your health care provider.  Work with a Social worker or diabetes educator to identify strategies to manage stress and any emotional and social challenges. Questions to ask a health care  provider  Do I need to meet with a diabetes educator?  Do I need to meet with a dietitian?  What number can I call if I have questions?  When are the best times to check my blood glucose? Where to find more information:  American Diabetes Association: diabetes.org  Academy of Nutrition and Dietetics: www.eatright.[employer] of Diabetes and Digestive and Kidney Diseases (NIH): [employer] Summary  A healthy meal plan will help you control your blood glucose and maintain a healthy lifestyle.  Working with a diet and nutrition specialist (dietitian) can help you make a meal plan that is best for you.  Keep in mind that carbohydrates (carbs) and alcohol have immediate effects on your blood glucose levels. It is important to count carbs and to use alcohol carefully. This information is not intended  to replace advice given to you by your health care provider. Make sure you discuss any questions you have with your health care provider. Document Revised: 02/15/2017 Document Reviewed: 04/09/2016 Elsevier Patient Education  2020 [employer].

## 2020-01-27 NOTE — Progress Notes (Signed)
Chronic Care Management Pharmacy  Name: Alexandra Fletcher  MRN: [id number]   DOB: 11/11/1950  Chief Complaint/ HPI Alexandra Fletcher, 69 y.o., female, presents for their initial CCM visit with the clinical pharmacist via telephone due to COVID-19 pandemic .  PCP : Marin Olp, MD Encounter Diagnoses  Name Primary?  . Type 2 diabetes mellitus with peripheral vascular disease ([place]) Yes  . Hyperlipidemia associated with type 2 diabetes mellitus ([place])   . Hypertension associated with diabetes ([place])      Office Visits:  01/07/2020 (PCP):  hx of CVA, DM retinopathy. Physical 06/2020. Metformin increased from 2g/day to 2.5 g/day.  Patient Active Problem List   Diagnosis Date Noted  . Type 2 diabetes mellitus with peripheral vascular disease ([place]) 07/07/2019  . Posterior tibial tendinitis of right lower extremity 04/01/2018  . Dysphagia, pharyngoesophageal phase 02/21/2015  . Diabetic retinopathy ([place]) 12/19/2014  . History of CVA - Acute right arterial ischemic stroke, PCA (posterior cerebral artery) 01/04/2013  . Eosinophilia 12/03/2011  . Sinus tarsi syndrome 01/31/2011  . Baker's cyst of knee 10/10/2010  . Hyperlipidemia associated with type 2 diabetes mellitus ([place]) 08/16/2009  . Hypertension associated with diabetes ([place]) 08/16/2009  . History of colonic polyps 08/16/2009  . HEART MURMUR, BENIGN 05/16/2007  . History of abnormal cervical Pap smear 04/21/2007  . Type 2 diabetes mellitus with ophthalmic complication ([place]) 74/25/9563   Past Surgical History:  Procedure Laterality Date  . CATARACT EXTRACTION, BILATERAL    . COLONOSCOPY  2008   neg  . DILATION AND CURETTAGE OF UTERUS     X2  . KNEE ARTHROSCOPY W/ MENISCAL REPAIR  2008    Dr Adella Nissen knee  . Ovarian cysts removed      Dr Warnell Forester  . POLYPECTOMY  2003   polypoid colonic mucosa/ 2008 no polyps  . THUMB SURGERY  1983   Left; post skiing accident in [place]   Family History  Problem  Relation Age of Onset  . Liver disease Mother        ? malignancy after Hepatitis  . Diabetes Father   . Heart attack Father        CBAG @ 72  . Multiple myeloma Father   . Hyperlipidemia Father   . Hypertension Father   . Diabetes Brother        Type 1 DM  . Heart attack Brother 45  . Stroke Maternal Aunt        > 65  . Breast cancer Cousin   . Other Sister        died as child from pneumonia  . Healthy Brother   . Colon cancer Neg Hx   . Colon polyps Neg Hx   . Esophageal cancer Neg Hx   . Rectal cancer Neg Hx   . Stomach cancer Neg Hx    Allergies  Allergen Reactions  . Lisinopril     Swelling of tongue  . Moxifloxacin     Achilles tendon rupture potentially  . Pioglitazone     REACTION: weight gain & ankle edema   Outpatient Encounter Medications as of 01/27/2020  Medication Sig  . amLODipine (NORVASC) 10 MG tablet Take 1 tablet (10 mg total) by mouth daily.  Marland Kitchen aspirin EC 81 MG tablet Take 81 mg by mouth daily.  Marland Kitchen atorvastatin (LIPITOR) 20 MG tablet 1 tab twice a week.  . Blood Glucose Monitoring Suppl (ONE TOUCH ULTRA 2) w/Device KIT USE TO TEST BLOOD SUGAR TWO TO THREE  TIMES DAILY  . carvedilol (COREG) 6.25 MG tablet TAKE ONE TABLET BY MOUTH TWICE DAILY  . glipiZIDE (GLUCOTROL XL) 10 MG 24 hr tablet TAKE ONE (1) TABLET BY MOUTH EVERY DAY  . JANUVIA 100 MG tablet TAKE 1 TABLET (100 MG TOTAL) BY MOUTH DAILY.  . metFORMIN (GLUCOPHAGE) 1000 MG tablet Take a full tab t in the morning, 1/2 tab at lunch and a full tab at dinner.  Glory Rosebush Delica Lancets 27C MISC USE TO TEST BLOOD SUGAR TWO TO THREE TIMES DAILY  . ONETOUCH ULTRA test strip USE TO TEST BLOOD GLUCOSE 2 TO 3 TIMES A DAY  . vitamin B-12 (CYANOCOBALAMIN) 100 MCG tablet    No facility-administered encounter medications on file as of 01/27/2020.   Patient Care Team    Relationship Specialty Notifications Start End  Marin Olp, MD PCP - General Family Medicine  01/24/17   Gerda Diss, DO  Consulting Physician Sports Medicine  03/17/18   [name], [facility] Pharmacist Pharmacist  11/06/19    Comment: [phone]   Current Diagnosis/Assessment: Goals Addressed            This Visit's Progress   . PharmD Care Plan       CARE PLAN ENTRY (see longitudinal plan of care for additional care plan information)  Current Barriers:  . Chronic Disease Management support, education, and care coordination needs related to Hypertension, Hyperlipidemia, and Diabetes  Hypertension BP Readings from Last 3 Encounters:  01/07/20 126/80  07/07/19 128/82  04/17/19 120/70   . Pharmacist Clinical Goal(s): o Over the next 180 days, patient will work with PharmD and providers to maintain BP goal <130/80 . Current regimen:  o Amlodipine 10 mg once daily o Carvedilol 6.25 mg twice daily  . Interventions: o We discussed diet and exercise extensively - routine exercise through water walking, walking. Minimal intake of processed foods, white meats such as fish over red meats. Minimimal snacking or sugary foods/drinks.    . Patient self care activities - Over the next 180 days, patient will: o Check BP at least once every 1-2 weeks, document, and provide at future appointments o Ensure daily salt intake < 2300 mg/day  Hyperlipidemia Lab Results  Component Value Date/Time   LDLCALC 89 07/07/2019 10:03 AM   LDLDIRECT 72 01/07/2020 08:44 AM   . Pharmacist Clinical Goal(s): o Over the next 180 days, patient will work with PharmD and providers to achieve LDL goal < 70 . Current regimen:  o Atorvastatin 20 mg twice weekly . Interventions: o Medication counseling  o Diet and exercise recommendations . Patient self care activities - Over the next 180 days, patient will: o Maintain routine physical activity including swimming  Diabetes Lab Results  Component Value Date/Time   HGBA1C 7.0 (H) 01/07/2020 08:44 AM   HGBA1C 7.0 (H) 07/07/2019 10:03 AM   . Pharmacist Clinical Goal(s): o Over  the next 180 days, patient will work with PharmD and providers to achieve A1c goal <7% . Current regimen:  o Metformin 1000 mg tablet - 1 tablet with breakfast half tablet (500 mg) with lunch and 1 tab with dinner. o Januvia 100 mg once daily o Glipizide 10 mg XL once daily . Interventions: o Diet and exercise recommendations o Review of hypoglycemia and corrections strategies o We discussed: diet and exercise extensively. Reviewed rule of 15 for blood sugar correction. Discussed patient assistance programs for diabetes medications including Januvia and if wanting to consider switching to newer agent, such as  Rybelsus, would be dealing with similar income criteria for patient assistance. . Patient self care activities - Over the next 180 days, patient will: o Check blood sugar once daily, document, and provide at future appointments o Contact provider with any episodes of hypoglycemia Medication management . Pharmacist Clinical Goal(s): o Over the next 180 days, patient will work with PharmD and providers to maintain optimal medication adherence . Current pharmacy: [facility]  . Interventions o Comprehensive medication review performed. o Continue current medication management strategy . Patient self care activities - Over the next 180 days, patient will: o Take medications as prescribed o Report any questions or concerns to PharmD and/or provider(s) Initial goal documentation.      Hypertension   BP goal <130/80  BP Readings from Last 3 Encounters:  01/07/20 126/80  07/07/19 128/82  04/17/19 120/70   Previous medications: lisinopril (possible tongue swelling). Patient checks BP at home routinely.  Recent home readings: 120s/70-80. Denies dizziness.  Patient is currently at goal on the following medications:  . Carvedilol 6.25 mg twice daily . Amlodipine 10 mg once daily  We discussed diet and exercise extensively - routine exercise through water aerobics, walking.  Minimal intake of processed foods, white meats such as fish over red meats. Minimimal snacking or sugary foods/drinks.     Plan  Continue current medications.  Diabetes   A1c goal < 7% Lab Results  Component Value Date   VITAMINB12 1,483 (H) 07/07/2019   Lab Results  Component Value Date/Time   HGBA1C 7.0 (H) 01/07/2020 08:44 AM   HGBA1C 7.0 (H) 07/07/2019 10:03 AM   MICROALBUR 3.5 (H) 07/07/2019 10:03 AM   MICROALBUR 1.4 07/21/2018 08:34 AM    Checking BG: Daily. Recent FBG readings: ~140s. Denies frequent lows, has glucose tablets on hand. Previous medications: glimepiride, Tradjenta.  Takes 100 mcg b12 once daily. Metformin inc from 2 g to 2.5 g/day 12/2019. Concern with urinary side effects of SGLT2i.  Patient is currently near goal on the following medications:   Metformin 1000 mg to take full tablet in the morning, half tablet at lunch, full tablet with dinner  Januvia 100 mg once daily  Glipizide 10 mg XL once daily   We discussed: diet and exercise extensively. Reviewed rule of 15 for blood sugar correction. Discussed patient assistance programs for diabetes medications including Januvia and if wanting to consider switching to newer agent, such as Rybelsus, would be dealing with similar income criteria for patient assistance.  Plan  Continue current medications. Pt to confirm income criteria and let RPH know wanting to move forward w/ above PAP.  Hyperlipidemia   LDL goal < 70  Lipid Panel     Component Value Date/Time   CHOL 179 07/07/2019 1003   TRIG 73.0 07/07/2019 1003   HDL 75.40 07/07/2019 1003   LDLCALC 89 07/07/2019 1003   LDLDIRECT 72 01/07/2020 0844    Hepatic Function Latest Ref Rng & Units 07/07/2019 07/21/2018 12/10/2017  Total Protein 6.0 - 8.3 g/dL 7.2 6.6 7.9  Albumin 3.5 - 5.2 g/dL 4.7 4.4 4.7  AST 0 - 37 U/L '18 14 16  ' ALT 0 - 35 U/L '25 20 19  ' Alk Phosphatase 39 - 117 U/L 66 65 64  Total Bilirubin 0.2 - 1.2 mg/dL 0.6 0.5 0.5  Bilirubin,  Direct 0.0 - 0.3 mg/dL - - -    The 10-year ASCVD risk score Mikey Bussing DC Jr., et al., 2013) is: 20.5%   Values used to calculate the score:  Age: 49 years     Sex: Female     Is Non-Hispanic African American: No     Diabetic: Yes     Tobacco smoker: No     Systolic Blood Pressure: 709 mmHg     Is BP treated: Yes     HDL Cholesterol: 75.4 mg/dL     Total Cholesterol: 179 mg/dL   Previously on atorvastatin 10 mg twice weekly. Patient is currently near goal the following medications:  . Atorvastatin 20 mg twice WEEKLY (increased 06/2019)  CVA Hx on asa. Denies any abnormal bruising, bleeding from nose or gums or blood in urine or stool.  Patient is currently controlled on the following medications:  . Aspirin 81 mg once daily  We discussed diet and exercise extensively.   Plan  Continue current medications. Consider increase to higher intensity statin, atorvastatin 40 mg at f/u.  Osteopenia   Last DEXA Scan: 11/2019 - normal. Vit D, 25-Hydroxy  Date Value Ref Range Status  08/16/2009 56 30 - 89 ng/mL Final    Comment:    See lab report for associated comment(s)    Previous medications: n/a. Patient is not a candidate for pharmacologic treatment. Diet seems to be rich in calcium and reasonable vitamin d3 intake and outdoor activity.  Current medications: . N/a.  Plan  Recommend [phone] units of vitamin D daily.  Recommend 1200 mg of calcium daily from dietary and supplemental sources.  Vaccines   Immunization History  Administered Date(s) Administered  . Fluad Quad(high Dose 65+) 12/15/2019  . Influenza Split 02/22/2011, 12/03/2011  . Influenza Whole 01/15/2007, 12/17/2007, 03/03/2008, 12/07/2009  . Influenza, High Dose Seasonal PF 01/13/2016, 12/27/2016, 12/10/2017  . Influenza,inj,Quad PF,6+ Mos 01/20/2013  . Influenza-Unspecified 12/30/2014, 11/06/2018  . PFIZER SARS-COV-2 Vaccination 04/04/2019, 04/22/2019, 12/15/2019  . Pneumococcal Conjugate-13 02/21/2015    . Pneumococcal Polysaccharide-23 01/24/2017  . Td 05/21/2002  . Tdap 02/11/2013  . Zoster 01/05/2011  . Zoster Recombinat (Shingrix) 02/12/2017, 06/03/2017   Reviewed and discussed patient's vaccination history.    Plan  No recommendations at this time.   Medication Management / Care Coordination   Receives prescription medications from:  [facility] at [facility], [place] [address] Phone: [phone] Fax: [phone]  [place], [place] - [address] [place] [address] Phone: [phone] Fax: [phone]   Denies any issues with current medication management.   Plan  Continue current medication management strategy. ___________________________ SDOH (Social Determinants of Health) assessments performed: Yes.  Future Appointments  Date Time Provider [place]  07/11/2020  8:00 AM Marin Olp, MD [facility]  08/01/2020  1:00 PM [facility] CCM PHARMACIST [facility]   Visit follow-up:  . CPA follow-up: March 2022 DM call. Marland Kitchen RPH follow-up: 6 month telephone visit.  [name], Pharm.D., BCGP Clinical Pharmacist [facility] [phone]

## 2020-02-03 ENCOUNTER — Telehealth: Payer: Self-pay

## 2020-02-03 NOTE — Progress Notes (Signed)
Please void note, open in error.  

## 2020-02-08 DIAGNOSIS — C44319 Basal cell carcinoma of skin of other parts of face: Secondary | ICD-10-CM | POA: Diagnosis not present

## 2020-02-08 DIAGNOSIS — Z85828 Personal history of other malignant neoplasm of skin: Secondary | ICD-10-CM | POA: Diagnosis not present

## 2020-02-22 ENCOUNTER — Other Ambulatory Visit: Payer: Self-pay | Admitting: Family Medicine

## 2020-02-24 DIAGNOSIS — H26492 Other secondary cataract, left eye: Secondary | ICD-10-CM | POA: Diagnosis not present

## 2020-02-24 DIAGNOSIS — Z961 Presence of intraocular lens: Secondary | ICD-10-CM | POA: Diagnosis not present

## 2020-02-24 DIAGNOSIS — E113293 Type 2 diabetes mellitus with mild nonproliferative diabetic retinopathy without macular edema, bilateral: Secondary | ICD-10-CM | POA: Diagnosis not present

## 2020-02-24 DIAGNOSIS — Z7984 Long term (current) use of oral hypoglycemic drugs: Secondary | ICD-10-CM | POA: Diagnosis not present

## 2020-03-17 ENCOUNTER — Other Ambulatory Visit: Payer: Self-pay | Admitting: Family Medicine

## 2020-03-31 ENCOUNTER — Other Ambulatory Visit: Payer: Self-pay | Admitting: Family Medicine

## 2020-04-19 ENCOUNTER — Telehealth: Payer: Self-pay

## 2020-04-19 NOTE — Chronic Care Management (AMB) (Signed)
Chronic Care Management Pharmacy Assistant   Name: Alexandra Fletcher  MRN: [id number] DOB: 03/01/1951  Reason for Encounter: Disease State / Diabetes Adherence Call  Patient Questions:  1.  Have you seen any other providers since your last visit?  Yes, 02/08/2020 Elder Negus, MD, Basal Cell Carcinoma  2.  Any changes in your medicines or health? No  PCP : Marin Olp, MD  Allergies:   Allergies  Allergen Reactions  . Lisinopril     Swelling of tongue  . Moxifloxacin     Achilles tendon rupture potentially  . Pioglitazone     REACTION: weight gain & ankle edema    Medications: Outpatient Encounter Medications as of 04/19/2020  Medication Sig  . amLODipine (NORVASC) 10 MG tablet Take 1 tablet (10 mg total) by mouth daily.  Marland Kitchen aspirin EC 81 MG tablet Take 81 mg by mouth daily.  Marland Kitchen atorvastatin (LIPITOR) 10 MG tablet TAKE 1 TABLET (10 MG TOTAL) BY MOUTH 2 (TWO) TIMES A WEEK.  Marland Kitchen atorvastatin (LIPITOR) 20 MG tablet 1 tab twice a week.  . Blood Glucose Monitoring Suppl (ONE TOUCH ULTRA 2) w/Device KIT USE TO TEST BLOOD SUGAR TWO TO THREE TIMES DAILY  . carvedilol (COREG) 6.25 MG tablet TAKE ONE TABLET BY MOUTH TWICE DAILY  . glipiZIDE (GLUCOTROL XL) 10 MG 24 hr tablet TAKE ONE (1) TABLET BY MOUTH EVERY DAY  . JANUVIA 100 MG tablet TAKE 1 TABLET (100 MG TOTAL) BY MOUTH DAILY.  . metFORMIN (GLUCOPHAGE) 1000 MG tablet Take a full tab t in the morning, 1/2 tab at lunch and a full tab at dinner.  Glory Rosebush Delica Lancets 18H MISC USE TO TEST BLOOD SUGAR TWO TO THREE TIMES DAILY  . ONETOUCH ULTRA test strip USE TO TEST BLOOD GLUCOSE 2 TO 3 TIMES A DAY  . vitamin B-12 (CYANOCOBALAMIN) 100 MCG tablet    No facility-administered encounter medications on file as of 04/19/2020.    Current Diagnosis: Patient Active Problem List   Diagnosis Date Noted  . Type 2 diabetes mellitus with peripheral vascular disease ([place]) 07/07/2019  . Posterior tibial tendinitis of  right lower extremity 04/01/2018  . Dysphagia, pharyngoesophageal phase 02/21/2015  . Diabetic retinopathy ([place]) 12/19/2014  . History of CVA - Acute right arterial ischemic stroke, PCA (posterior cerebral artery) 01/04/2013  . Eosinophilia 12/03/2011  . Sinus tarsi syndrome 01/31/2011  . Baker's cyst of knee 10/10/2010  . Hyperlipidemia associated with type 2 diabetes mellitus ([place]) 08/16/2009  . Hypertension associated with diabetes ([place]) 08/16/2009  . History of colonic polyps 08/16/2009  . HEART MURMUR, BENIGN 05/16/2007  . History of abnormal cervical Pap smear 04/21/2007  . Type 2 diabetes mellitus with ophthalmic complication ([place]) 63/14/9702    Recent Relevant Labs: Lab Results  Component Value Date/Time   HGBA1C 7.0 (H) 01/07/2020 08:44 AM   HGBA1C 7.0 (H) 07/07/2019 10:03 AM   MICROALBUR 3.5 (H) 07/07/2019 10:03 AM   MICROALBUR 1.4 07/21/2018 08:34 AM    Kidney Function Lab Results  Component Value Date/Time   CREATININE 0.81 07/07/2019 10:03 AM   CREATININE 0.77 07/21/2018 08:34 AM   GFR 70.00 07/07/2019 10:03 AM   GFRNONAA 69 05/16/2007 08:11 AM   GFRAA 83 05/16/2007 08:11 AM    . Current antihyperglycemic regimen:  o Glipizide 10 mg daily o Januvia 100 mg daily o Metformin 1000 mg full tab in the morning, 1/2 tab at lunch and a full tab at dinner.  . What recent  interventions/DTPs have been made to improve glycemic control:  o Patient states she has been taking her medications as prescribed.  . Have there been any recent hospitalizations or ED visits since last visit with CPP? No , patient has not had any recent hospitalizations since her last visit with [name], CPP.  Marland Kitchen Patient denies hypoglycemic symptoms, including Pale, Sweaty, Shaky, Hungry, Nervous/irritable and Vision changes   . Patient denies hyperglycemic symptoms, including blurry vision, excessive thirst, fatigue, polyuria and weakness   . How often are you checking your blood sugar? once  daily   . What are your blood sugars ranging?  o Fasting: 89-130 o Before meals: n/a o After meals: n/a o Bedtime: n/a  . During the week, how often does your blood glucose drop below 70? Never   . Are you checking your feet daily/regularly? Yes, patient states she does check her feet regularly. Patient states she has noticed some ankle swelling at night that seems to resolve by morning. Patient states she will occasionally drink a glass of wine before bed. Patient would like to know if this will cause her ankles to swell or not?  Adherence Review: Is the patient currently on a STATIN medication? Yes Is the patient currently on ACE/ARB medication? No Does the patient have >5 day gap between last estimated fill dates? 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 Patient also states that she does get out and walk when the weather permits. Patient states she will walk about 1.5 miles each time ~3 times a week.  April D Calhoun, [place] Pharmacist Assistant [phone]   Follow-Up:  Pharmacist Review

## 2020-04-21 ENCOUNTER — Telehealth: Payer: Self-pay

## 2020-04-21 NOTE — Telephone Encounter (Cosign Needed)
I called and spoke with the patient, she is aware and verbally understands. AC/CMA

## 2020-05-16 ENCOUNTER — Telehealth: Payer: Self-pay | Admitting: Family Medicine

## 2020-05-16 NOTE — Telephone Encounter (Signed)
Left message for patient to call back and schedule Medicare Annual Wellness Visit (AWV) either virtually OR in office.   Last AWV 04/17/19; please schedule at anytime with [facility] Ad[facility] at [facility].  This should be a 45 minute visit.

## 2020-05-30 ENCOUNTER — Other Ambulatory Visit: Payer: Self-pay | Admitting: Family Medicine

## 2020-05-31 ENCOUNTER — Telehealth: Payer: Self-pay

## 2020-05-31 ENCOUNTER — Other Ambulatory Visit: Payer: Self-pay | Admitting: Family Medicine

## 2020-05-31 MED ORDER — AMLODIPINE BESYLATE 10 MG PO TABS
10.0000 mg | ORAL_TABLET | Freq: Every day | ORAL | 3 refills | Status: DC
Start: 2020-05-31 — End: 2020-05-31

## 2020-05-31 NOTE — Telephone Encounter (Signed)
Medication has been sent to the pharmacy. And they did confirm that they received the script.

## 2020-05-31 NOTE — Telephone Encounter (Signed)
Pharmacy stats they never received a requested for refill on amLODipine (NORVASC) 10 MG tablet Can we please resend to   [facility] at [facility], [place]

## 2020-06-09 ENCOUNTER — Other Ambulatory Visit: Payer: Self-pay | Admitting: Family Medicine

## 2020-06-30 MED FILL — Glucose Blood Test Strip: 30 days supply | Qty: 100 | Fill #0 | Status: AC

## 2020-07-01 ENCOUNTER — Other Ambulatory Visit (HOSPITAL_COMMUNITY): Payer: Self-pay

## 2020-07-01 ENCOUNTER — Other Ambulatory Visit: Payer: Self-pay | Admitting: Family Medicine

## 2020-07-01 MED FILL — Sitagliptin Phosphate Tab 100 MG (Base Equiv): ORAL | 30 days supply | Qty: 30 | Fill #0 | Status: CN

## 2020-07-04 ENCOUNTER — Other Ambulatory Visit (HOSPITAL_COMMUNITY): Payer: Self-pay

## 2020-07-04 MED ORDER — CARVEDILOL 6.25 MG PO TABS
ORAL_TABLET | Freq: Two times a day (BID) | ORAL | 0 refills | Status: DC
  Filled 2020-07-04: qty 180, 90d supply, fill #0

## 2020-07-04 MED FILL — Sitagliptin Phosphate Tab 100 MG (Base Equiv): ORAL | 30 days supply | Qty: 30 | Fill #0 | Status: AC

## 2020-07-07 NOTE — Progress Notes (Signed)
Phone [phone] In person visit   Subjective:   Alexandra Fletcher is a 70 y.o. year old very pleasant female patient who presents for/with See problem oriented charting Chief Complaint  Patient presents with  . Hyperlipidemia  . Hypertension  . Diabetes   This visit occurred during the SARS-CoV-2 public health emergency.  Safety protocols were in place, including screening questions prior to the visit, additional usage of staff PPE, and extensive cleaning of exam room while observing appropriate contact time as indicated for disinfecting solutions.   Past Medical History-  Patient Active Problem List   Diagnosis Date Noted  . History of CVA - Acute right arterial ischemic stroke, PCA (posterior cerebral artery) 01/04/2013    Priority: High  . Type 2 diabetes mellitus with ophthalmic complication ([place]) 09/62/8366    Priority: High  . Diabetic retinopathy ([place]) 12/19/2014    Priority: Medium  . Hyperlipidemia associated with type 2 diabetes mellitus ([place]) 08/16/2009    Priority: Medium  . Hypertension associated with diabetes ([place]) 08/16/2009    Priority: Medium  . Dysphagia, pharyngoesophageal phase 02/21/2015    Priority: Low  . Eosinophilia 12/03/2011    Priority: Low  . Sinus tarsi syndrome 01/31/2011    Priority: Low  . Baker's cyst of knee 10/10/2010    Priority: Low  . History of colonic polyps 08/16/2009    Priority: Low  . HEART MURMUR, BENIGN 05/16/2007    Priority: Low  . History of abnormal cervical Pap smear 04/21/2007    Priority: Low  . Type 2 diabetes mellitus with peripheral vascular disease ([place]) 07/07/2019  . Posterior tibial tendinitis of right lower extremity 04/01/2018    Medications- reviewed and updated Current Outpatient Medications  Medication Sig Dispense Refill  . amLODipine (NORVASC) 10 MG tablet TAKE 1 TABLET (10 MG TOTAL) BY MOUTH DAILY. 90 tablet 3  . aspirin EC 81 MG tablet Take 81 mg by mouth daily.    Marland Kitchen atorvastatin  (LIPITOR) 20 MG tablet 1 tab twice a week. 24 tablet 3  . Blood Glucose Monitoring Suppl (ONE TOUCH ULTRA 2) w/Device KIT USE TO TEST BLOOD SUGAR TWO TO THREE TIMES DAILY 1 kit 0  . carvedilol (COREG) 6.25 MG tablet TAKE 1 TABLET BY MOUTH 2 TIMES DAILY 180 tablet 0  . glipiZIDE (GLUCOTROL XL) 10 MG 24 hr tablet TAKE 1 TABLET BY MOUTH EVERY DAY 90 tablet 2  . glucose blood test strip USE TO TEST BLOOD GLUCOSE 2 TO 3 TIMES A DAY (Patient taking differently: USE TO TEST BLOOD GLUCOSE 2 TO 3 TIMES A DAY) 100 strip 2  . metFORMIN (GLUCOPHAGE) 1000 MG tablet TAKE 1 TABLET BY MOUTH IN THE MORNING, 1/2 TABLET AT LUNCH AND 1 TABLET AT DINNER. 225 tablet 3  . OneTouch Delica Lancets 29U MISC USE TO TEST BLOOD SUGAR TWO TO THREE TIMES DAILY 100 each 1  . sitaGLIPtin (JANUVIA) 100 MG tablet TAKE 1 TABLET BY MOUTH DAILY. 30 tablet 2  . vitamin B-12 (CYANOCOBALAMIN) 100 MCG tablet      No current facility-administered medications for this visit.     Objective:  BP 136/72   Pulse 88   Temp 98.2 F (36.8 C) (Temporal)   Ht '5\' 3"'  (1.6 m)   Wt 177 lb 9.6 oz (80.6 kg)   LMP  (LMP Unknown)   SpO2 98%   BMI 31.46 kg/m  Gen: NAD, resting comfortably CV: RRR no murmurs rubs or gallops Lungs: CTAB no crackles, wheeze, rhonchi Abdomen: soft/nontender/nondistended/normal  bowel sounds. No rebound or guarding.  Ext: trace edema- on amlodipine  Skin: warm, dry    Assessment and Plan   # Diabetes-typically well controlled with A1c 7 or less. Prefers orals S: Medication: Januvia 100Mg daily, Metformin 1000Mg - 1g AM, 569m lunch (misses sometimes), 1 g PM, glipizide 10Mg extended release -Takes B12 with long-term metformin use  CBGs- 7 day average 152, 30 day average 156, fasting average 123, 2 hour post dinner 140. Still with some spikes. No lows Exercise and diet- still doing a great job in the water 2.5 hours 4 days a week, Doing her best with healthy diet A/P: hopefully controlled. We discussed possible  rybelsus if a1c 7.5 or great and stop jTonga otherwise continue current meds  #hypertension S: medication: Amlodipine 10 mg, carvedilol 6.278mtwice daily Home readings #s: 122/69 over last 7 readings A/P: excellent control- continue current meds  #hyperlipidemia S: Medication: atorvastatin 20Mg twice a week Lab Results  Component Value Date   CHOL 179 07/07/2019   HDL 75.40 07/07/2019   LDLCALC 89 07/07/2019   LDLDIRECT 72 01/07/2020   TRIG 73.0 07/07/2019   CHOLHDL 2 07/07/2019  A/P: update full lipid panel today- hopefully well controlled and continue current meds  Recommended follow up: Return in about 6 months (around 01/10/2021) for follow up- or sooner if needed. Future Appointments  Date Time Provider [place]5/16/2022  1:00 PM [facility] CCM PHARMACIST [facility]   Lab/Order associations:   ICD-10-CM   1. Type 2 diabetes mellitus with peripheral vascular disease (HCC)  E11.51   2. Hypertension associated with diabetes ([facility] E11.59    I15.2   3. Hyperlipidemia associated with type 2 diabetes mellitus ([facility] E11.69    E78.5   4. High risk medication use  Z79.899     No orders of the defined types were placed in this encounter.    Return precautions advised.  StGarret ReddishMD

## 2020-07-07 NOTE — Patient Instructions (Addendum)
Health Maintenance Due  Topic Date Due  . OPHTHALMOLOGY EXAM Needs to be scheduled for diabetic eye exam 05/28/2020   Please stop by lab before you go If you have mychart- we will send your results within 3 business days of [place] receiving them.  If you do not have mychart- we will call you about results within 5 business days of [place] receiving them.  *please also note that you will see labs on mychart as soon as they post. I will later go in and write notes on them- will say "notes from Dr. Yong Channel"   You are eligible to schedule your annual wellness visit with our nurse specialist Otila Kluver.  Please consider scheduling this before you leave today  Recommended follow up: Return in about 6 months (around 01/10/2021) for follow up- or sooner if needed.

## 2020-07-11 ENCOUNTER — Encounter: Payer: Self-pay | Admitting: Family Medicine

## 2020-07-11 ENCOUNTER — Other Ambulatory Visit: Payer: Self-pay | Admitting: Family Medicine

## 2020-07-11 ENCOUNTER — Ambulatory Visit (INDEPENDENT_AMBULATORY_CARE_PROVIDER_SITE_OTHER): Payer: Medicare Other | Admitting: Family Medicine

## 2020-07-11 ENCOUNTER — Other Ambulatory Visit: Payer: Self-pay

## 2020-07-11 VITALS — BP 136/72 | HR 88 | Temp 98.2°F | Ht 63.0 in | Wt 177.6 lb

## 2020-07-11 DIAGNOSIS — Z79899 Other long term (current) drug therapy: Secondary | ICD-10-CM

## 2020-07-11 DIAGNOSIS — I152 Hypertension secondary to endocrine disorders: Secondary | ICD-10-CM

## 2020-07-11 DIAGNOSIS — E1159 Type 2 diabetes mellitus with other circulatory complications: Secondary | ICD-10-CM

## 2020-07-11 DIAGNOSIS — E1169 Type 2 diabetes mellitus with other specified complication: Secondary | ICD-10-CM | POA: Diagnosis not present

## 2020-07-11 DIAGNOSIS — E1151 Type 2 diabetes mellitus with diabetic peripheral angiopathy without gangrene: Secondary | ICD-10-CM | POA: Diagnosis not present

## 2020-07-11 DIAGNOSIS — E785 Hyperlipidemia, unspecified: Secondary | ICD-10-CM

## 2020-07-11 LAB — LIPID PANEL
Cholesterol: 160 mg/dL (ref 0–200)
HDL: 74.4 mg/dL (ref >39.00)
LDL Cholesterol: 73 mg/dL (ref 0–99)
NonHDL: 85.77
Total CHOL/HDL Ratio: 2
Triglycerides: 63 mg/dL (ref 0.0–149.0)
VLDL: 12.6 mg/dL (ref 0.0–40.0)

## 2020-07-11 LAB — CBC WITH DIFFERENTIAL/PLATELET
Basophils Absolute: 0.1 10*3/uL (ref 0.0–0.1)
Basophils Relative: 0.8 % (ref 0.0–3.0)
Eosinophils Absolute: 0.6 10*3/uL (ref 0.0–0.7)
Eosinophils Relative: 7.9 % — ABNORMAL HIGH (ref 0.0–5.0)
HCT: 43.4 % (ref 36.0–46.0)
Hemoglobin: 14.9 g/dL (ref 12.0–15.0)
Lymphocytes Relative: 18.7 % (ref 12.0–46.0)
Lymphs Abs: 1.5 10*3/uL (ref 0.7–4.0)
MCHC: 34.2 g/dL (ref 30.0–36.0)
MCV: 89.4 fl (ref 78.0–100.0)
Monocytes Absolute: 0.5 10*3/uL (ref 0.1–1.0)
Monocytes Relative: 6.1 % (ref 3.0–12.0)
Neutro Abs: 5.2 10*3/uL (ref 1.4–7.7)
Neutrophils Relative %: 66.5 % (ref 43.0–77.0)
Platelets: 267 10*3/uL (ref 150.0–400.0)
RBC: 4.85 Mil/uL (ref 3.87–5.11)
RDW: 13.6 % (ref 11.5–15.5)
WBC: 7.8 10*3/uL (ref 4.0–10.5)

## 2020-07-11 LAB — COMPREHENSIVE METABOLIC PANEL
ALT: 26 U/L (ref 0–35)
AST: 17 U/L (ref 0–37)
Albumin: 4.6 g/dL (ref 3.5–5.2)
Alkaline Phosphatase: 70 U/L (ref 39–117)
BUN: 23 mg/dL (ref 6–23)
CO2: 25 mEq/L (ref 19–32)
Calcium: 10.2 mg/dL (ref 8.4–10.5)
Chloride: 99 mEq/L (ref 96–112)
Creatinine, Ser: 0.9 mg/dL (ref 0.40–1.20)
GFR: 64.76 mL/min (ref >60.00)
Glucose, Bld: 220 mg/dL — ABNORMAL HIGH (ref 70–99)
Potassium: 4 mEq/L (ref 3.5–5.1)
Sodium: 135 mEq/L (ref 135–145)
Total Bilirubin: 0.7 mg/dL (ref 0.2–1.2)
Total Protein: 7.5 g/dL (ref 6.0–8.3)

## 2020-07-11 LAB — MICROALBUMIN / CREATININE URINE RATIO
Creatinine,U: 80.1 mg/dL
Microalb Creat Ratio: 14.5 mg/g (ref 0.0–30.0)
Microalb, Ur: 11.6 mg/dL — ABNORMAL HIGH (ref 0.0–1.9)

## 2020-07-11 LAB — HEMOGLOBIN A1C: Hgb A1c MFr Bld: 7.4 % — ABNORMAL HIGH (ref 4.6–6.5)

## 2020-07-11 LAB — VITAMIN B12: Vitamin B-12: >1506 pg/mL — ABNORMAL HIGH (ref 211–911)

## 2020-07-11 MED ORDER — RYBELSUS 3 MG PO TABS
3.0000 mg | ORAL_TABLET | Freq: Every day | ORAL | 0 refills | Status: DC
  Filled 2020-07-11: qty 30, 30d supply, fill #0

## 2020-07-11 MED ORDER — RYBELSUS 7 MG PO TABS
7.0000 mg | ORAL_TABLET | Freq: Every day | ORAL | 5 refills | Status: DC
  Filled 2020-07-11: qty 30, fill #0

## 2020-07-12 ENCOUNTER — Other Ambulatory Visit: Payer: Self-pay

## 2020-07-12 ENCOUNTER — Other Ambulatory Visit (HOSPITAL_COMMUNITY): Payer: Self-pay

## 2020-07-20 ENCOUNTER — Encounter: Payer: Self-pay | Admitting: Family Medicine

## 2020-08-01 ENCOUNTER — Telehealth: Payer: PPO

## 2020-08-07 ENCOUNTER — Other Ambulatory Visit: Payer: Self-pay | Admitting: Family Medicine

## 2020-08-08 ENCOUNTER — Other Ambulatory Visit (HOSPITAL_COMMUNITY): Payer: Self-pay

## 2020-08-08 MED ORDER — ONETOUCH ULTRA VI STRP
ORAL_STRIP | 2 refills | Status: DC
  Filled 2020-08-08: qty 100, 30d supply, fill #0
  Filled 2020-09-14: qty 100, 30d supply, fill #1
  Filled 2020-10-26: qty 100, 30d supply, fill #2
  Filled 2020-10-27 – 2020-10-31 (×2): qty 100, 30d supply, fill #0

## 2020-08-17 ENCOUNTER — Encounter: Payer: Self-pay | Admitting: Family Medicine

## 2020-08-18 ENCOUNTER — Encounter: Payer: Self-pay | Admitting: Family Medicine

## 2020-08-18 NOTE — Telephone Encounter (Signed)
Okay to send in [place] 100 mg tab?

## 2020-08-19 ENCOUNTER — Other Ambulatory Visit (HOSPITAL_COMMUNITY): Payer: Self-pay

## 2020-08-19 MED ORDER — SITAGLIPTIN PHOSPHATE 100 MG PO TABS
100.0000 mg | ORAL_TABLET | Freq: Every day | ORAL | 1 refills | Status: DC
  Filled 2020-08-19: qty 30, 30d supply, fill #0
  Filled 2020-09-26: qty 5, 5d supply, fill #0
  Filled 2020-09-26: qty 30, 30d supply, fill #0
  Filled 2020-10-21: qty 30, 30d supply, fill #1
  Filled 2020-11-17 – 2020-11-19 (×2): qty 30, 30d supply, fill #2
  Filled 2020-12-22: qty 30, 30d supply, fill #3
  Filled 2020-12-22 – 2020-12-23 (×3): qty 30, 30d supply, fill #0

## 2020-08-25 DIAGNOSIS — H2513 Age-related nuclear cataract, bilateral: Secondary | ICD-10-CM | POA: Diagnosis not present

## 2020-08-25 DIAGNOSIS — E113293 Type 2 diabetes mellitus with mild nonproliferative diabetic retinopathy without macular edema, bilateral: Secondary | ICD-10-CM | POA: Diagnosis not present

## 2020-08-25 DIAGNOSIS — Z7984 Long term (current) use of oral hypoglycemic drugs: Secondary | ICD-10-CM | POA: Diagnosis not present

## 2020-08-25 DIAGNOSIS — Z961 Presence of intraocular lens: Secondary | ICD-10-CM | POA: Diagnosis not present

## 2020-08-25 DIAGNOSIS — E113292 Type 2 diabetes mellitus with mild nonproliferative diabetic retinopathy without macular edema, left eye: Secondary | ICD-10-CM | POA: Diagnosis not present

## 2020-08-25 LAB — HM DIABETES EYE EXAM

## 2020-08-29 ENCOUNTER — Other Ambulatory Visit (HOSPITAL_COMMUNITY): Payer: Self-pay

## 2020-08-29 MED FILL — Glipizide Tab ER 24HR 10 MG: ORAL | 90 days supply | Qty: 90 | Fill #0 | Status: AC

## 2020-08-29 MED FILL — Amlodipine Besylate Tab 10 MG (Base Equivalent): ORAL | 90 days supply | Qty: 90 | Fill #0 | Status: AC

## 2020-09-05 ENCOUNTER — Other Ambulatory Visit (HOSPITAL_COMMUNITY): Payer: Self-pay

## 2020-09-13 ENCOUNTER — Other Ambulatory Visit: Payer: Self-pay | Admitting: Family Medicine

## 2020-09-13 ENCOUNTER — Other Ambulatory Visit (HOSPITAL_COMMUNITY): Payer: Self-pay

## 2020-09-13 MED ORDER — ATORVASTATIN CALCIUM 20 MG PO TABS
ORAL_TABLET | ORAL | 3 refills | Status: DC
  Filled 2020-09-13: qty 24, 84d supply, fill #0
  Filled 2020-12-10 – 2020-12-12 (×2): qty 24, 84d supply, fill #1
  Filled 2021-01-05: qty 24, 84d supply, fill #0

## 2020-09-14 ENCOUNTER — Other Ambulatory Visit (HOSPITAL_COMMUNITY): Payer: Self-pay

## 2020-09-15 ENCOUNTER — Encounter: Payer: Self-pay | Admitting: Family Medicine

## 2020-09-21 ENCOUNTER — Encounter: Payer: Self-pay | Admitting: Family Medicine

## 2020-09-26 ENCOUNTER — Other Ambulatory Visit: Payer: Self-pay | Admitting: Family Medicine

## 2020-09-26 ENCOUNTER — Other Ambulatory Visit (HOSPITAL_COMMUNITY): Payer: Self-pay

## 2020-09-26 MED ORDER — CARVEDILOL 6.25 MG PO TABS
6.2500 mg | ORAL_TABLET | Freq: Two times a day (BID) | ORAL | 0 refills | Status: DC
Start: 2020-09-26 — End: 2020-12-22
  Filled 2020-09-26: qty 180, 90d supply, fill #0

## 2020-09-26 MED FILL — Metformin HCl Tab 1000 MG: ORAL | 90 days supply | Qty: 225 | Fill #0 | Status: AC

## 2020-09-28 ENCOUNTER — Other Ambulatory Visit (HOSPITAL_COMMUNITY): Payer: Self-pay

## 2020-10-06 ENCOUNTER — Telehealth: Payer: Self-pay | Admitting: Pharmacist

## 2020-10-06 NOTE — Chronic Care Management (AMB) (Signed)
    Chronic Care Management Pharmacy Assistant   Name: Alexandra Fletcher  MRN: [id number] DOB: Aug 14, 1950   Reason for Encounter: Diabetes Disease State Call    Recent office visits:  07/11/2020 [facility] PCP Marin Olp, MD; chronic f/u, stable, no medication changes indicated.  Recent consult visits:  05/16/2020 [facility] Ophthalmology, Marilynne Halsted; no further information available.  Hospital visits:  None in previous 6 months  Medications: Outpatient Encounter Medications as of 10/06/2020  Medication Sig   amLODipine (NORVASC) 10 MG tablet TAKE 1 TABLET (10 MG TOTAL) BY MOUTH DAILY.   aspirin EC 81 MG tablet Take 81 mg by mouth daily.   atorvastatin (LIPITOR) 20 MG tablet Take 1 tablet by mouth 2 times a week.   Blood Glucose Monitoring Suppl (ONE TOUCH ULTRA 2) w/Device KIT USE TO TEST BLOOD SUGAR TWO TO THREE TIMES DAILY   carvedilol (COREG) 6.25 MG tablet Take 1 tablet (6.25 mg total) by mouth 2 (two) times daily.   glipiZIDE (GLUCOTROL XL) 10 MG 24 hr tablet TAKE 1 TABLET BY MOUTH EVERY DAY   glucose blood (ONETOUCH ULTRA) test strip USE TO TEST BLOOD GLUCOSE 2 TO 3 TIMES A DAY   metFORMIN (GLUCOPHAGE) 1000 MG tablet TAKE 1 TABLET BY MOUTH IN THE MORNING, 1/2 TABLET AT LUNCH AND 1 TABLET AT DINNER.   OneTouch Delica Lancets 10G MISC USE TO TEST BLOOD SUGAR TWO TO THREE TIMES DAILY   Semaglutide (RYBELSUS) 3 MG TABS Take 3 mg by mouth daily. 1st 30 days only- start with 3 mg dose.   Semaglutide (RYBELSUS) 7 MG TABS Take 7 mg by mouth daily. Start after 30 days of 3 mg dose then continue.   sitaGLIPtin (JANUVIA) 100 MG tablet Take 1 tablet (100 mg total) by mouth daily.   vitamin B-12 (CYANOCOBALAMIN) 100 MCG tablet    No facility-administered encounter medications on file as of 10/06/2020.    Recent Relevant Labs: Lab Results  Component Value Date/Time   HGBA1C 7.4 (H) 07/11/2020 08:38 AM   HGBA1C 7.0 (H) 01/07/2020 08:44 AM   MICROALBUR 11.6 (H) 07/11/2020  08:38 AM   MICROALBUR 3.5 (H) 07/07/2019 10:03 AM    Kidney Function Lab Results  Component Value Date/Time   CREATININE 0.90 07/11/2020 08:38 AM   CREATININE 0.81 07/07/2019 10:03 AM   GFR 64.76 07/11/2020 08:38 AM   GFRNONAA 69 05/16/2007 08:11 AM   GFRAA 83 05/16/2007 08:11 AM    Current antihyperglycemic regimen:   What recent interventions/DTPs have been made to improve glycemic control:   Have there been any recent hospitalizations or ED visits since last visit with CPP?  Patient  hypoglycemic symptoms, including  Patient  hyperglycemic symptoms, including  How often are you checking your blood sugar?  What are your blood sugars ranging?  Fasting:  Before meals:  After meals:  Bedtime:  During the week, how often does your blood glucose drop below 70?  Are you checking your feet daily/regularly?   Adherence Review: Is the patient currently on a STATIN medication?  Is the patient currently on ACE/ARB medication?  Does the patient have >5 day gap between last estimated fill dates?     Star Rating Drugs: Atorvastatin 20 mg last filled 09/14/2020  Unsuccessful attempt at reaching patient to complete this call.  April D Calhoun, [facility] Pharmacist Assistant [phone]

## 2020-10-21 ENCOUNTER — Other Ambulatory Visit (HOSPITAL_COMMUNITY): Payer: Self-pay

## 2020-10-27 ENCOUNTER — Ambulatory Visit (INDEPENDENT_AMBULATORY_CARE_PROVIDER_SITE_OTHER): Payer: Medicare Other

## 2020-10-27 ENCOUNTER — Other Ambulatory Visit (HOSPITAL_COMMUNITY): Payer: Self-pay

## 2020-10-27 DIAGNOSIS — Z0000 Encounter for general adult medical examination without abnormal findings: Secondary | ICD-10-CM | POA: Diagnosis not present

## 2020-10-27 NOTE — Progress Notes (Signed)
Virtual Visit via Telephone Note  I connected with  Alexandra Fletcher on 10/27/20 at 11:45 AM EDT by telephone and verified that I am speaking with the correct person using two identifiers.  Medicare Annual Wellness visit completed telephonically due to Covid-19 pandemic.   Persons participating in this call: This Health Coach and this patient.   Location: Patient: Home Provider: Office    I discussed the limitations, risks, security and privacy concerns of performing an evaluation and management service by telephone and the availability of in person appointments. The patient expressed understanding and agreed to proceed.  Unable to perform video visit due to video visit attempted and failed and/or patient does not have video capability.   Some vital signs may be absent or patient reported.   Willette Brace, LPN   Subjective:   Alexandra Fletcher is a 70 y.o. female who presents for Medicare Annual (Subsequent) preventive examination.  Review of Systems     Cardiac Risk Factors include: advanced age (>23mn, >>72women);diabetes mellitus;hypertension;dyslipidemia;obesity (BMI >30kg/m2)     Objective:    Today's Vitals   10/27/20 1134  PainSc: 7    There is no height or weight on file to calculate BMI.  Advanced Directives 10/27/2020 04/17/2019 07/25/2017 02/13/2017  Does Patient Have a Medical Advance Directive? Yes Yes No Yes  Type of Advance Directive Healthcare Power of [place] will  Does patient want to make changes to medical advance directive? - No - Patient declined - -  Copy of [place]? Yes - validated most recent copy scanned in chart (See row information) No - copy requested - -    Current Medications (verified) Outpatient Encounter Medications as of 10/27/2020  Medication Sig   amLODipine (NORVASC) 10 MG tablet TAKE 1 TABLET (10 MG TOTAL)  BY MOUTH DAILY.   aspirin EC 81 MG tablet Take 81 mg by mouth daily.   atorvastatin (LIPITOR) 20 MG tablet Take 1 tablet by mouth 2 times a week.   Blood Glucose Monitoring Suppl (ONE TOUCH ULTRA 2) w/Device KIT USE TO TEST BLOOD SUGAR TWO TO THREE TIMES DAILY   carvedilol (COREG) 6.25 MG tablet Take 1 tablet (6.25 mg total) by mouth 2 (two) times daily.   glipiZIDE (GLUCOTROL XL) 10 MG 24 hr tablet TAKE 1 TABLET BY MOUTH EVERY DAY   glucose blood (ONETOUCH ULTRA) test strip USE TO TEST BLOOD GLUCOSE 2 TO 3 TIMES A DAY   metFORMIN (GLUCOPHAGE) 1000 MG tablet TAKE 1 TABLET BY MOUTH IN THE MORNING, 1/2 TABLET AT LUNCH AND 1 TABLET AT DINNER.   OneTouch Delica Lancets 366ZMISC USE TO TEST BLOOD SUGAR TWO TO THREE TIMES DAILY   sitaGLIPtin (JANUVIA) 100 MG tablet Take 1 tablet (100 mg total) by mouth daily.   vitamin B-12 (CYANOCOBALAMIN) 100 MCG tablet    Semaglutide (RYBELSUS) 3 MG TABS Take 3 mg by mouth daily. 1st 30 days only- start with 3 mg dose. (Patient not taking: Reported on 10/27/2020)   Semaglutide (RYBELSUS) 7 MG TABS Take 7 mg by mouth daily. Start after 30 days of 3 mg dose then continue. (Patient not taking: Reported on 10/27/2020)   No facility-administered encounter medications on file as of 10/27/2020.    Allergies (verified) Lisinopril, Moxifloxacin, and Pioglitazone   History: Past Medical History:  Diagnosis Date   Allergy    Arthritis    Cataract    mild x  both eyes    Cerebral thrombosis with cerebral infarction [facility]) 2002   diagnosis needs confirmed by review of [facility]   Diabetes mellitus    Heart murmur    1x per Dr. Linna Darner- not recurrent   Hyperlipidemia    Hypertension    Partial rupture of Achilles tendon    After fluorquinolone use   Past Surgical History:  Procedure Laterality Date   CATARACT EXTRACTION, BILATERAL     COLONOSCOPY  2008   neg   DILATION AND CURETTAGE OF UTERUS     X2   KNEE ARTHROSCOPY W/ MENISCAL REPAIR  2008    Dr  Adella Nissen knee   Ovarian cysts removed      Dr Warnell Forester   POLYPECTOMY  2003   polypoid colonic mucosa/ 2008 no polyps   THUMB SURGERY  1983   Left; post skiing accident in [place]   Family History  Problem Relation Age of Onset   Liver disease Mother        ? malignancy after Hepatitis   Diabetes Father    Heart attack Father        CBAG @ 7   Multiple myeloma Father    Hyperlipidemia Father    Hypertension Father    Diabetes Brother        Type 1 DM   Heart attack Brother 68   Stroke Maternal Aunt        > 103   Breast cancer Cousin    Other Sister        died as child from pneumonia   Healthy Brother    Colon cancer Neg Hx    Colon polyps Neg Hx    Esophageal cancer Neg Hx    Rectal cancer Neg Hx    Stomach cancer Neg Hx    Social History   Socioeconomic History   Marital status: Married    Spouse name: Not on file   Number of children: 2   Years of education: Not on file   Highest education level: Not on file  Occupational History   Occupation: Agricultural engineer  Tobacco Use   Smoking status: Never   Smokeless tobacco: Never  Substance and Sexual Activity   Alcohol use: Yes    Alcohol/week: 7.0 standard drinks    Types: 7 Glasses of wine per week   Drug use: No   Sexual activity: Yes    Birth control/protection: Post-menopausal  Other Topics Concern   Not on file  Social History Narrative   Married 1976. 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[employer])      Hobbies: enjoys travel, plays bridge   Social Determinants of Radio broadcast assistant Strain: Low Risk    Difficulty of Paying Living Expenses: Not hard at all  Food Insecurity: No Food Insecurity   Worried About Charity fundraiser in the Last Year: Never true   Arboriculturist in the Last Year: Never true  Transportation Needs: No Transportation Needs   Lack of Transportation  (Medical): No   Lack of Transportation (Non-Medical): No  Physical Activity: Inactive   Days of Exercise per Week: 0 days   Minutes of Exercise per Session: 0 min  Stress: No Stress Concern Present   Feeling of Stress : Not at all  Social Connections: Moderately Integrated   Frequency  of Communication with Friends and Family: Three times a week   Frequency of Social Gatherings with Friends and Family: More than three times a week   Attends Religious Services: More than 4 times per year   Active Member of [employer] or Organizations: No   Attends Music therapist: Never   Marital Status: Married    Tobacco Counseling Counseling given: Not Answered   Clinical Intake:  Pre-visit preparation completed: Yes  Pain : 0-10 Pain Score: 7  Pain Type: Chronic pain (flare up) Pain Location: Knee Pain Orientation: Right Pain Descriptors / Indicators: Sharp, Throbbing (when bending and walking) Pain Onset: 1 to 4 weeks ago Pain Frequency: Intermittent     BMI - recorded: 31.47 Nutritional Status: BMI > 30  Obese Nutritional Risks: None Diabetes: Yes CBG done?: Yes (105) CBG resulted in Enter/ Edit results?: No Did pt. bring in CBG monitor from home?: No  How often do you need to have someone help you when you read instructions, pamphlets, or other written materials from your doctor or pharmacy?: 1 - Never  Diabetic?Nutrition Risk Assessment:  Has the patient had any N/V/D within the last 2 months?  No  Does the patient have any non-healing wounds?  No  Has the patient had any unintentional weight loss or weight gain?  No   Diabetes:  Is the patient diabetic?  Yes  If diabetic, was a CBG obtained today?  Yes  Did the patient bring in their glucometer from home?  No  How often do you monitor your CBG's? Daily .   Financial Strains and Diabetes Management:  Are you having any financial strains with the device, your supplies or your medication? No .  Does the  patient want to be seen by Chronic Care Management for management of their diabetes?  No  Would the patient like to be referred to a Nutritionist or for Diabetic Management?  No   Diabetic Exams:  Diabetic Eye Exam: Completed 08/25/20 Diabetic Foot Exam: Completed 01/07/20   Interpreter Needed?: No  Information entered by :: Charlott Rakes, LPN   Activities of Daily Living In your present state of health, do you have any difficulty performing the following activities: 10/27/2020 07/11/2020  Hearing? N N  Vision? N N  Difficulty concentrating or making decisions? N N  Walking or climbing stairs? N N  Dressing or bathing? N N  Doing errands, shopping? N N  Preparing Food and eating ? N -  Using the Toilet? N -  In the past six months, have you accidently leaked urine? N -  Do you have problems with loss of bowel control? N -  Managing your Medications? N -  Managing your Finances? N -  Housekeeping or managing your Housekeeping? N -  Some recent data might be hidden    Patient Care Team: Marin Olp, MD as PCP - General (Family Medicine) Gerda Diss, DO as Consulting Physician (Sports Medicine) [name], [facility] as Pharmacist (Pharmacist)  Indicate any recent Medical Services you may have received from other than [facility] providers in the past year (date may be approximate).     Assessment:   This is a routine wellness examination for [facility].  Hearing/Vision screen Hearing Screening - Comments:: Pt denies any hearing issues  Vision Screening - Comments:: Pt follows up with [facility] for annual eye exams   Dietary issues and exercise activities discussed: Current Exercise Habits: The patient does not participate in regular exercise at  present (pt will contniue exercise after knee get better)   Goals Addressed             This Visit's Progress    Patient Stated       None at this time        Depression Screen PHQ 2/9 Scores 10/27/2020  07/11/2020 07/07/2019 04/17/2019 02/03/2019 06/12/2018 03/06/2017  PHQ - 2 Score 0 0 0 0 0 0 0  PHQ- 9 Score - 1 0 - - - -    Fall Risk Fall Risk  10/27/2020 07/11/2020 04/17/2019 10/17/2018 03/06/2017  Falls in the past year? 0 0 0 0 No  Comment - - - Emmi Telephone Survey: data to providers prior to load -  Number falls in past yr: 0 0 0 - -  Injury with Fall? 0 0 0 - -  Follow up Falls prevention discussed - Falls evaluation completed;Education provided;Falls prevention discussed - -    FALL RISK PREVENTION PERTAINING TO THE HOME:  Any stairs in or around the home? Yes  If so, are there any without handrails? No  Home free of loose throw rugs in walkways, pet beds, electrical cords, etc? Yes  Adequate lighting in your home to reduce risk of falls? Yes   ASSISTIVE DEVICES UTILIZED TO PREVENT FALLS:  Life alert? Yes  apple watch  Use of a cane, walker or w/c? No  Grab bars in the bathroom? No  Shower chair or bench in shower? No  Elevated toilet seat or a handicapped toilet? No   TIMED UP AND GO:  Was the test performed? No .   Cognitive Function:     6CIT Screen 04/17/2019  What Year? 0 points  What month? 0 points  What time? 0 points  Count back from 20 0 points  Months in reverse 0 points  Repeat phrase 0 points  Total Score 0    Immunizations Immunization History  Administered Date(s) Administered   Fluad Quad(high Dose 65+) 12/15/2019   Influenza Split 02/22/2011, 12/03/2011   Influenza Whole 01/15/2007, 12/17/2007, 03/03/2008, 12/07/2009   Influenza, High Dose Seasonal PF 01/13/2016, 12/27/2016, 12/10/2017   Influenza,inj,Quad PF,6+ Mos 01/20/2013   Influenza-Unspecified 12/30/2014, 11/06/2018   PFIZER(Purple Top)SARS-COV-2 Vaccination 04/04/2019, 04/22/2019, 12/15/2019   Pneumococcal Conjugate-13 02/21/2015   Pneumococcal Polysaccharide-23 01/24/2017   Td 05/21/2002   Tdap 02/11/2013   Zoster Recombinat (Shingrix) 02/12/2017, 06/03/2017   Zoster, Live  01/05/2011    TDAP status: Up to date  Flu Vaccine status: Due, Education has been provided regarding the importance of this vaccine. Advised may receive this vaccine at local pharmacy or Health Dept. Aware to provide a copy of the vaccination record if obtained from local pharmacy or Health Dept. Verbalized acceptance and understanding.  Pneumococcal vaccine status: Up to date  Covid-19 vaccine status: Completed vaccines  Qualifies for Shingles Vaccine? Yes   Zostavax completed Yes   Shingrix Completed?: Yes  Screening Tests Health Maintenance  Topic Date Due   COVID-19 Vaccine (4 - Booster for Pfizer series) 03/15/2020   INFLUENZA VACCINE  10/17/2020   FOOT EXAM  01/06/2021   HEMOGLOBIN A1C  01/10/2021   URINE MICROALBUMIN  07/11/2021   OPHTHALMOLOGY EXAM  08/25/2021   MAMMOGRAM  11/09/2021   COLONOSCOPY (Pts 45-73yr Insurance coverage will need to be confirmed)  07/26/2022   TETANUS/TDAP  02/12/2023   DEXA SCAN  Completed   Hepatitis C Screening  Completed   PNA vac Low Risk Adult  Completed   Zoster Vaccines- Shingrix  Completed   HPV VACCINES  Aged Out    Health Maintenance  Health Maintenance Due  Topic Date Due   COVID-19 Vaccine (4 - Booster for Pfizer series) 03/15/2020   INFLUENZA VACCINE  10/17/2020    Colorectal cancer screening: Type of screening: Colonoscopy. Completed 07/25/17. Repeat every 5 years  Mammogram status: Completed 11/10/19. Repeat every year  Bone Density status: Completed 12/14/19. Results reflect: Bone density results: NORMAL. Repeat every 2 years.   Additional Screening:  Hepatitis C Screening:  Completed 02/21/15  Vision Screening: Recommended annual ophthalmology exams for early detection of glaucoma and other disorders of the eye. Is the patient up to date with their annual eye exam?  Yes  Who is the provider or what is the name of the office in which the patient attends annual eye exams? [facility] If pt is not  established with a provider, would they like to be referred to a provider to establish care? No .   Dental Screening: Recommended annual dental exams for proper oral hygiene  Community Resource Referral / Chronic Care Management: CRR required this visit?  No   CCM required this visit?  No      Plan:     I have personally reviewed and noted the following in the patient's chart:   Medical and social history Use of alcohol, tobacco or illicit drugs  Current medications and supplements including opioid prescriptions.  Functional ability and status Nutritional status Physical activity Advanced directives List of other physicians Hospitalizations, surgeries, and ER visits in previous 12 months Vitals Screenings to include cognitive, depression, and falls Referrals and appointments  In addition, I have reviewed and discussed with patient certain preventive protocols, quality metrics, and best practice recommendations. A written personalized care plan for preventive services as well as general preventive health recommendations were provided to patient.     Willette Brace, LPN   [id number]   Nurse Notes: None

## 2020-10-27 NOTE — Patient Instructions (Signed)
Alexandra Fletcher , Thank you for taking time to come for your Medicare Wellness Visit. I appreciate your ongoing commitment to your health goals. Please review the following plan we discussed and let me know if I can assist you in the future.   Screening recommendations/referrals: Colonoscopy: Done 07/25/17 repeat every 5 years due 07/26/22 Mammogram: Done 11/10/19 repeat every year Bone Density: Done 12/14/19 repeat evry 2 years  Recommended yearly ophthalmology/optometry visit for glaucoma screening and checkup Recommended yearly dental visit for hygiene and checkup  Vaccinations: Influenza vaccine: Due after 10/17/20 Pneumococcal vaccine: Up to date Tdap vaccine: Completed 02/11/13 repeat every 10 years 02/12/23 Shingles vaccine: Completed 02/12/17 & 06/03/17   Covid-19:Completed 1/16, 2/3, 12/15/19   Advanced directives: Copies in chart  Conditions/risks identified: None at this time  Next appointment: Follow up in one year for your annual wellness visit     Preventive Care 26 Years and Older, Female Preventive care refers to lifestyle choices and visits with your health care provider that can promote health and wellness. What does preventive care include? A yearly physical exam. This is also called an annual well check. Dental exams once or twice a year. Routine eye exams. Ask your health care provider how often you should have your eyes checked. Personal lifestyle choices, including: Daily care of your teeth and gums. Regular physical activity. Eating a healthy diet. Avoiding tobacco and drug use. Limiting alcohol use. Practicing safe sex. Taking low-dose aspirin every day. Taking vitamin and mineral supplements as recommended by your health care provider. What happens during an annual well check? The services and screenings done by your health care provider during your annual well check will depend on your age, overall health, lifestyle risk factors, and family history of  disease. Counseling  Your health care provider may ask you questions about your: Alcohol use. Tobacco use. Drug use. Emotional well-being. Home and relationship well-being. Sexual activity. Eating habits. History of falls. Memory and ability to understand (cognition). Work and work Statistician. Reproductive health. Screening  You may have the following tests or measurements: Height, weight, and BMI. Blood pressure. Lipid and cholesterol levels. These may be checked every 5 years, or more frequently if you are over 19 years old. Skin check. Lung cancer screening. You may have this screening every year starting at age 42 if you have a 30-pack-year history of smoking and currently smoke or have quit within the past 15 years. Fecal occult blood test (FOBT) of the stool. You may have this test every year starting at age 55. Flexible sigmoidoscopy or colonoscopy. You may have a sigmoidoscopy every 5 years or a colonoscopy every 10 years starting at age 68. Hepatitis C blood test. Hepatitis B blood test. Sexually transmitted disease (STD) testing. Diabetes screening. This is done by checking your blood sugar (glucose) after you have not eaten for a while (fasting). You may have this done every 1-3 years. Bone density scan. This is done to screen for osteoporosis. You may have this done starting at age 65. Mammogram. This may be done every 1-2 years. Talk to your health care provider about how often you should have regular mammograms. Talk with your health care provider about your test results, treatment options, and if necessary, the need for more tests. Vaccines  Your health care provider may recommend certain vaccines, such as: Influenza vaccine. This is recommended every year. Tetanus, diphtheria, and acellular pertussis (Tdap, Td) vaccine. You may need a Td booster every 10 years. Zoster vaccine. You may  need this after age 17. Pneumococcal 13-valent conjugate (PCV13) vaccine. One  dose is recommended after age 5. Pneumococcal polysaccharide (PPSV23) vaccine. One dose is recommended after age 77. Talk to your health care provider about which screenings and vaccines you need and how often you need them. This information is not intended to replace advice given to you by your health care provider. Make sure you discuss any questions you have with your health care provider. Document Released: 04/01/2015 Document Revised: 11/23/2015 Document Reviewed: 01/04/2015 Elsevier Interactive Patient Education  2017 [place] Prevention in the Home Falls can cause injuries. They can happen to people of all ages. There are many things you can do to make your home safe and to help prevent falls. What can I do on the outside of my home? Regularly fix the edges of walkways and driveways and fix any cracks. Remove anything that might make you trip as you walk through a door, such as a raised step or threshold. Trim any bushes or trees on the path to your home. Use bright outdoor lighting. Clear any walking paths of anything that might make someone trip, such as rocks or tools. Regularly check to see if handrails are loose or broken. Make sure that both sides of any steps have handrails. Any raised decks and porches should have guardrails on the edges. Have any leaves, snow, or ice cleared regularly. Use sand or salt on walking paths during winter. Clean up any spills in your garage right away. This includes oil or grease spills. What can I do in the bathroom? Use night lights. Install grab bars by the toilet and in the tub and shower. Do not use towel bars as grab bars. Use non-skid mats or decals in the tub or shower. If you need to sit down in the shower, use a plastic, non-slip stool. Keep the floor dry. Clean up any water that spills on the floor as soon as it happens. Remove soap buildup in the tub or shower regularly. Attach bath mats securely with double-sided  non-slip rug tape. Do not have throw rugs and other things on the floor that can make you trip. What can I do in the bedroom? Use night lights. Make sure that you have a light by your bed that is easy to reach. Do not use any sheets or blankets that are too big for your bed. They should not hang down onto the floor. Have a firm chair that has side arms. You can use this for support while you get dressed. Do not have throw rugs and other things on the floor that can make you trip. What can I do in the kitchen? Clean up any spills right away. Avoid walking on wet floors. Keep items that you use a lot in easy-to-reach places. If you need to reach something above you, use a strong step stool that has a grab bar. Keep electrical cords out of the way. Do not use floor polish or wax that makes floors slippery. If you must use wax, use non-skid floor wax. Do not have throw rugs and other things on the floor that can make you trip. What can I do with my stairs? Do not leave any items on the stairs. Make sure that there are handrails on both sides of the stairs and use them. Fix handrails that are broken or loose. Make sure that handrails are as long as the stairways. Check any carpeting to make sure that it is firmly attached  to the stairs. Fix any carpet that is loose or worn. Avoid having throw rugs at the top or bottom of the stairs. If you do have throw rugs, attach them to the floor with carpet tape. Make sure that you have a light switch at the top of the stairs and the bottom of the stairs. If you do not have them, ask someone to add them for you. What else can I do to help prevent falls? Wear shoes that: Do not have high heels. Have rubber bottoms. Are comfortable and fit you well. Are closed at the toe. Do not wear sandals. If you use a stepladder: Make sure that it is fully opened. Do not climb a closed stepladder. Make sure that both sides of the stepladder are locked into place. Ask  someone to hold it for you, if possible. Clearly mark and make sure that you can see: Any grab bars or handrails. First and last steps. Where the edge of each step is. Use tools that help you move around (mobility aids) if they are needed. These include: Canes. Walkers. Scooters. Crutches. Turn on the lights when you go into a dark area. Replace any light bulbs as soon as they burn out. Set up your furniture so you have a clear path. Avoid moving your furniture around. If any of your floors are uneven, fix them. If there are any pets around you, be aware of where they are. Review your medicines with your doctor. Some medicines can make you feel dizzy. This can increase your chance of falling. Ask your doctor what other things that you can do to help prevent falls. This information is not intended to replace advice given to you by your health care provider. Make sure you discuss any questions you have with your health care provider. Document Released: 12/30/2008 Document Revised: 08/11/2015 Document Reviewed: 04/09/2014 Elsevier Interactive Patient Education  2017 [employer].

## 2020-10-28 ENCOUNTER — Other Ambulatory Visit (HOSPITAL_COMMUNITY): Payer: Self-pay

## 2020-10-31 ENCOUNTER — Other Ambulatory Visit (HOSPITAL_COMMUNITY): Payer: Self-pay

## 2020-11-01 ENCOUNTER — Other Ambulatory Visit (HOSPITAL_COMMUNITY): Payer: Self-pay

## 2020-11-02 ENCOUNTER — Telehealth: Payer: Self-pay | Admitting: Pharmacist

## 2020-11-02 NOTE — Chronic Care Management (AMB) (Addendum)
Chronic Care Management Pharmacy Assistant   Name: Alexandra Fletcher  MRN: [id number] DOB: 21-Mar-1950  Reason for Encounter: Diabetes Adherence Call    Recent office visits:  07/11/2020 [facility] PCP Marin Olp, MD; if A1c is higher than 7.5 start Rybellsus if lower continue Januvia, no medication changes indicated.  Recent consult visits:  05/16/2020 [facility] Ophthalmology, Marilynne Halsted; no further information available.  Hospital visits:  None in previous 6 months  Medications: Outpatient Encounter Medications as of 11/02/2020  Medication Sig   amLODipine (NORVASC) 10 MG tablet TAKE 1 TABLET (10 MG TOTAL) BY MOUTH DAILY.   aspirin EC 81 MG tablet Take 81 mg by mouth daily.   atorvastatin (LIPITOR) 20 MG tablet Take 1 tablet by mouth 2 times a week.   Blood Glucose Monitoring Suppl (ONE TOUCH ULTRA 2) w/Device KIT USE TO TEST BLOOD SUGAR TWO TO THREE TIMES DAILY   carvedilol (COREG) 6.25 MG tablet Take 1 tablet (6.25 mg total) by mouth 2 (two) times daily.   glipiZIDE (GLUCOTROL XL) 10 MG 24 hr tablet TAKE 1 TABLET BY MOUTH EVERY DAY   glucose blood (ONETOUCH ULTRA) test strip USE TO TEST BLOOD GLUCOSE 2 TO 3 TIMES A DAY   metFORMIN (GLUCOPHAGE) 1000 MG tablet TAKE 1 TABLET BY MOUTH IN THE MORNING, 1/2 TABLET AT LUNCH AND 1 TABLET AT DINNER.   OneTouch Delica Lancets 16L MISC USE TO TEST BLOOD SUGAR TWO TO THREE TIMES DAILY   Semaglutide (RYBELSUS) 3 MG TABS Take 3 mg by mouth daily. 1st 30 days only- start with 3 mg dose. (Patient not taking: Reported on 10/27/2020)   Semaglutide (RYBELSUS) 7 MG TABS Take 7 mg by mouth daily. Start after 30 days of 3 mg dose then continue. (Patient not taking: Reported on 10/27/2020)   sitaGLIPtin (JANUVIA) 100 MG tablet Take 1 tablet (100 mg total) by mouth daily.   vitamin B-12 (CYANOCOBALAMIN) 100 MCG tablet    No facility-administered encounter medications on file as of 11/02/2020.   Recent Relevant Labs: Lab Results  Component  Value Date/Time   HGBA1C 7.4 (H) 07/11/2020 08:38 AM   HGBA1C 7.0 (H) 01/07/2020 08:44 AM   MICROALBUR 11.6 (H) 07/11/2020 08:38 AM   MICROALBUR 3.5 (H) 07/07/2019 10:03 AM    Kidney Function Lab Results  Component Value Date/Time   CREATININE 0.90 07/11/2020 08:38 AM   CREATININE 0.81 07/07/2019 10:03 AM   GFR 64.76 07/11/2020 08:38 AM   GFRNONAA 69 05/16/2007 08:11 AM   GFRAA 83 05/16/2007 08:11 AM    Current antihyperglycemic regimen:  Januvia 100 mg daily Glipizide 10 mg daily Metformin 1000 mg , 1/2 tablet at lunch and 1 tablet at dinner.  What recent interventions/DTPs have been made to improve glycemic control:  Dr. Yong Channel prescribed Rybellsus in place of Januvia. Patient picked up the prescription but did not start taking it. She states she is worried about some of the possible side effects. Patient states she has read that Rybellsus can cause Retinopathy and Thyroid cancer. She it also concerned about having nausea and diarrhea. She also states it's also more expensive. Patient would like to know the pharmacist input on this medication and the possible side effects.  Have there been any recent hospitalizations or ED visits since last visit with CPP? No  Patient denies hypoglycemic symptoms.  Patient denies hyperglycemic symptoms.  How often are you checking your blood sugar? 3-4 times daily  What are your blood sugars ranging? 30 Day average of  142. Fasting: 98, 100 Before meals: n/a After meals: 155, 175 Bedtime: n/a  During the week, how often does your blood glucose drop below 70? Never  Are you checking your feet daily/regularly? Patient states she checks her feet regularly.  Adherence Review: Is the patient currently on a STATIN medication? Yes Is the patient currently on ACE/ARB medication? No Does the patient have >5 day gap between last estimated fill dates? No  Future Appointments  Date Time Provider [place]  01/10/2021  8:40 AM Marin Olp, MD [facility]  11/02/2021 11:45 AM [facility] HEALTH COACH [facility]    Star Rating Drugs: Atorvastatin 20 mg last filled 09/14/2020 84 DS Januvia 100 mg last filled 10/21/2020 30 DS Rybelsus 3 mg last filled 07/13/2020 30 DS Glipizide 10 mg last filled 08/30/2020 90 DS Metformin 1000 mg last filled 09/26/2020 90 DS  April D Calhoun, [place] Pharmacist Assistant [phone]   10 minutes spent in review, coordination, and documentation. Rybelsus is much more effective in controlling blood sugars.  The side effects are any and every side effect ever seen during a study and the incidence of those are very rare.  It also provides the added benefit of weight loss.  If cost is a concern we can look at patient assistance!!  Reviewed by: Beverly Milch, PharmD Clinical Pharmacist [phone]

## 2020-11-07 ENCOUNTER — Ambulatory Visit: Payer: Medicare Other | Admitting: Family Medicine

## 2020-11-07 ENCOUNTER — Other Ambulatory Visit: Payer: Self-pay

## 2020-11-07 VITALS — Ht 63.0 in | Wt 173.0 lb

## 2020-11-07 DIAGNOSIS — M25561 Pain in right knee: Secondary | ICD-10-CM | POA: Diagnosis not present

## 2020-11-07 NOTE — Patient Instructions (Signed)
Your pain is due to arthritis. Your exam is very reassuring otherwise. These are the different medications you can take for this: Aleve 2 tabs twice a day with food for 7-10 days then as needed. Tylenol '500mg'$  1-2 tabs three times a day for pain. Capsaicin, aspercreme, or biofreeze topically up to four times a day may also help with pain. Some supplements that may help for arthritis: Boswellia extract, curcumin, pycnogenol Cortisone injections are an option. If cortisone injections do not help, there are different types of shots that may help but they take longer to take effect. It's important that you continue to stay active. Straight leg raises, knee extensions 3 sets of 10 once a day (add ankle weight if these become too easy). Consider physical therapy to strengthen muscles around the joint that hurts to take pressure off of the joint itself. Heat or ice 15 minutes at a time 3-4 times a day as needed to help with pain. Follow up with me in 1 month or as needed if you're doing great!

## 2020-11-07 NOTE — Progress Notes (Signed)
PCP: Marin Olp, MD  Subjective:   HPI: Patient is a 70 y.o. female here for right knee pain since mid July.  -Woke up and stretched in bed, felt a pulling pain behind right knee - No pop, swelling, bruising - Now has worsened right knee pain of anterior medial knee when she is on her feet for too long - Aggravating: Weightbearing exercise, walking - Relieving: Rest, Aleve  Has a history of right meniscal tear s/p arthroscopic surgery 2008.  She also reports that her knee has cracked ever since childhood.  Previously diagnosed with Baker's cyst, unsure if still there.  She has been to a prior physical therapy twice (2013, 2021) for knee related exercises.  Past Medical History:  Diagnosis Date   Allergy    Arthritis    Cataract    mild x both eyes    Cerebral thrombosis with cerebral infarction [facility]) 2002   diagnosis needs confirmed by review of [place] records   Diabetes mellitus    Heart murmur    1x per Dr. Linna Darner- not recurrent   Hyperlipidemia    Hypertension    Partial rupture of Achilles tendon    After fluorquinolone use    Current Outpatient Medications on File Prior to Visit  Medication Sig Dispense Refill   amLODipine (NORVASC) 10 MG tablet TAKE 1 TABLET (10 MG TOTAL) BY MOUTH DAILY. 90 tablet 3   aspirin EC 81 MG tablet Take 81 mg by mouth daily.     atorvastatin (LIPITOR) 20 MG tablet Take 1 tablet by mouth 2 times a week. 24 tablet 3   Blood Glucose Monitoring Suppl (ONE TOUCH ULTRA 2) w/Device KIT USE TO TEST BLOOD SUGAR TWO TO THREE TIMES DAILY 1 kit 0   carvedilol (COREG) 6.25 MG tablet Take 1 tablet (6.25 mg total) by mouth 2 (two) times daily. 180 tablet 0   glipiZIDE (GLUCOTROL XL) 10 MG 24 hr tablet TAKE 1 TABLET BY MOUTH EVERY DAY 90 tablet 2   glucose blood (ONETOUCH ULTRA) test strip USE TO TEST BLOOD GLUCOSE 2 TO 3 TIMES A DAY 100 strip 2   metFORMIN (GLUCOPHAGE) 1000 MG tablet TAKE 1 TABLET BY MOUTH IN THE MORNING, 1/2 TABLET AT LUNCH  AND 1 TABLET AT DINNER. 225 tablet 3   OneTouch Delica Lancets 57W MISC USE TO TEST BLOOD SUGAR TWO TO THREE TIMES DAILY 100 each 1   Semaglutide (RYBELSUS) 3 MG TABS Take 3 mg by mouth daily. 1st 30 days only- start with 3 mg dose. (Patient not taking: Reported on 10/27/2020) 30 tablet 0   Semaglutide (RYBELSUS) 7 MG TABS Take 7 mg by mouth daily. Start after 30 days of 3 mg dose then continue. (Patient not taking: Reported on 10/27/2020) 30 tablet 5   sitaGLIPtin (JANUVIA) 100 MG tablet Take 1 tablet (100 mg total) by mouth daily. 90 tablet 1   vitamin B-12 (CYANOCOBALAMIN) 100 MCG tablet      No current facility-administered medications on file prior to visit.    Past Surgical History:  Procedure Laterality Date   CATARACT EXTRACTION, BILATERAL     COLONOSCOPY  2008   neg   DILATION AND CURETTAGE OF UTERUS     X2   KNEE ARTHROSCOPY W/ MENISCAL REPAIR  2008    Dr Adella Nissen knee   Ovarian cysts removed      Dr Warnell Forester   POLYPECTOMY  2003   polypoid colonic mucosa/ 2008 no polyps   THUMB SURGERY  1983   Left; post skiing accident in [place]    Allergies  Allergen Reactions   Lisinopril     Swelling of tongue   Moxifloxacin     Achilles tendon rupture potentially   Pioglitazone     REACTION: weight gain & ankle edema    Social History   Socioeconomic History   Marital status: Married    Spouse name: Not on file   Number of children: 2   Years of education: Not on file   Highest education level: Not on file  Occupational History   Occupation: Agricultural engineer  Tobacco Use   Smoking status: Never   Smokeless tobacco: Never  Substance and Sexual Activity   Alcohol use: Yes    Alcohol/week: 7.0 standard drinks    Types: 7 Glasses of wine per week   Drug use: No   Sexual activity: Yes    Birth control/protection: Post-menopausal  Other Topics Concern   Not on file  Social History Narrative   Married 1976. 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[employer])      Hobbies: enjoys travel, plays bridge   Social Determinants of Radio broadcast assistant Strain: Low Risk    Difficulty of Paying Living Expenses: Not hard at all  Food Insecurity: No Food Insecurity   Worried About Charity fundraiser in the Last Year: Never true   Arboriculturist in the Last Year: Never true  Transportation Needs: No Transportation Needs   Lack of Transportation (Medical): No   Lack of Transportation (Non-Medical): No  Physical Activity: Inactive   Days of Exercise per Week: 0 days   Minutes of Exercise per Session: 0 min  Stress: No Stress Concern Present   Feeling of Stress : Not at all  Social Connections: Moderately Integrated   Frequency of Communication with Friends and Family: Three times a week   Frequency of Social Gatherings with Friends and Family: More than three times a week   Attends Religious Services: More than 4 times per year   Active Member of [employer] or Organizations: No   Attends Music therapist: Never   Marital Status: Married  Human resources officer Violence: Not At Risk   Fear of Current or Ex-Partner: No   Emotionally Abused: No   Physically Abused: No   Sexually Abused: No    Family History  Problem Relation Age of Onset   Liver disease Mother        ? malignancy after Hepatitis   Diabetes Father    Heart attack Father        CBAG @ 60   Multiple myeloma Father    Hyperlipidemia Father    Hypertension Father    Diabetes Brother        Type 1 DM   Heart attack Brother 26   Stroke Maternal Aunt        > 19   Breast cancer Cousin    Other Sister        died as child from pneumonia   Healthy Brother    Colon cancer Neg Hx    Colon polyps Neg Hx    Esophageal cancer Neg Hx    Rectal cancer Neg Hx    Stomach cancer Neg Hx     Ht 5' 3" (1.6 m)   Wt 173  lb (78.5 kg)   LMP  (LMP Unknown)   BMI 30.65  kg/m   No flowsheet data found.  No flowsheet data found.  Review of Systems: See HPI above.     Objective:  Height 5' 3" (1.6 m), weight 173 lb (78.5 kg).   Gen: NAD, comfortable in exam room  Right knee: Minimal effusion.  No gross deformity, ecchymoses. No TTP. FROM with normal strength. Negative ant/post drawers. Negative valgus/varus testing. Negative lachman. Negative mcmurrays, apleys, thessalys. NV intact distally.   Assessment & Plan:  1. Right knee pain - exam is reassuring.  Consistent with flare of arthropathy.  Aleve twice a day for 7-10 days then as needed.  Home exercises revewed.  Consider physical therapy, radiographs, injection if not improving.  F/u in 1 month or prn if doing well.  Ezequiel Essex, MD [place]

## 2020-11-08 ENCOUNTER — Encounter: Payer: Self-pay | Admitting: Family Medicine

## 2020-11-16 DIAGNOSIS — L723 Sebaceous cyst: Secondary | ICD-10-CM | POA: Diagnosis not present

## 2020-11-16 DIAGNOSIS — L570 Actinic keratosis: Secondary | ICD-10-CM | POA: Diagnosis not present

## 2020-11-16 DIAGNOSIS — E113293 Type 2 diabetes mellitus with mild nonproliferative diabetic retinopathy without macular edema, bilateral: Secondary | ICD-10-CM | POA: Diagnosis not present

## 2020-11-16 DIAGNOSIS — Z961 Presence of intraocular lens: Secondary | ICD-10-CM | POA: Diagnosis not present

## 2020-11-16 DIAGNOSIS — L821 Other seborrheic keratosis: Secondary | ICD-10-CM | POA: Diagnosis not present

## 2020-11-16 DIAGNOSIS — Z85828 Personal history of other malignant neoplasm of skin: Secondary | ICD-10-CM | POA: Diagnosis not present

## 2020-11-17 IMAGING — MG DIGITAL SCREENING BILAT W/ TOMO W/ CAD
6 of 12 series · 6 of 36 positions shown · non-contrast
Comparison: Previous exam(s).

CLINICAL DATA: Screening.

EXAM:
DIGITAL SCREENING BILATERAL MAMMOGRAM WITH TOMO AND CAD

[R CC synth-2D]
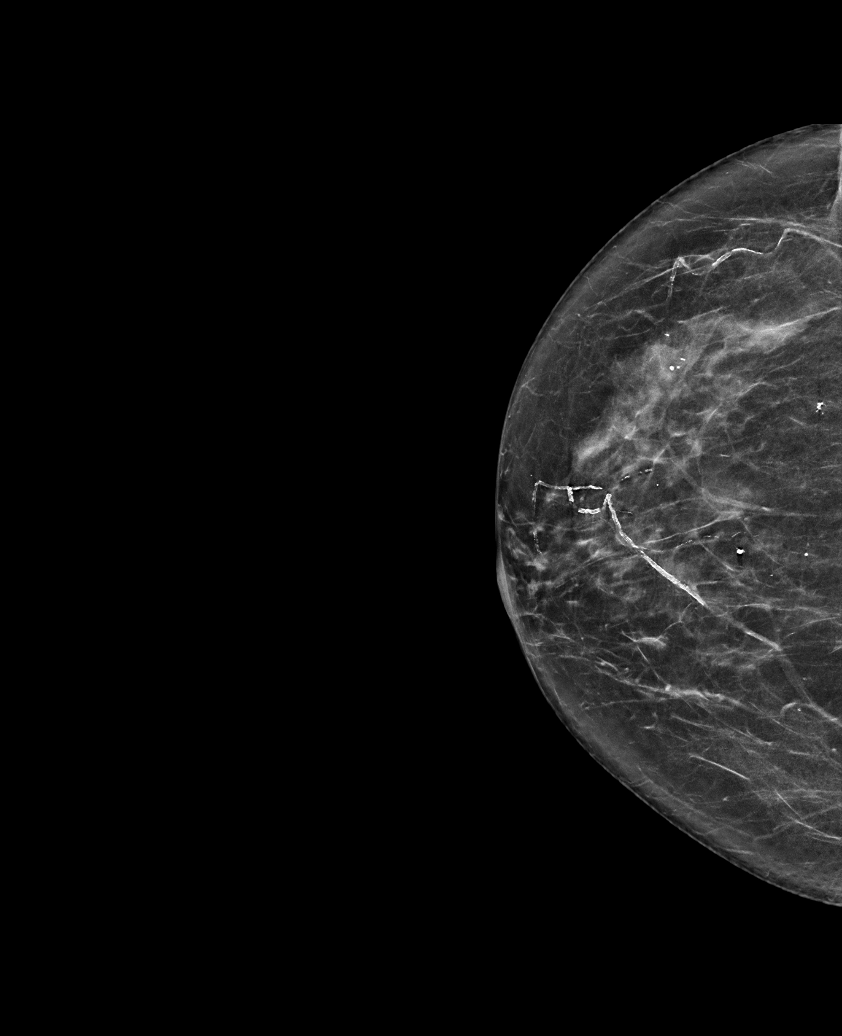

[L MLO synth-2D (1 of 2)]
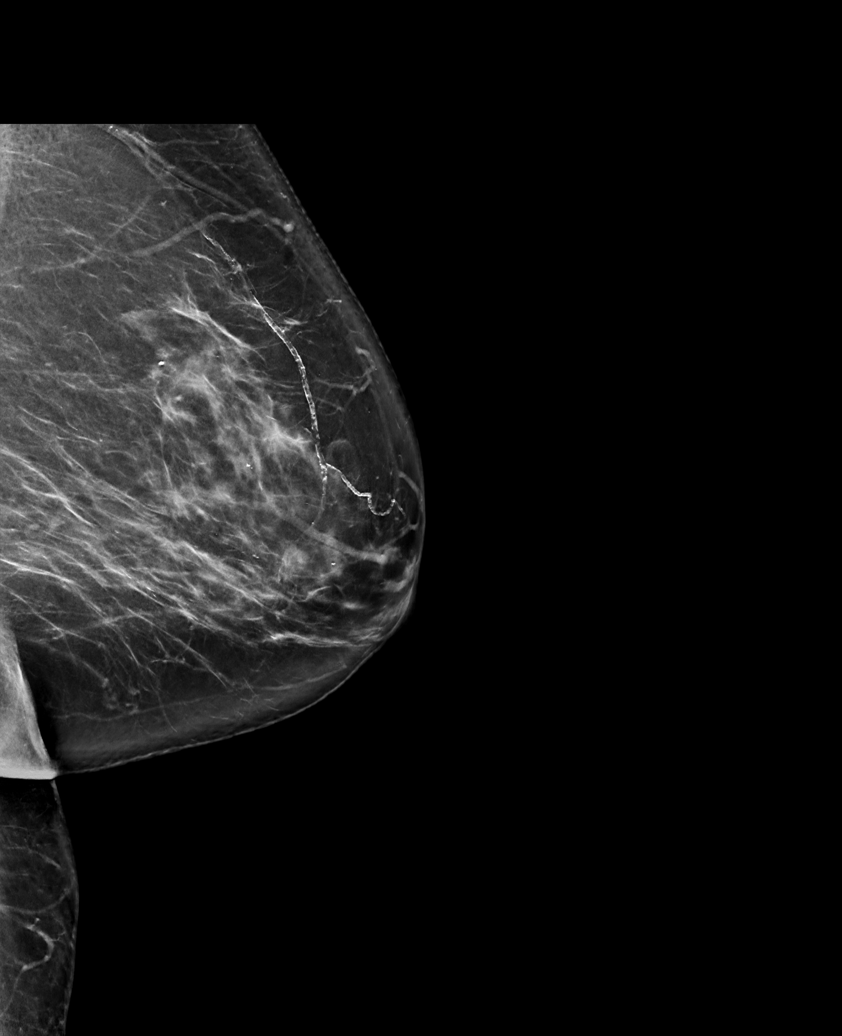

[L CC synth-2D]
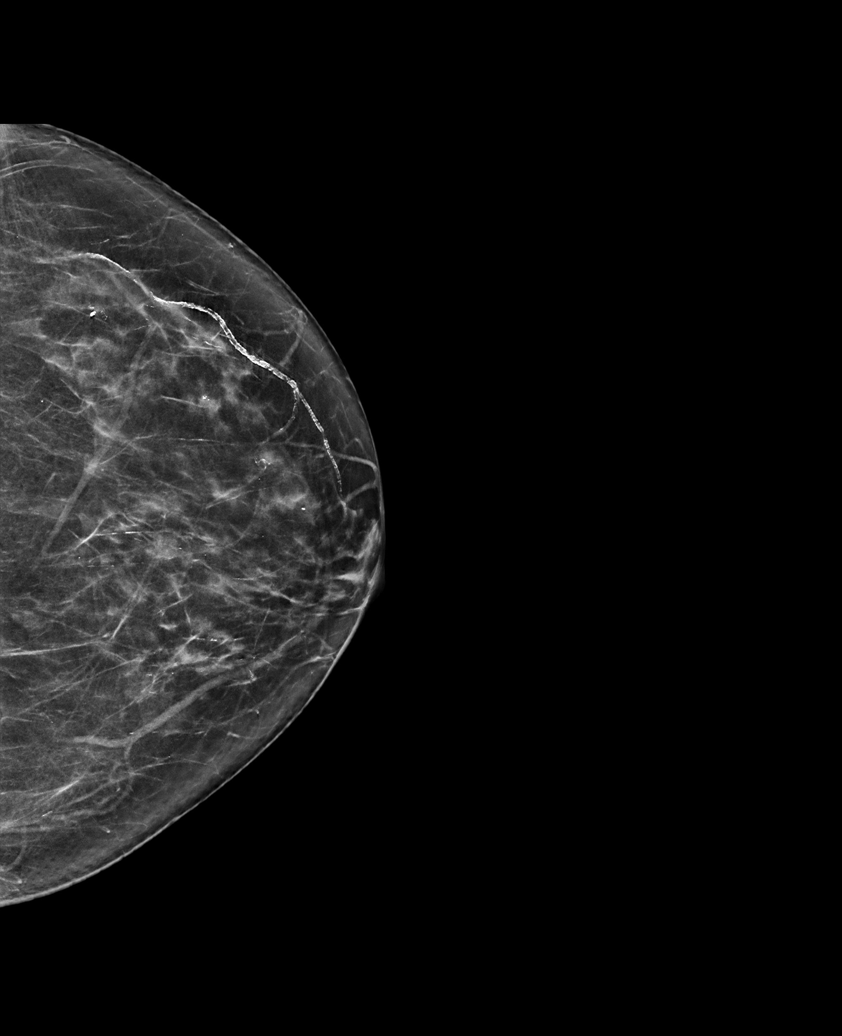

[L MLO synth-2D (2 of 2)]
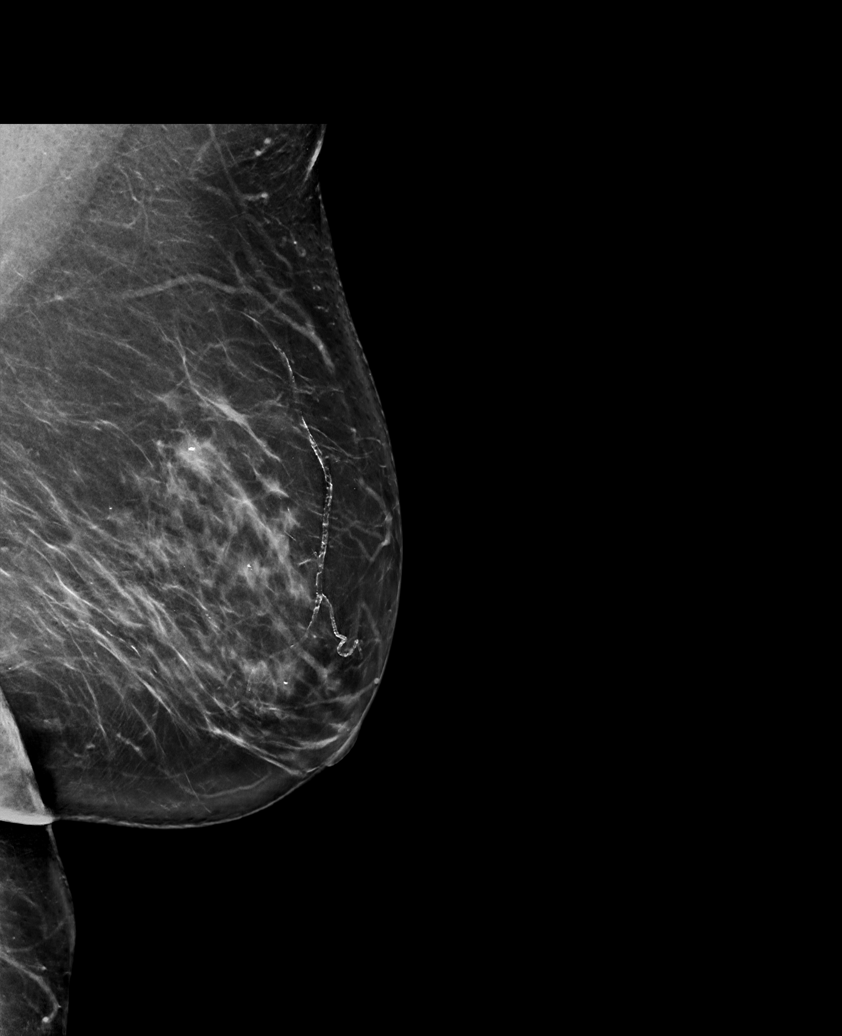

[R MLO synth-2D (1 of 2)]
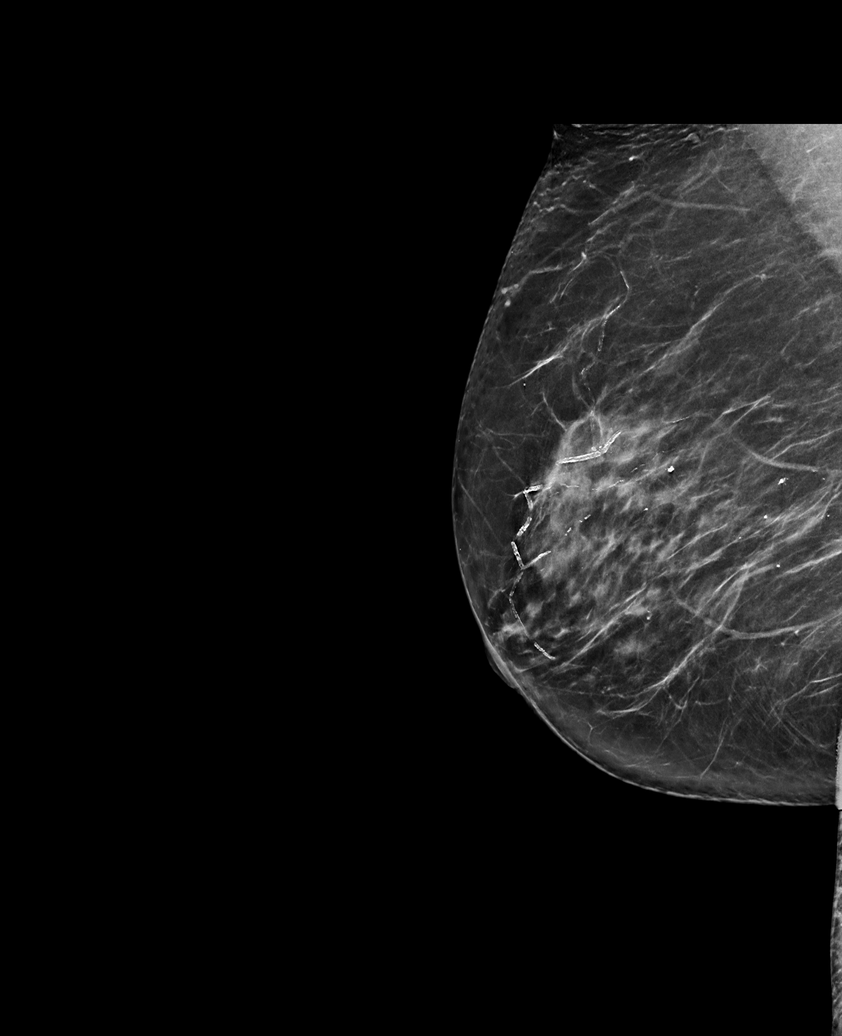

[R MLO synth-2D (2 of 2)]
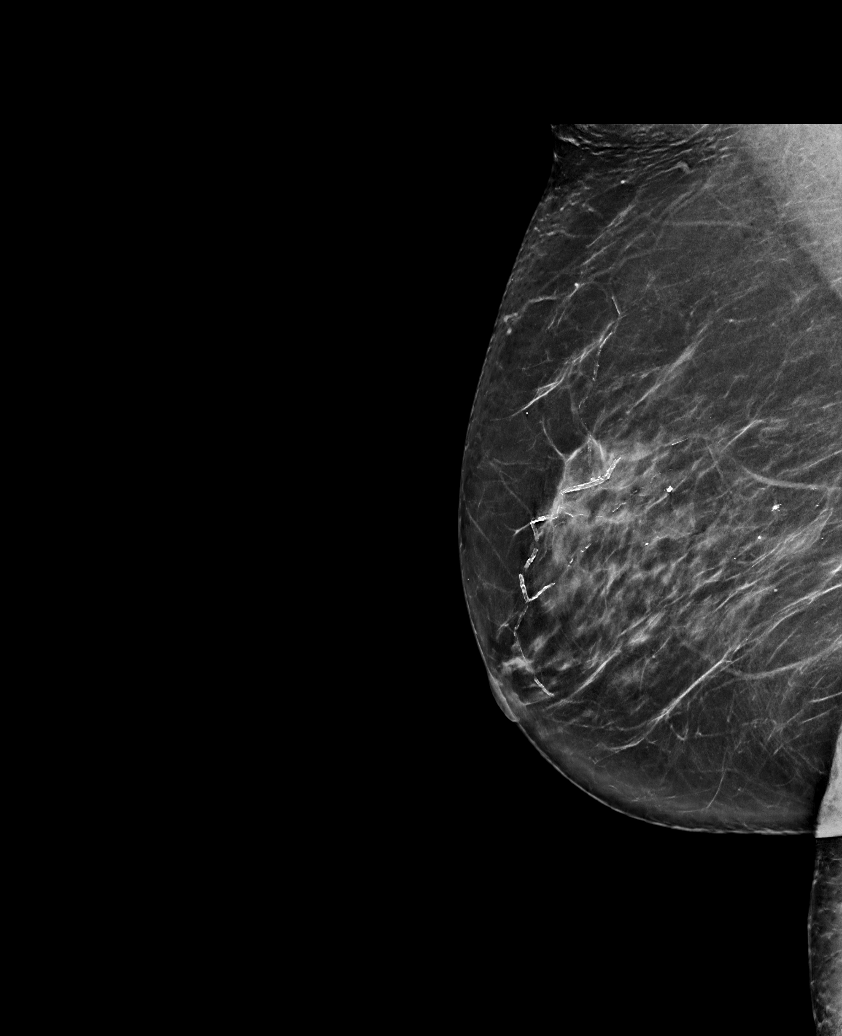

[6 of 36 positions shown; findings below may reference images not displayed]

ACR Breast Density Category c: The breast tissue is heterogeneously
dense, which may obscure small masses.
FINDINGS: There are no findings suspicious for malignancy. Images were
processed with CAD.
IMPRESSION: No mammographic evidence of malignancy. A result letter of this
screening mammogram will be mailed directly to the patient.

RECOMMENDATION:
Screening mammogram in one year. (Code:[id number])

BI-RADS CATEGORY  1: Negative.

## 2020-11-17 MED FILL — Glipizide Tab ER 24HR 10 MG: ORAL | 90 days supply | Qty: 90 | Fill #1 | Status: CN

## 2020-11-17 MED FILL — Amlodipine Besylate Tab 10 MG (Base Equivalent): ORAL | 90 days supply | Qty: 90 | Fill #1 | Status: CN

## 2020-11-18 ENCOUNTER — Other Ambulatory Visit (HOSPITAL_COMMUNITY): Payer: Self-pay

## 2020-11-19 ENCOUNTER — Other Ambulatory Visit (HOSPITAL_COMMUNITY): Payer: Self-pay

## 2020-11-19 MED FILL — Amlodipine Besylate Tab 10 MG (Base Equivalent): ORAL | 90 days supply | Qty: 90 | Fill #1 | Status: AC

## 2020-11-19 MED FILL — Glipizide Tab ER 24HR 10 MG: ORAL | 90 days supply | Qty: 90 | Fill #1 | Status: AC

## 2020-11-22 ENCOUNTER — Other Ambulatory Visit (HOSPITAL_COMMUNITY): Payer: Self-pay

## 2020-11-23 ENCOUNTER — Other Ambulatory Visit: Payer: Self-pay

## 2020-11-23 ENCOUNTER — Encounter: Payer: Self-pay | Admitting: Family Medicine

## 2020-11-23 DIAGNOSIS — M25561 Pain in right knee: Secondary | ICD-10-CM

## 2020-11-28 NOTE — Progress Notes (Signed)
Subjective:    CC: R knee pain  I, Molly Weber, LAT, ATC, am serving as scribe for Dr. Lynne Leader.  HPI: Pt is a 70 y/o female presenting w/ R knee pain since 10/04/20 when she injured her knee while stretching in bed.  She was previously seen by Dr. Barbaraann Barthel at [facility] on 11/07/20 but con't to have pain in her knee.  She has a hx of prior R knee meniscus tear and surgery in 2008 and has a hx of a Baker's cyst. Today, pt reports no improvement in her knee pain. Pt locates pain to the anterior aspect of R knee. 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   R knee swelling: yes- slight R knee mechanical symptoms: yes Aggravating factors: walking; weight-bearing activity; down stairs Treatments tried: Tylenol; Aleve; PT  Pertinent review of Systems: No fevers or chills  Relevant historical information:.  Diabetes.  A1c 7.4.   Objective:    Vitals:   11/29/20 0822  BP: (!) 152/92  Pulse: 95  SpO2: 100%   General: Well Developed, well nourished, and in no acute distress.   MSK: Right knee mild atrophy VMO otherwise normal-appearing Normal motion with crepitation. Tender palpation medial joint line. Recent laxity MCL stress test. Positive medial McMurray's test. Intact strength.    Lab and Radiology Results Diagnostic Limited MSK Ultrasound of: Right knee  Quad tendon intact normal-appearing Patellar tendon normal. Lateral joint line well spaced with lateral meniscus with possible partial tear although overall well-appearing Medial joint line.  Narrowed and degenerative appearing with partially absent medial meniscus with partial extrusion. Posterior knee moderate Baker's cyst. Impression: Degenerative medial joint line with degenerative medial meniscus with extrusion and moderate Baker's cyst.  X-ray images right knee obtained today personally and independently interpreted Moderate medial compartment DJD with mild to moderate  patellofemoral DJD.  No acute fractures. Await formal radiology review   Impression and Recommendations:    Assessment and Plan: 70 y.o. female with acute right knee pain occurring while stretching about 6 weeks ago.  Patient has a history of a partial medial meniscectomy.  Likely source of pain today is exacerbation of medial compartment DJD with degenerative medial meniscus tear.  Discussed treatment options.  Plan on compression knee sleeve Voltaren gel and continued oral NSAIDs.  Refer to physical therapy to work on quadricep strengthening.  Discussed possibility of steroid injection.  She would like to avoid steroid injection given her diabetes which I think is reasonable.  We will work on authorization for hyaluronic acid injections which could be helpful.  If not improved consider MRI.  She may be a candidate for arthroscopic surgery for meniscus debridement.  However she is also a candidate for hemiarthroplasty or total knee arthroplasty.  PDMP not reviewed this encounter. Orders Placed This Encounter  Procedures   [place] LIMITED JOINT SPACE STRUCTURES LOW RIGHT(NO LINKED CHARGES)    Standing Status:   Future    Number of Occurrences:   1    Standing Expiration Date:   05/29/2021    Order Specific Question:   Reason for Exam (SYMPTOM  OR DIAGNOSIS REQUIRED)    Answer:   right knee pain    Order Specific Question:   Preferred imaging location?    Answer:   [place]   DG Knee AP/LAT W/[place] Right    Standing Status:   Future    Number of Occurrences:   1  Standing Expiration Date:   11/29/2021    Order Specific Question:   Reason for Exam (SYMPTOM  OR DIAGNOSIS REQUIRED)    Answer:   right knee pain    Order Specific Question:   Preferred imaging location?    Answer:   [facility]   Ambulatory referral to Physical Therapy    Referral Priority:   Routine    Referral Type:   Physical Medicine    Referral Reason:   Specialty Services Required     Requested Specialty:   Physical Therapy    Number of Visits Requested:   1   No orders of the defined types were placed in this encounter.   Discussed warning signs or symptoms. Please see discharge instructions. Patient expresses understanding.   The above documentation has been reviewed and is accurate and complete Lynne Leader, M.D.

## 2020-11-29 ENCOUNTER — Ambulatory Visit (INDEPENDENT_AMBULATORY_CARE_PROVIDER_SITE_OTHER): Payer: Medicare Other

## 2020-11-29 ENCOUNTER — Other Ambulatory Visit: Payer: Self-pay

## 2020-11-29 ENCOUNTER — Ambulatory Visit: Payer: Medicare Other | Admitting: Family Medicine

## 2020-11-29 ENCOUNTER — Encounter: Payer: Self-pay | Admitting: Family Medicine

## 2020-11-29 ENCOUNTER — Ambulatory Visit: Payer: Self-pay

## 2020-11-29 VITALS — BP 152/92 | HR 95 | Ht 63.0 in | Wt 174.8 lb

## 2020-11-29 DIAGNOSIS — M25561 Pain in right knee: Secondary | ICD-10-CM

## 2020-11-29 NOTE — Patient Instructions (Addendum)
Thank you for coming in today.   Please get an Xray today before you leave   I've referred you to Physical Therapy.  Let us know if you don't hear from them in one week.   I recommend you obtained a compression sleeve to help with your joint problems. There are many options on the market however I recommend obtaining a full knee Body Helix compression sleeve.  You can find information (including how to appropriate measure yourself for sizing) can be found at www.Body http://www.lambert.com/.  Many of these products are health savings account (HSA) eligible.   You can use the compression sleeve at any time throughout the day but is most important to use while being active as well as for 2 hours post-activity.   It is appropriate to ice following activity with the compression sleeve in place.   I will get the process started for the gel shots.   If I get a surprising xray result we change our plan.  If all this does not help we can do a MRI.

## 2020-11-30 ENCOUNTER — Other Ambulatory Visit (HOSPITAL_COMMUNITY): Payer: Self-pay

## 2020-12-01 NOTE — Progress Notes (Signed)
Right knee x-ray shows arthritis changes present throughout the knee.

## 2020-12-10 ENCOUNTER — Other Ambulatory Visit: Payer: Self-pay | Admitting: Family Medicine

## 2020-12-12 ENCOUNTER — Other Ambulatory Visit (HOSPITAL_COMMUNITY): Payer: Self-pay

## 2020-12-12 MED ORDER — ONETOUCH ULTRA VI STRP
ORAL_STRIP | 2 refills | Status: DC
  Filled 2020-12-12 (×2): qty 100, 33d supply, fill #0

## 2020-12-13 ENCOUNTER — Other Ambulatory Visit (HOSPITAL_COMMUNITY): Payer: Self-pay

## 2020-12-22 ENCOUNTER — Other Ambulatory Visit (HOSPITAL_COMMUNITY): Payer: Self-pay

## 2020-12-22 ENCOUNTER — Other Ambulatory Visit: Payer: Self-pay | Admitting: Family Medicine

## 2020-12-22 MED FILL — Metformin HCl Tab 1000 MG: ORAL | 90 days supply | Qty: 225 | Fill #1 | Status: CN

## 2020-12-23 ENCOUNTER — Other Ambulatory Visit (HOSPITAL_COMMUNITY): Payer: Self-pay

## 2020-12-23 ENCOUNTER — Ambulatory Visit: Payer: Medicare Other | Admitting: Physical Therapy

## 2020-12-23 ENCOUNTER — Other Ambulatory Visit: Payer: Self-pay

## 2020-12-23 ENCOUNTER — Encounter: Payer: Self-pay | Admitting: Physical Therapy

## 2020-12-23 DIAGNOSIS — M25561 Pain in right knee: Secondary | ICD-10-CM | POA: Diagnosis not present

## 2020-12-23 DIAGNOSIS — M6281 Muscle weakness (generalized): Secondary | ICD-10-CM

## 2020-12-23 DIAGNOSIS — R262 Difficulty in walking, not elsewhere classified: Secondary | ICD-10-CM | POA: Diagnosis not present

## 2020-12-23 DIAGNOSIS — R600 Localized edema: Secondary | ICD-10-CM | POA: Diagnosis not present

## 2020-12-23 NOTE — Patient Instructions (Signed)
Access Code: [email or web address] URL: [email or web address] Date: 12/23/2020 Prepared by: Elsie Ra  Exercises Supine Quadriceps Stretch with Strap on Table - 2 x daily - 6 x weekly - 3 reps - 30 hold Seated Straight Leg Heel Taps - 2 x daily - 6 x weekly - 3 sets - 10 reps Gastroc Stretch on Wall - 1 x daily - 3 x weekly - 3 reps - 1 sets - 30 hold Sit to Stand Without Arm Support - 2 x daily - 6 x weekly - 1-2 sets - 10 reps

## 2020-12-23 NOTE — Therapy (Signed)
[facility] Physical Therapy [address] [place], [place], [address] Phone: [phone]   Fax:  [phone]  Physical Therapy Evaluation  Patient Details  Name: Alexandra Fletcher MRN: [id number] Date of Birth: 1950/04/29 Referring Provider (PT): Georgina Snell   Encounter Date: 12/23/2020   PT End of Session - 12/23/20 1128     Visit Number 1    Number of Visits 6    Date for PT Re-Evaluation 02/03/21    Authorization Type UHC MCR    Progress Note Due on Visit 10    PT Start Time 0800    PT Stop Time 0850    PT Time Calculation (min) 50 min    Activity Tolerance Patient tolerated treatment well    Behavior During Therapy [facility] for tasks assessed/performed             Past Medical History:  Diagnosis Date   Allergy    Arthritis    Cataract    mild x both eyes    Cerebral thrombosis with cerebral infarction [facility]) 2002   diagnosis needs confirmed by review of [place] records   Diabetes mellitus    Heart murmur    1x per Dr. Linna Darner- not recurrent   Hyperlipidemia    Hypertension    Partial rupture of Achilles tendon    After fluorquinolone use    Past Surgical History:  Procedure Laterality Date   CATARACT EXTRACTION, BILATERAL     COLONOSCOPY  2008   neg   DILATION AND CURETTAGE OF UTERUS     X2   KNEE ARTHROSCOPY W/ MENISCAL REPAIR  2008    Dr Adella Nissen knee   Ovarian cysts removed      Dr Warnell Forester   POLYPECTOMY  2003   polypoid colonic mucosa/ 2008 no polyps   THUMB SURGERY  1983   Left; post skiing accident in [place]    There were no vitals filed for this visit.    Subjective Assessment - 12/23/20 1112     Subjective R knee pain since 10/04/20 when she injured her knee while stretching into extension in bed.She has a hx of prior R knee meniscus tear and surgery in 2008 and has a hx of a Baker's cyst. 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 Pain is localized to medial knee.     Diagnostic tests Imaging: US Impression: "Degenerative medial joint line with degenerative medial meniscus with extrusion and moderate Baker's cyst." Knee XR "Moderate medial compartment DJD with mild to moderate patellofemoral DJD"    Patient Stated Goals reduce pain    Currently in Pain? Yes    Pain Score 6    with walking, does not bother her at this moment with rest   Pain Location Knee    Pain Orientation Right    Pain Descriptors / Indicators Aching    Pain Type Acute pain    Pain Radiating Towards denies N/T    Pain Onset More than a month ago    Pain Frequency Intermittent    Aggravating Factors  standing, walking, squatting, extending her knee    Pain Relieving Factors tyelonol, rest                [facility] PT Assessment - 12/23/20 0001       Assessment   Medical Diagnosis Rt knee pain    Referring Provider (PT) Georgina Snell    Onset Date/Surgical Date --   date of injury 09/28/20  Prior Therapy 2 years ago had PT for her Rt knee      Precautions   Precautions None      Restrictions   Weight Bearing Restrictions No      Balance Screen   Has the patient fallen in the past 6 months No    Has the patient had a decrease in activity level because of a fear of falling?  No    Is the patient reluctant to leave their home because of a fear of falling?  No      Home Ecologist residence      Prior Function   Level of Independence Independent    Vocation Full time employment    Vocation Requirements swim school instructor, she is constantly in the water    Leisure be active      Cognition   Overall Cognitive Status Within Functional Limits for tasks assessed      Observation/Other Assessments   Observations mild quad atrophy on Rt, Rt knee varus    Focus on Therapeutic Outcomes (FOTO)  55% funcitonal, predicted goal is 68      Observation/Other Assessments-Edema    Edema --   moderate edema Rt medial knee     Functional Tests    Functional tests Squat;Single leg stance      Squat   Comments decreased ROM and decreased ankle DF noted,      Single Leg Stance   Comments able to hold 15 sec bilat, mild instability      ROM / Strength   AROM / PROM / Strength Strength;PROM;AROM      AROM   Overall AROM Comments Rt knee 0-130, left knee 0-140      Strength   Strength Assessment Site Hip;Knee    Right/Left Hip Right;Left    Right Hip Flexion --   18.6 lbs   Right Hip ABduction --   18 lbs   Right Hip ADduction 4+/5    Left Hip Flexion --   18.7   Left Hip ABduction --   18 lbs   Left Hip ADduction 4+/5    Right/Left Knee Right;Left    Right Knee Flexion --   44 lbs   Right Knee Extension --   44 lbs   Left Knee Flexion --   44 lbs   Left Knee Extension --   57 lbs     Palpation   Patella mobility good patella mobility with normal tracking      Special Tests   Other special tests does not report significant pain with mcmurrays test or eges test      Ambulation/Gait   Gait Comments independent community ambulator, increased Rt knee varus                        Objective measurements completed on examination: See above findings.       [facility] Adult PT Treatment/Exercise - 12/23/20 0001       Modalities   Modalities Iontophoresis      Iontophoresis   Type of Iontophoresis Dexamethasone    Location --   Rt medial knee   Dose 1.0 CC    Time 4-6 hour wear home patch                     PT Education - 12/23/20 1127     Education Details HEP,POC,exam findings, knee anatomy, XR review    Person(s)  Educated Patient    Methods Explanation;Demonstration;Verbal cues;Handout    Comprehension Verbalized understanding;Need further instruction              PT Short Term Goals - 12/23/20 1135       PT SHORT TERM GOAL #1   Title Pt will be I and compliant with HEP.    Time 4    Period Weeks    Status New               PT Long Term Goals - 12/23/20 1135        PT LONG TERM GOAL #1   Title Pt will improve FOTO score to predicted goal of 68% funcitonal    Time 6    Period Weeks    Status New      PT LONG TERM GOAL #2   Title Pt will improve Rt quad strength to 5/5 and at least 55 lbs of force on HHD to show improved functional strength    Time 6    Period Weeks    Status New      PT LONG TERM GOAL #3   Title Pt will improve Rt knee flexion ROM to at least 135 degrees for functional mobility    Time 6    Period Weeks    Status New      PT LONG TERM GOAL #4   Title Pt will reduce overall pain to less than 3/10 with walking, stairs, standing, squatting, ADL's    Time 6    Period Weeks    Status New                    Plan - 12/23/20 1130     Clinical Impression Statement Pt presents with signs and symptoms consistent of Rt knee pain, OA, and medial meniscal injury. She has overall decreased ROM, decreased quad strength,  decreased activity tolerance particularly with standing or walking, and increased pain limiting her functional abilities. She will benefit from skilled PT to address her deficits.    Personal Factors and Comorbidities Past/Current Experience;Comorbidity 2    Comorbidities DM,OA,previous knee scope with meniscal repair 2008    Examination-Activity Limitations Bend;Squat;Stairs;Lift;Stand;Locomotion Level    Examination-Participation Restrictions Cleaning;Community Activity;Yard Work;Shop;Occupation    Stability/Clinical Decision Making Stable/Uncomplicated    Clinical Decision Making Low    Rehab Potential Good    PT Frequency 1x / week    PT Duration 6 weeks    PT Treatment/Interventions ADLs/Self Care Home Management;Cryotherapy;Electrical Stimulation;Iontophoresis 4mg /ml Dexamethasone;Moist Heat;Ultrasound;Gait training;Therapeutic activities;Therapeutic exercise;Neuromuscular re-education;Manual techniques;Passive range of motion;Dry needling;Joint Manipulations;Vasopneumatic Device;Taping    PT Next Visit  Plan review and update HEP PRN, how was Ionto?, needs quad strength and ankle DF ROM    PT Home Exercise Plan Access Code: [email or web address]    Consulted and Agree with Plan of Care Patient             Patient will benefit from skilled therapeutic intervention in order to improve the following deficits and impairments:  Decreased activity tolerance, Decreased range of motion, Decreased strength, Difficulty walking, Impaired flexibility, Pain  Visit Diagnosis: Acute pain of right knee  Muscle weakness (generalized)  Difficulty in walking, not elsewhere classified  Localized edema     Problem List Patient Active Problem List   Diagnosis Date Noted   Type 2 diabetes mellitus with peripheral vascular disease ([place]) 07/07/2019   Posterior tibial tendinitis of right lower extremity 04/01/2018   Dysphagia, pharyngoesophageal phase 02/21/2015  Diabetic retinopathy ([place]) 12/19/2014   History of CVA - Acute right arterial ischemic stroke, PCA (posterior cerebral artery) 01/04/2013   Eosinophilia 12/03/2011   Sinus tarsi syndrome 01/31/2011   Baker's cyst of knee 10/10/2010   Hyperlipidemia associated with type 2 diabetes mellitus ([place]) 08/16/2009   Hypertension associated with diabetes ([place]) 08/16/2009   History of colonic polyps 08/16/2009   HEART MURMUR, BENIGN 05/16/2007   History of abnormal cervical Pap smear 04/21/2007   Type 2 diabetes mellitus with ophthalmic complication ([place]) 76/72/0947    Debbe Odea, PT,DPT 12/23/2020, 11:39 AM  [facility] Physical Therapy [address] [place], [place], [address] Phone: [phone]   Fax:  [phone]  Name: Alexandra Fletcher MRN: [id number] Date of Birth: 1950-12-23

## 2020-12-26 ENCOUNTER — Encounter: Payer: Self-pay | Admitting: Family Medicine

## 2020-12-27 ENCOUNTER — Other Ambulatory Visit (HOSPITAL_COMMUNITY): Payer: Self-pay

## 2020-12-27 ENCOUNTER — Other Ambulatory Visit: Payer: Self-pay

## 2020-12-27 ENCOUNTER — Ambulatory Visit: Payer: Medicare Other | Admitting: Rehabilitative and Restorative Service Providers"

## 2020-12-27 ENCOUNTER — Encounter: Payer: Self-pay | Admitting: Rehabilitative and Restorative Service Providers"

## 2020-12-27 DIAGNOSIS — R262 Difficulty in walking, not elsewhere classified: Secondary | ICD-10-CM

## 2020-12-27 DIAGNOSIS — M6281 Muscle weakness (generalized): Secondary | ICD-10-CM

## 2020-12-27 DIAGNOSIS — M25561 Pain in right knee: Secondary | ICD-10-CM

## 2020-12-27 DIAGNOSIS — R600 Localized edema: Secondary | ICD-10-CM | POA: Diagnosis not present

## 2020-12-27 MED ORDER — CARVEDILOL 6.25 MG PO TABS
6.2500 mg | ORAL_TABLET | Freq: Two times a day (BID) | ORAL | 0 refills | Status: DC
  Filled 2020-12-27 – 2020-12-31 (×2): qty 180, 90d supply, fill #0

## 2020-12-27 NOTE — Therapy (Signed)
[facility] Physical Therapy [address] [place], [place], [address] Phone: [phone]   Fax:  [phone]  Physical Therapy Treatment  Patient Details  Name: Alexandra Fletcher MRN: [id number] Date of Birth: 05-26-1950 Referring Provider (PT): Georgina Snell   Encounter Date: 12/27/2020   PT End of Session - 12/27/20 0842     Visit Number 2    Number of Visits 6    Date for PT Re-Evaluation 02/03/21    Authorization Type UHC MCR    Progress Note Due on Visit 10    PT Start Time 4008    PT Stop Time 0923    PT Time Calculation (min) 39 min    Activity Tolerance Patient tolerated treatment well    Behavior During Therapy [facility] for tasks assessed/performed             Past Medical History:  Diagnosis Date   Allergy    Arthritis    Cataract    mild x both eyes    Cerebral thrombosis with cerebral infarction [facility]) 2002   diagnosis needs confirmed by review of [facility] records   Diabetes mellitus    Heart murmur    1x per Dr. Linna Darner- not recurrent   Hyperlipidemia    Hypertension    Partial rupture of Achilles tendon    After fluorquinolone use    Past Surgical History:  Procedure Laterality Date   CATARACT EXTRACTION, BILATERAL     COLONOSCOPY  2008   neg   DILATION AND CURETTAGE OF UTERUS     X2   KNEE ARTHROSCOPY W/ MENISCAL REPAIR  2008    Dr Adella Nissen knee   Ovarian cysts removed      Dr Warnell Forester   POLYPECTOMY  2003   polypoid colonic mucosa/ 2008 no polyps   THUMB SURGERY  1983   Left; post skiing accident in [place]    There were no vitals filed for this visit.   Subjective Assessment - 12/27/20 0844     Subjective Pt. indicated no pain upon arrival today.  She reported that she has less sharp/shooting pain in weekend.    Diagnostic tests Imaging: US Impression: "Degenerative medial joint line with degenerative medial meniscus with extrusion and moderate Baker's cyst." Knee XR "Moderate medial compartment DJD with mild to  moderate patellofemoral DJD"    Patient Stated Goals reduce pain    Currently in Pain? No/denies    Pain Score 0-No pain    Pain Onset More than a month ago                               [facility] Adult PT Treatment/Exercise - 12/27/20 0001       Exercises   Exercises Knee/Hip;Other Exercises      Knee/Hip Exercises: Stretches   Gastroc Stretch 30 seconds;Both;4 reps   incilne board   Other Knee/Hip Stretches supine thomas quad/hip flexor stretch 30 sec x 3 Rt c cues for home use      Knee/Hip Exercises: Aerobic   Recumbent Bike Lvl 3 5 mins      Knee/Hip Exercises: Seated   Other Seated Knee/Hip Exercises seated SLR Rt 2 x 10 c cues for home use    Sit to Sand without UE support;2 sets;10 reps   18 inch chair, fast up, slow down     Iontophoresis   Type of Iontophoresis Dexamethasone    Location Rt medial knee    Dose  1.0 CC    Time 4-6 hour wear home patch                       PT Short Term Goals - 12/27/20 0923       PT SHORT TERM GOAL #1   Title Pt will be I and compliant with HEP.    Time 4    Period Weeks    Status On-going               PT Long Term Goals - 12/23/20 1135       PT LONG TERM GOAL #1   Title Pt will improve FOTO score to predicted goal of 68% funcitonal    Time 6    Period Weeks    Status New      PT LONG TERM GOAL #2   Title Pt will improve Rt quad strength to 5/5 and at least 55 lbs of force on HHD to show improved functional strength    Time 6    Period Weeks    Status New      PT LONG TERM GOAL #3   Title Pt will improve Rt knee flexion ROM to at least 135 degrees for functional mobility    Time 6    Period Weeks    Status New      PT LONG TERM GOAL #4   Title Pt will reduce overall pain to less than 3/10 with walking, stairs, standing, squatting, ADL's    Time 6    Period Weeks    Status New                   Plan - 12/27/20 0907     Clinical Impression Statement Additional  time spent today to review existing HEP and improve techniques as well as emphasize importance of routine use and building strength in Rt leg to improve function and symptoms.    Personal Factors and Comorbidities Past/Current Experience;Comorbidity 2    Comorbidities DM,OA,previous knee scope with meniscal repair 2008    Examination-Activity Limitations Bend;Squat;Stairs;Lift;Stand;Locomotion Level    Examination-Participation Restrictions Cleaning;Community Activity;Yard Work;Shop;Occupation    Stability/Clinical Decision Making Stable/Uncomplicated    Rehab Potential Good    PT Frequency 1x / week    PT Duration 6 weeks    PT Treatment/Interventions ADLs/Self Care Home Management;Cryotherapy;Electrical Stimulation;Iontophoresis 4mg /ml Dexamethasone;Moist Heat;Ultrasound;Gait training;Therapeutic activities;Therapeutic exercise;Neuromuscular re-education;Manual techniques;Passive range of motion;Dry needling;Joint Manipulations;Vasopneumatic Device;Taping    PT Next Visit Plan Ionto 1-2x more time possible, continued progressive quad strengthening in WB, NWB activity.    PT Home Exercise Plan Access Code: [email or web address]    Consulted and Agree with Plan of Care Patient             Patient will benefit from skilled therapeutic intervention in order to improve the following deficits and impairments:  Decreased activity tolerance, Decreased range of motion, Decreased strength, Difficulty walking, Impaired flexibility, Pain  Visit Diagnosis: Acute pain of right knee  Muscle weakness (generalized)  Difficulty in walking, not elsewhere classified  Localized edema     Problem List Patient Active Problem List   Diagnosis Date Noted   Type 2 diabetes mellitus with peripheral vascular disease ([place]) 07/07/2019   Posterior tibial tendinitis of right lower extremity 04/01/2018   Dysphagia, pharyngoesophageal phase 02/21/2015   Diabetic retinopathy ([place]) 12/19/2014   History of CVA - Acute  right arterial ischemic stroke, PCA (posterior cerebral artery) 01/04/2013   Eosinophilia 12/03/2011  Sinus tarsi syndrome 01/31/2011   Baker's cyst of knee 10/10/2010   Hyperlipidemia associated with type 2 diabetes mellitus ([place]) 08/16/2009   Hypertension associated with diabetes ([place]) 08/16/2009   History of colonic polyps 08/16/2009   HEART MURMUR, BENIGN 05/16/2007   History of abnormal cervical Pap smear 04/21/2007   Type 2 diabetes mellitus with ophthalmic complication ([place]) 01/65/8006    Scot Jun, PT, DPT, OCS, ATC 12/27/20  9:23 AM    [facility] Physical Therapy [address] [place], [place], [address] Phone: [phone]   Fax:  [phone]  Name: Jalayla Chrismer MRN: [id number] Date of Birth: 1950-04-21

## 2020-12-30 DIAGNOSIS — Z1231 Encounter for screening mammogram for malignant neoplasm of breast: Secondary | ICD-10-CM | POA: Diagnosis not present

## 2020-12-30 LAB — HM MAMMOGRAPHY

## 2020-12-31 ENCOUNTER — Other Ambulatory Visit: Payer: Self-pay

## 2020-12-31 ENCOUNTER — Other Ambulatory Visit (HOSPITAL_COMMUNITY): Payer: Self-pay

## 2020-12-31 ENCOUNTER — Ambulatory Visit (HOSPITAL_COMMUNITY)
Admission: EM | Admit: 2020-12-31 | Discharge: 2020-12-31 | Disposition: A | Discharge status: Home / Self Care | Payer: Medicare Other | Attending: Emergency Medicine | Admitting: Emergency Medicine

## 2020-12-31 ENCOUNTER — Encounter (HOSPITAL_COMMUNITY): Payer: Self-pay

## 2020-12-31 ENCOUNTER — Telehealth (HOSPITAL_COMMUNITY): Payer: Self-pay | Admitting: Emergency Medicine

## 2020-12-31 DIAGNOSIS — U071 COVID-19: Secondary | ICD-10-CM | POA: Diagnosis not present

## 2020-12-31 MED ORDER — ONDANSETRON 4 MG PO TBDP: 4.0000 mg | ORAL_TABLET | Freq: Three times a day (TID) | ORAL | 0 refills | Status: DC | PRN

## 2020-12-31 MED ORDER — NIRMATRELVIR/RITONAVIR (PAXLOVID)TABLET
3.0000 | ORAL_TABLET | Freq: Two times a day (BID) | ORAL | 0 refills | Status: DC
  Filled 2020-12-31: qty 30, 5d supply, fill #0

## 2020-12-31 MED ORDER — NIRMATRELVIR/RITONAVIR (PAXLOVID)TABLET
3.0000 | ORAL_TABLET | Freq: Two times a day (BID) | ORAL | 0 refills | Status: AC
  Filled 2020-12-31: qty 30, 5d supply, fill #0

## 2020-12-31 NOTE — Discharge Instructions (Signed)
Take the Paxlovid twice a day for the next 5 days.   Stop your atorvastatin for the next week.   You can take Tylenol and/or Ibuprofen as needed for fever reduction and pain relief.   For cough: honey 1/2 to 1 teaspoon (you can dilute the honey in water or another fluid).  You can also use guaifenesin and dextromethorphan for cough. You can use a humidifier for chest congestion and cough.  If you don't have a humidifier, you can sit in the bathroom with the hot shower running.    For sore throat: try warm salt water gargles, cepacol lozenges, throat spray, warm tea or water with lemon/honey, popsicles or ice, or OTC cold relief medicine for throat discomfort.  For congestion: take a daily anti-histamine like Zyrtec, Claritin, and a oral decongestant, such as pseudoephedrine.  You can also use Flonase 1-2 sprays in each nostril daily.    It is important to stay hydrated: drink plenty of fluids (water, gatorade/powerade/pedialyte, juices, or teas)   Return or go to the Emergency Department if symptoms worsen or do not improve in the next few days.

## 2020-12-31 NOTE — ED Provider Notes (Signed)
[facility]    CSN: [id number] Arrival date & time: 12/31/20  1004      History   Chief Complaint Chief Complaint  Patient presents with   Covid +    HPI Alexandra Fletcher is a 70 y.o. female.   Patient here for evaluation after testing positive for COVID this morning.  Reports having fever, cough, and generalized body aches that started yesterday.  Reports taking Tylenol with last dose at 9 AM.  Denies any trauma, injury, or other precipitating event.  Denies any specific alleviating or aggravating factors.  Denies any fevers, chest pain, shortness of breath, N/V/D, numbness, tingling, weakness, abdominal pain, or headaches.    The history is provided by the patient and the spouse.   Past Medical History:  Diagnosis Date   Allergy    Arthritis    Cataract    mild x both eyes    Cerebral thrombosis with cerebral infarction [facility]) 2002   diagnosis needs confirmed by review of [place] records   Diabetes mellitus    Heart murmur    1x per Dr. Linna Darner- not recurrent   Hyperlipidemia    Hypertension    Partial rupture of Achilles tendon    After fluorquinolone use    Patient Active Problem List   Diagnosis Date Noted   Type 2 diabetes mellitus with peripheral vascular disease ([place]) 07/07/2019   Posterior tibial tendinitis of right lower extremity 04/01/2018   Dysphagia, pharyngoesophageal phase 02/21/2015   Diabetic retinopathy ([place]) 12/19/2014   History of CVA - Acute right arterial ischemic stroke, PCA (posterior cerebral artery) 01/04/2013   Eosinophilia 12/03/2011   Sinus tarsi syndrome 01/31/2011   Baker's cyst of knee 10/10/2010   Hyperlipidemia associated with type 2 diabetes mellitus ([place]) 08/16/2009   Hypertension associated with diabetes ([place]) 08/16/2009   History of colonic polyps 08/16/2009   HEART MURMUR, BENIGN 05/16/2007   History of abnormal cervical Pap smear 04/21/2007   Type 2 diabetes mellitus with ophthalmic complication  ([place]) 56/31/4970    Past Surgical History:  Procedure Laterality Date   CATARACT EXTRACTION, BILATERAL     COLONOSCOPY  2008   neg   DILATION AND CURETTAGE OF UTERUS     X2   KNEE ARTHROSCOPY W/ MENISCAL REPAIR  2008    Dr Adella Nissen knee   Ovarian cysts removed      Dr Warnell Forester   POLYPECTOMY  2003   polypoid colonic mucosa/ 2008 no polyps   THUMB SURGERY  1983   Left; post skiing accident in [place]    OB History     Gravida  3   Para  2   Term  2   Preterm      AB  1   Living  2      SAB  1   IAB      Ectopic      Multiple      Live Births               Home Medications    Prior to Admission medications   Medication Sig Start Date End Date Taking? Authorizing Provider  amLODipine (NORVASC) 10 MG tablet TAKE 1 TABLET (10 MG TOTAL) BY MOUTH DAILY. 05/31/20 05/31/21  Marin Olp, MD  aspirin EC 81 MG tablet Take 81 mg by mouth daily.    [provider]  atorvastatin (LIPITOR) 20 MG tablet Take 1 tablet by mouth 2 times a week. 09/13/20   Garret Reddish  O, MD  Blood Glucose Monitoring Suppl (ONE TOUCH ULTRA 2) w/Device KIT USE TO TEST BLOOD SUGAR TWO TO THREE TIMES DAILY 11/19/18   Marin Olp, MD  carvedilol (COREG) 6.25 MG tablet Take 1 tablet (6.25 mg total) by mouth 2 (two) times daily. 12/27/20   Marin Olp, MD  glipiZIDE (GLUCOTROL XL) 10 MG 24 hr tablet TAKE 1 TABLET BY MOUTH EVERY DAY 05/30/20 05/30/21  Marin Olp, MD  glucose blood (ONETOUCH ULTRA) test strip USE TO TEST BLOOD GLUCOSE 2 TO 3 TIMES A DAY 12/12/20 12/09/21  Marin Olp, MD  metFORMIN (GLUCOPHAGE) 1000 MG tablet TAKE 1 TABLET BY MOUTH IN THE MORNING, 1/2 TABLET AT LUNCH AND 1 TABLET AT DINNER. 01/12/20 01/11/21  Marin Olp, MD  nirmatrelvir/ritonavir EUA (PAXLOVID) 20 x 150 MG & 10 x 100MG TABS Take 3 tablets by mouth 2 (two) times daily for 5 days. Patient GFR is 65. Take nirmatrelvir (150 mg) two tablets twice daily for 5 days and ritonavir  (100 mg) one tablet twice daily for 5 days. 12/31/20 01/05/21  Pearson Forster, NP  OneTouch Delica Lancets 01S MISC USE TO TEST BLOOD SUGAR TWO TO THREE TIMES DAILY 11/19/18   Marin Olp, MD  sitaGLIPtin (JANUVIA) 100 MG tablet Take 1 tablet (100 mg total) by mouth daily. 08/19/20   Marin Olp, MD  vitamin B-12 (CYANOCOBALAMIN) 100 MCG tablet     [provider]    Family History Family History  Problem Relation Age of Onset   Liver disease Mother        ? malignancy after Hepatitis   Diabetes Father    Heart attack Father        CBAG @ 3   Multiple myeloma Father    Hyperlipidemia Father    Hypertension Father    Diabetes Brother        Type 1 DM   Heart attack Brother 79   Stroke Maternal Aunt        > 71   Breast cancer Cousin    Other Sister        died as child from pneumonia   Healthy Brother    Colon cancer Neg Hx    Colon polyps Neg Hx    Esophageal cancer Neg Hx    Rectal cancer Neg Hx    Stomach cancer Neg Hx     Social History Social History   Tobacco Use   Smoking status: Never   Smokeless tobacco: Never  Substance Use Topics   Alcohol use: Yes    Alcohol/week: 7.0 standard drinks    Types: 7 Glasses of wine per week   Drug use: No     Allergies   Lisinopril, Moxifloxacin, and Pioglitazone   Review of Systems Review of Systems  Constitutional:  Positive for fatigue and fever.  HENT:  Positive for congestion.   Musculoskeletal:  Positive for myalgias.  All other systems reviewed and are negative.   Physical Exam Triage Vital Signs ED Triage Vitals  Enc Vitals Group     BP 12/31/20 1039 (!) 176/84     Pulse Rate 12/31/20 1039 (!) 114     Resp 12/31/20 1039 20     Temp 12/31/20 1039 (!) 100.5 F (38.1 C)     Temp Source 12/31/20 1039 Oral     SpO2 12/31/20 1039 94 %     Weight --      Height --  Head Circumference --      Peak Flow --      Pain Score 12/31/20 1046 6     Pain Loc --      Pain Edu? --       Excl. in [place]? --    No data found.  Updated Vital Signs BP (!) 176/84 (BP Location: Left Arm)   Pulse (!) 114   Temp (!) 100.5 F (38.1 C) (Oral)   Resp 20   LMP  (LMP Unknown)   SpO2 94%   Visual Acuity Right Eye Distance:   Left Eye Distance:   Bilateral Distance:    Right Eye Near:   Left Eye Near:    Bilateral Near:     Physical Exam Vitals and nursing note reviewed.  Constitutional:      General: She is not in acute distress.    Appearance: Normal appearance. She is not ill-appearing, toxic-appearing or diaphoretic.  HENT:     Head: Normocephalic and atraumatic.     Nose: Congestion present. No rhinorrhea.     Mouth/Throat:     Mouth: Mucous membranes are moist.     Pharynx: Oropharynx is clear. No oropharyngeal exudate or posterior oropharyngeal erythema.  Eyes:     Conjunctiva/sclera: Conjunctivae normal.  Cardiovascular:     Rate and Rhythm: Normal rate and regular rhythm.     Pulses: Normal pulses.     Heart sounds: Normal heart sounds.  Pulmonary:     Effort: Pulmonary effort is normal.     Breath sounds: Normal breath sounds.  Abdominal:     General: Abdomen is flat.  Musculoskeletal:        General: Normal range of motion.     Cervical back: Normal range of motion.  Skin:    General: Skin is warm and dry.  Neurological:     General: No focal deficit present.     Mental Status: She is alert and oriented to person, place, and time.  Psychiatric:        Mood and Affect: Mood normal.     UC Treatments / Results  Labs (all labs ordered are listed, but only abnormal results are displayed) Labs Reviewed - No data to display  EKG   Radiology No results found.  Procedures Procedures (including critical care time)  Medications Ordered in UC Medications - No data to display  Initial Impression / Assessment and Plan / UC Course  I have reviewed the triage vital signs and the nursing notes.  Pertinent labs & imaging results that were  available during my care of the patient were reviewed by me and considered in my medical decision making (see chart for details).    Assessment negative for red flags or concerns.  As home COVID test was positive additional COVID testing is not needed at this time.  Symptoms started yesterday to patient is within the timeframe for antiviral treatment.  Discussed risk and benefit of antivirals versus symptom management.  Patient wanting antiviral treatment at this time.  Kidney function in April was normal with a GFR of greater than 60 so patient is a candidate for Paxlovid.  She does take atorvastatin twice a week and will need to hold that for the next week while taking antivirals.  Tylenol and or ibuprofen as needed.  Discussed conservative symptom management as described in discharge instructions.  Encourage fluids and rest.  Follow-up as needed Final Clinical Impressions(s) / UC Diagnoses   Final diagnoses:  [id number]  Discharge Instructions      Take the Paxlovid twice a day for the next 5 days.   Stop your atorvastatin for the next week.   You can take Tylenol and/or Ibuprofen as needed for fever reduction and pain relief.   For cough: honey 1/2 to 1 teaspoon (you can dilute the honey in water or another fluid).  You can also use guaifenesin and dextromethorphan for cough. You can use a humidifier for chest congestion and cough.  If you don't have a humidifier, you can sit in the bathroom with the hot shower running.    For sore throat: try warm salt water gargles, cepacol lozenges, throat spray, warm tea or water with lemon/honey, popsicles or ice, or OTC cold relief medicine for throat discomfort.  For congestion: take a daily anti-histamine like Zyrtec, Claritin, and a oral decongestant, such as pseudoephedrine.  You can also use Flonase 1-2 sprays in each nostril daily.    It is important to stay hydrated: drink plenty of fluids (water, gatorade/powerade/pedialyte, juices, or  teas)   Return or go to the Emergency Department if symptoms worsen or do not improve in the next few days.      ED Prescriptions     Medication Sig Dispense Auth. Provider   nirmatrelvir/ritonavir EUA (PAXLOVID) 20 x 150 MG & 10 x 100MG TABS  (Status: Discontinued) Take 3 tablets by mouth 2 (two) times daily for 5 days. Patient GFR is 65. Take nirmatrelvir (150 mg) two tablets twice daily for 5 days and ritonavir (100 mg) one tablet twice daily for 5 days. 30 tablet Pearson Forster, NP   nirmatrelvir/ritonavir EUA (PAXLOVID) 20 x 150 MG & 10 x 100MG TABS Take 3 tablets by mouth 2 (two) times daily for 5 days. Patient GFR is 65. Take nirmatrelvir (150 mg) two tablets twice daily for 5 days and ritonavir (100 mg) one tablet twice daily for 5 days. 30 tablet Pearson Forster, NP      PDMP not reviewed this encounter.   Pearson Forster, NP 12/31/20 1134

## 2020-12-31 NOTE — Telephone Encounter (Signed)
Patient reports vomiting after first dose of paxlovid.  Zofran sent into [facility] for nausea and vomiting.

## 2020-12-31 NOTE — ED Triage Notes (Signed)
Pt presents covid positive as of this morning; Pt complains of fever and generalized body aches.  Pt took tylenol at 9 am before arrival.

## 2021-01-02 ENCOUNTER — Other Ambulatory Visit (HOSPITAL_COMMUNITY): Payer: Self-pay

## 2021-01-02 ENCOUNTER — Telehealth: Payer: Self-pay | Admitting: Pharmacist

## 2021-01-02 ENCOUNTER — Telehealth: Payer: Self-pay

## 2021-01-02 NOTE — Telephone Encounter (Signed)
Patient was seen in ED.   Nurse Assessment Nurse: Gloriann Loan RN, Sharyn Lull Date/Time (Eastern Time): 12/31/2020 8:38:54 AM Confirm and document reason for call. If symptomatic, describe symptoms. ---Callers wife has Covid and wants to know what to do. She is diabetes. Fever of 101. Symptoms started last night. Had some achy feelings. Does the patient have any new or worsening symptoms? ---Yes Will a triage be completed? ---Yes Related visit to physician within the last 2 weeks? ---No Does the PT have any chronic conditions? (i.e. diabetes, asthma, this includes High risk factors for pregnancy, etc.) ---Yes List chronic conditions. ---Diabetes, hypertension Is this a behavioral health or substance abuse call? ---No Guidelines Guideline Title Affirmed Question Affirmed Notes Nurse Date/Time ([place] Time) COVID-19 - Diagnosed or Suspected [1] Fever > 101 F (38.3 C) AND [2] age > 40 years Gloriann Loan, RN, Sharyn Lull 12/31/2020 8:41:43 AM Disp. Time [place]il[place] Ghazi Time) Disposition Final User PLEASE NOTE: All timestamps contained within this report are represented as [place] Standard Time. CONFIDENTIALTY NOTICE: This fax transmission is intended only for the addressee. It contains information that is legally privileged, confidential or otherwise protected from use or disclosure. If you are not the intended recipient, you are strictly prohibited from reviewing, disclosing, copying using or disseminating any of this information or taking any action in reliance on or regarding this information. If you have received this fax in error, please notify us immediately by telephone so that we can arrange for its return to [place]. Phone: [phone], Toll-Free: [phone], Fax: [phone] Page: 2 of 2 Call Id: [id number] 12/31/2020 8:51:13 AM See HCP within 4 Hours (or PCP triage) Yes Gloriann Loan, RN, Julio Sicks Disagree/Comply Comply Caller Understands Yes PreDisposition Did not know what to do Care Advice  Given Per Guideline SEE HCP (OR PCP TRIAGE) WITHIN 4 HOURS: CALL BACK IF: * You become worse CARE ADVICE given per COVID-19 - DIAGNOSED OR SUSPECTED (Adult) guideline. * IF OFFICE WILL BE CLOSED AND NO PCP (PRIMARY CARE PROVIDER) SECOND-LEVEL TRIAGE: You need to be seen within the next 3 or 4 hours. A nearby Urgent Care Center [facility]) is often a good source of care. Another choice is to go to the ED. Go sooner if you become worse. COVID-19 - HOW TO PROTECT OTHERS - WHEN YOU ARE SICK WITH COVID-19: * STAY HOME A MINIMUM OF 5 DAYS: People with MILD COVID-19 can STOP HOME ISOLATION AFTER 5 DAYS if (1) fever has been gone for 24 hours (without using fever medicine) AND (2) symptoms are better. Continue to wear a well-fitted mask for a full 10 days when around others. * WEAR A MASK FOR 10 DAYS: Wear a well-fitted mask for 10 full days any time you are around others inside your home or in public. Do not go to places where you are unable to wear a mask. FEVER MEDICINES: * For fevers above 101 F (38.3 C) take either acetaminophen or ibuprofen. * They are over-the-counter (OTC) drugs that help treat both fever and pain. You can buy them at the drugstore. * ACETAMINOPHEN - REGULAR STRENGTH TYLENOL: Take 650 mg (two 325 mg pills) by mouth every 4 to 6 hours as needed. Each Regular Strength Tylenol pill has 325 mg of acetaminophen. The most you should take is 10 pills a day (3,250 mg total). Note: In [place], the maximum is 12 pills a day (3,900 mg total). * IBUPROFEN (E.G., MOTRIN, ADVIL): Take 400 mg (two 200 mg pills) by mouth every 6 hours. The most you should take is 6  pills a day (1,200 mg total). Referrals  Urgent [place] at [place]

## 2021-01-02 NOTE — Chronic Care Management (AMB) (Signed)
Chronic Care Management Pharmacy Assistant   Name: Alexandra Fletcher  MRN: [id number] DOB: 05-22-1950   Reason for Encounter: Diabetes Adherence Call    Recent office visits:  None  Recent consult visits:  11/29/2020 OV (sports medicine) Gregor Hams, MD; right knee pain, no medication changes indicated.  11/16/2020 OV (ophthalmology) Marilynne Halsted, MD; no medication changes indicated.  11/07/2020 OV (sports medicine) Dene Gentry, MD; right knee pain, Aleve 2 tabs twice a day with food for 7-10 days then as needed, Tylenol 500 mg 1-2 tabs three times a day for pain.   Hospital visits:  12/31/2020 ED Visit for COVID-19 - Take the Paxlovid twice a day for the next 5 days - Stop your Atorvastatin for the next week - You can take Tylenol and/or Ibuprofen as needed for fever reduction and pain relief.  Medications: Outpatient Encounter Medications as of 01/02/2021  Medication Sig   amLODipine (NORVASC) 10 MG tablet TAKE 1 TABLET (10 MG TOTAL) BY MOUTH DAILY.   aspirin EC 81 MG tablet Take 81 mg by mouth daily.   atorvastatin (LIPITOR) 20 MG tablet Take 1 tablet by mouth 2 times a week.   Blood Glucose Monitoring Suppl (ONE TOUCH ULTRA 2) w/Device KIT USE TO TEST BLOOD SUGAR TWO TO THREE TIMES DAILY   carvedilol (COREG) 6.25 MG tablet Take 1 tablet (6.25 mg total) by mouth 2 (two) times daily.   glipiZIDE (GLUCOTROL XL) 10 MG 24 hr tablet TAKE 1 TABLET BY MOUTH EVERY DAY   glucose blood (ONETOUCH ULTRA) test strip USE TO TEST BLOOD GLUCOSE 2 TO 3 TIMES A DAY   metFORMIN (GLUCOPHAGE) 1000 MG tablet TAKE 1 TABLET BY MOUTH IN THE MORNING, 1/2 TABLET AT LUNCH AND 1 TABLET AT DINNER.   nirmatrelvir/ritonavir EUA (PAXLOVID) 20 x 150 MG & 10 x 100MG TABS Take 2 tablets of nirmatrelvir 150 mg  and 1 tablet of ritonavir 100 mg twice daily for 5 days.   ondansetron (ZOFRAN ODT) 4 MG disintegrating tablet Take 1 tablet (4 mg total) by mouth every 8 (eight) hours as  needed for nausea or vomiting.   OneTouch Delica Lancets 26E MISC USE TO TEST BLOOD SUGAR TWO TO THREE TIMES DAILY   sitaGLIPtin (JANUVIA) 100 MG tablet Take 1 tablet (100 mg total) by mouth daily.   vitamin B-12 (CYANOCOBALAMIN) 100 MCG tablet    No facility-administered encounter medications on file as of 01/02/2021.   Recent Relevant Labs: Lab Results  Component Value Date/Time   HGBA1C 7.4 (H) 07/11/2020 08:38 AM   HGBA1C 7.0 (H) 01/07/2020 08:44 AM   MICROALBUR 11.6 (H) 07/11/2020 08:38 AM   MICROALBUR 3.5 (H) 07/07/2019 10:03 AM    Kidney Function Lab Results  Component Value Date/Time   CREATININE 0.90 07/11/2020 08:38 AM   CREATININE 0.81 07/07/2019 10:03 AM   GFR 64.76 07/11/2020 08:38 AM   GFRNONAA 69 05/16/2007 08:11 AM   GFRAA 83 05/16/2007 08:11 AM    Current antihyperglycemic regimen:  Januvia 100 mg  once daily Glipizide 10 mg once daily Metformin 1000 mg . 1 tablet in morning, 1/2 tablet in the evening  What recent interventions/DTPs have been made to improve glycemic control:  No recent interventions or DTPs.  Have there been any recent hospitalizations or ED visits since last visit with CPP? Yes  Patient denies hypoglycemic symptoms.  Patient denies hyperglycemic symptoms.  How often are you checking your blood sugar? twice daily  What are your blood sugars ranging?  Fasting: 93 Before meals: n/a After meals: n/a Bedtime: n/a  During the week, how often does your blood glucose drop below 70? Never  Are you checking your feet daily/regularly? Yes  Adherence Review: Is the patient currently on a STATIN medication? Yes Is the patient currently on ACE/ARB medication? No Does the patient have >5 day gap between last estimated fill dates? No   Care Gaps: Medicare Annual Wellness: Completed 10/27/2020 Ophthalmology Exam: Next due on 08/25/2021 Foot Exam: Next due on 01/06/2021 Hemoglobin A1C: 7.4% on 07/11/2020 Urine Microalbumin: Next due on  07/11/2021 Colonoscopy: Next due on 07/26/2022 Dexa Scan: Completed Mammogram: Completed 11/11/2019  Future Appointments  Date Time Provider Department Center  01/10/2021  8:40 AM Hunter, Stephen O, MD [facility]  01/13/2021  8:45 AM Nelson, Brian R, PT [facility] None  01/20/2021  8:45 AM Nelson, Brian R, PT [facility] None  11/02/2021 11:45 AM [facility] HEALTH COACH [facility]    Star Rating Drugs: Atorvastatin 20 mg last filled 12/13/2020 84 DS Januvia 100 mg last filled 12/23/2020 30 DS Glipizide 10 mg last filled 11/29/2020 90 DS Metformin 1000 mg 09/26/2020 90 DS  April D Calhoun, CMA Clinical Pharmacist Assistant [phone]  

## 2021-01-04 ENCOUNTER — Other Ambulatory Visit: Payer: Self-pay

## 2021-01-04 ENCOUNTER — Encounter: Payer: Self-pay | Admitting: Family Medicine

## 2021-01-05 ENCOUNTER — Other Ambulatory Visit (HOSPITAL_COMMUNITY): Payer: Self-pay

## 2021-01-06 ENCOUNTER — Encounter: Payer: Medicare Other | Admitting: Physical Therapy

## 2021-01-09 ENCOUNTER — Encounter: Payer: Self-pay | Admitting: Family Medicine

## 2021-01-10 ENCOUNTER — Other Ambulatory Visit: Payer: Self-pay

## 2021-01-10 ENCOUNTER — Encounter: Payer: Self-pay | Admitting: Family Medicine

## 2021-01-10 ENCOUNTER — Ambulatory Visit (INDEPENDENT_AMBULATORY_CARE_PROVIDER_SITE_OTHER): Payer: Medicare Other | Admitting: Family Medicine

## 2021-01-10 VITALS — BP 122/74 | HR 94 | Temp 98.0°F | Wt 169.8 lb

## 2021-01-10 DIAGNOSIS — E118 Type 2 diabetes mellitus with unspecified complications: Secondary | ICD-10-CM | POA: Diagnosis not present

## 2021-01-10 DIAGNOSIS — I152 Hypertension secondary to endocrine disorders: Secondary | ICD-10-CM | POA: Diagnosis not present

## 2021-01-10 DIAGNOSIS — E785 Hyperlipidemia, unspecified: Secondary | ICD-10-CM | POA: Diagnosis not present

## 2021-01-10 DIAGNOSIS — E1159 Type 2 diabetes mellitus with other circulatory complications: Secondary | ICD-10-CM

## 2021-01-10 DIAGNOSIS — E1169 Type 2 diabetes mellitus with other specified complication: Secondary | ICD-10-CM | POA: Diagnosis not present

## 2021-01-10 DIAGNOSIS — Z794 Long term (current) use of insulin: Secondary | ICD-10-CM

## 2021-01-10 LAB — COMPREHENSIVE METABOLIC PANEL
ALT: 28 U/L (ref 0–35)
AST: 18 U/L (ref 0–37)
Albumin: 4.6 g/dL (ref 3.5–5.2)
Alkaline Phosphatase: 68 U/L (ref 39–117)
BUN: 21 mg/dL (ref 6–23)
CO2: 27 mEq/L (ref 19–32)
Calcium: 10.2 mg/dL (ref 8.4–10.5)
Chloride: 99 mEq/L (ref 96–112)
Creatinine, Ser: 0.9 mg/dL (ref 0.40–1.20)
GFR: 64.54 mL/min (ref >60.00)
Glucose, Bld: 182 mg/dL — ABNORMAL HIGH (ref 70–99)
Potassium: 4.3 mEq/L (ref 3.5–5.1)
Sodium: 137 mEq/L (ref 135–145)
Total Bilirubin: 0.5 mg/dL (ref 0.2–1.2)
Total Protein: 7.6 g/dL (ref 6.0–8.3)

## 2021-01-10 LAB — HEMOGLOBIN A1C: Hgb A1c MFr Bld: 7.4 % — ABNORMAL HIGH (ref 4.6–6.5)

## 2021-01-10 LAB — LDL CHOLESTEROL, DIRECT: Direct LDL: 96 mg/dL

## 2021-01-10 NOTE — Patient Instructions (Addendum)
Health Maintenance Due  Topic Date Due   COVID-19 Vaccine (4 - Booster for [employer] series)  - Recommend getting Omicron/Bivalent booster only at your local pharmacy at least 3 months after recently having COVID-19! Please let us know when you have received this vaccination.  02/09/2020   Please stop by lab before you go If you have mychart- we will send your results within 3 business days of [place] receiving them.  If you do not have mychart- we will call you about results within 5 business days of [place] receiving them.  *please also note that you will see labs on mychart as soon as they post. I will later go in and write notes on them- will say "notes from Dr. Yong Channel"  I want to congratulate you on losing 8 lbs since your last visit - keep up the great work!  Reduce amlodipine to 5 mg daily (cut your 10 mg dose in half). Monitor your home blood pressure readings -  the goal at home is to be below 135/85 on average at home. Please update with me within two weeks through MyChart to let me know how you are doing.   Use Voltaren gel 4 times daily in conjunction with Physical Therapy - I think this would be beneficial for your knee pain.  Recommended follow up: Return in about 6 months (around 07/11/2021) for physical or sooner if needed.

## 2021-01-10 NOTE — Progress Notes (Addendum)
Phone [phone] In person visit   Subjective:   Alexandra Fletcher is a 70 y.o. year old very pleasant female patient who presents for/with See problem oriented charting Chief Complaint  Patient presents with   Diabetes   Hypertension   Hyperlipidemia    This visit occurred during the SARS-CoV-2 public health emergency.  Safety protocols were in place, including screening questions prior to the visit, additional usage of staff PPE, and extensive cleaning of exam room while observing appropriate contact time as indicated for disinfecting solutions.   Past Medical History-  Patient Active Problem List   Diagnosis Date Noted   History of CVA - Acute right arterial ischemic stroke, PCA (posterior cerebral artery) 01/04/2013    Priority: 1.   Type 2 diabetes mellitus with ophthalmic complication ([place]) 39/53/2023    Priority: 1.   Diabetic retinopathy ([place]) 12/19/2014    Priority: 2.   Hyperlipidemia associated with type 2 diabetes mellitus ([place]) 08/16/2009    Priority: 2.   Hypertension associated with diabetes ([place]) 08/16/2009    Priority: 2.   Dysphagia, pharyngoesophageal phase 02/21/2015    Priority: 3.   Eosinophilia 12/03/2011    Priority: 3.   Sinus tarsi syndrome 01/31/2011    Priority: 3.   Baker's cyst of knee 10/10/2010    Priority: 3.   History of colonic polyps 08/16/2009    Priority: 3.   HEART MURMUR, BENIGN 05/16/2007    Priority: 3.   History of abnormal cervical Pap smear 04/21/2007    Priority: 3.   Type 2 diabetes mellitus with peripheral vascular disease ([place]) 07/07/2019   Posterior tibial tendinitis of right lower extremity 04/01/2018    Medications- reviewed and updated Current Outpatient Medications  Medication Sig Dispense Refill   amLODipine (NORVASC) 10 MG tablet TAKE 1 TABLET (10 MG TOTAL) BY MOUTH DAILY. 90 tablet 3   aspirin EC 81 MG tablet Take 81 mg by mouth daily.     atorvastatin (LIPITOR) 20 MG tablet Take 1 tablet by mouth  2 times a week. 24 tablet 3   Blood Glucose Monitoring Suppl (ONE TOUCH ULTRA 2) w/Device KIT USE TO TEST BLOOD SUGAR TWO TO THREE TIMES DAILY 1 kit 0   carvedilol (COREG) 6.25 MG tablet Take 1 tablet (6.25 mg total) by mouth 2 (two) times daily. 180 tablet 0   glipiZIDE (GLUCOTROL XL) 10 MG 24 hr tablet TAKE 1 TABLET BY MOUTH EVERY DAY 90 tablet 2   glucose blood (ONETOUCH ULTRA) test strip USE TO TEST BLOOD GLUCOSE 2 TO 3 TIMES A DAY 100 strip 2   metFORMIN (GLUCOPHAGE) 1000 MG tablet TAKE 1 TABLET BY MOUTH IN THE MORNING, 1/2 TABLET AT LUNCH AND 1 TABLET AT DINNER. 225 tablet 3   ondansetron (ZOFRAN ODT) 4 MG disintegrating tablet Take 1 tablet (4 mg total) by mouth every 8 (eight) hours as needed for nausea or vomiting. 20 tablet 0   OneTouch Delica Lancets 34D MISC USE TO TEST BLOOD SUGAR TWO TO THREE TIMES DAILY 100 each 1   sitaGLIPtin (JANUVIA) 100 MG tablet Take 1 tablet (100 mg total) by mouth daily. 90 tablet 1   vitamin B-12 (CYANOCOBALAMIN) 100 MCG tablet      No current facility-administered medications for this visit.     Objective:  BP 122/74 Comment: at home BP reading  Pulse 94   Temp 98 F (36.7 C) (Temporal)   Wt 169 lb 12.8 oz (77 kg)   LMP  (LMP Unknown)  SpO2 98%   BMI 30.08 kg/m  Gen: NAD, resting comfortably CV: RRR no murmurs rubs or gallops Lungs: CTAB no crackles, wheeze, rhonchi Abdomen: soft/nontender/nondistended/normal bowel sounds. No rebound or guarding.  Ext: minimal edema but did not have amlodipine yet today Skin: warm, dry  Diabetic Foot Exam - Simple   Simple Foot Form Diabetic Foot exam was performed with the following findings: Yes 01/10/2021  9:10 AM  Visual Inspection No deformities, no ulcerations, no other skin breakdown bilaterally: Yes Sensation Testing Intact to touch and monofilament testing bilaterally: Yes Pulse Check Posterior Tibialis and Dorsalis pulse intact bilaterally: Yes Comments       Assessment and Plan   #  Urgent Care F/U for COVID-19 S:Patient presented to Urgent Care for evaluation on 12/31/20 testing positive for COVID-19 - reported having  fever, cough, and generalized body aches that started the day prior to the encounter.   As home COVID test was positive additional COVID testing was not needed during that time. Patient was within the timeframe to start antiviral treatment due to early onset of symptoms - plan was discussed and patient wanted to be started on the treatment. Kidney function in April was normal with a GFR of greater than 60 so patient was a candidate for Paxlovid. She also takes atorvastatin twice weekly and was instructed to hold on the medications for the following week. Also told to take tylenol and ibuprofen as needed for fever reduction and pain relief. Instructed for her cough she could try 1/2 to 1 teaspoon of honey with a fluid, guaifenesin and dextromethorphan for cough, or [place] a humidifier for congestion and cough. Patient was encouraged to push fluids and rest and to follow-up as needed.  Today patient reports was pretty sick for a few days and then recovered- lingering cough. Fatigue and mental fog have cleared.  A/P: doing well. She is planning on holding off on bivalent booster for now- advised has to wait 3 months.    # Diabetes-typically well controlled with A1c 7 or less S: Medication: Januvia 100 mg daily, Metformin 1000Mg - 1g AM, 51m lunch, 1 g PM, glipizide 10 mg extended release daily  -Takes B12 with long-term metformin use.  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 Continue current meds for now  #hypertension S: medication: Amlodipine 10 mg daily (did not take yet today), carvedilol 6.25 mg twice daily Home readings #s: Patient brings [place] copy of home blood pressure readings.  Average over 12 readings 124/72 and most recent reading 122/74 listed below-traditionally has whitecoat element so thankful that she brought this copy - feels swollen overall on amlodipine. Even by dinnertime feels like affects her swallowing.  -hctz increased urination years ago BP Readings from Last 3 Encounters:  01/10/21 122/74  12/31/20 (!) 176/84  11/29/20 (!) 152/92  A/P: Blood pressure very well controlled at home.  We  discussed reducing amlodipine to 5 mg due to edema issues during the daytime that progressed as the day goes on.  We discussed hoping home blood pressures remain less than 135/85 and that edema decreases.  She will update me in 2 weeks by MyChart-could also consider hydrochlorothiazide but did increase urination in the past -she will cut current pills in half before we send in 5 mg dose -edema even seems to get to the throat- may also need GI follow up   #hyperlipidemia S: Medication: atorvastatin 20 mg twice a week Lab Results  Component Value Date   CHOL 160 07/11/2020   HDL 74.40 07/11/2020   LDLCALC 73 07/11/2020   LDLDIRECT 72 01/07/2020   TRIG 63.0 07/11/2020   CHOLHDL 2 07/11/2020  A/P: Cholesterol very close to ideal goal of 70 or less at 73 on most recent reading-check full lipid panel at next visit  # Right knee pain S:was stretching knee in mid July- heard a pop. Right knee was scoped years ago- intermittent issues since then. Also has bakers cyst. Has seen sports med and seeing PT. No instability- more of sharp pain.   -didn't tolerate injections of steroid in past due to elevated  blood sugars A/P: I encouraged her to continue with PT but if fails to improve- move forward with repeat SM visit- may need gel injections or even surgery in future  Recommended follow up: No follow-ups on file. Future Appointments  Date Time Provider [place]  01/13/2021  8:45 AM Elsie Ra R, PT [facility] None  01/20/2021  8:45 AM Debbe Odea, PT [facility] None  11/02/2021 11:45 AM [facility] HE[facility] COACH [facility]   Lab/Order associations:   ICD-10-CM   1. Hyperlipidemia associated with type 2 diabetes mellitus (HCC)  E11.69 Comprehensive metabolic panel   G62.6 LDL cholesterol, direct    2. Hypertension associated with diabetes ([place])  E11.59    I15.2     3. Controlled type 2 diabetes mellitus with complication, with long-term current use of insulin (HCC)  E11.8 Hemoglobin A1c   Z79.4       No orders of the defined types were placed in this encounter.  I,Harris Phan,acting as a Education administrator for Garret Reddish, MD.,have documented all relevant documentation on the behalf of Garret Reddish, MD,as directed by  Garret Reddish, MD while in the presence of Garret Reddish, MD.  I, Garret Reddish, MD, have reviewed all documentation for this visit. The documentation on 01/10/21 for the exam, diagnosis, procedures, and orders are all accurate and complete.   Return precautions advised.  Garret Reddish, MD

## 2021-01-12 ENCOUNTER — Other Ambulatory Visit (HOSPITAL_COMMUNITY): Payer: Self-pay

## 2021-01-12 ENCOUNTER — Telehealth: Payer: Self-pay

## 2021-01-12 ENCOUNTER — Encounter: Payer: Self-pay | Admitting: Family Medicine

## 2021-01-12 MED ORDER — ATORVASTATIN CALCIUM 20 MG PO TABS: ORAL_TABLET | ORAL | 3 refills | Status: DC

## 2021-01-12 MED ORDER — METFORMIN HCL 1000 MG PO TABS: ORAL_TABLET | ORAL | 3 refills | Status: DC

## 2021-01-12 MED ORDER — CARVEDILOL 6.25 MG PO TABS: 6.2500 mg | ORAL_TABLET | Freq: Two times a day (BID) | ORAL | 0 refills | Status: DC

## 2021-01-12 MED ORDER — GLIPIZIDE ER 10 MG PO TB24: ORAL_TABLET | Freq: Every day | ORAL | 2 refills | Status: DC

## 2021-01-12 MED ORDER — AMLODIPINE BESYLATE 10 MG PO TABS: ORAL_TABLET | Freq: Every day | ORAL | 3 refills | Status: DC

## 2021-01-12 NOTE — Telephone Encounter (Signed)
Medications have been filled

## 2021-01-12 NOTE — Telephone Encounter (Signed)
LAST APPOINTMENT DATE:  01/10/21  NEXT APPOINTMENT DATE: 07/25/21  MEDICATION:metFORMIN (GLUCOPHAGE) 1000 MG tablet (Expired)  glipiZIDE (GLUCOTROL XL) 10 MG 24 hr tablet  carvedilol (COREG) 6.25 MG tablet  amLODipine (NORVASC) 10 MG tablet  atorvastatin (LIPITOR) 20 MG tablet PHARMACY: [facility] [id number] - [place], [place] [address]

## 2021-01-13 ENCOUNTER — Ambulatory Visit: Payer: Medicare Other | Admitting: Physical Therapy

## 2021-01-13 ENCOUNTER — Other Ambulatory Visit: Payer: Self-pay

## 2021-01-13 DIAGNOSIS — R262 Difficulty in walking, not elsewhere classified: Secondary | ICD-10-CM | POA: Diagnosis not present

## 2021-01-13 DIAGNOSIS — R600 Localized edema: Secondary | ICD-10-CM

## 2021-01-13 DIAGNOSIS — M25561 Pain in right knee: Secondary | ICD-10-CM

## 2021-01-13 DIAGNOSIS — M6281 Muscle weakness (generalized): Secondary | ICD-10-CM | POA: Diagnosis not present

## 2021-01-13 NOTE — Therapy (Signed)
[facility] Physical Therapy [address] [place], [place], [address] Phone: [phone]   Fax:  [phone]  Physical Therapy Treatment  Patient Details  Name: Alexandra Fletcher MRN: [id number] Date of Birth: July 05, 1950 Referring Provider (PT): Georgina Snell   Encounter Date: 01/13/2021   PT End of Session - 01/13/21 0940     Visit Number 3    Number of Visits 6    Date for PT Re-Evaluation 02/03/21    Authorization Type UHC MCR    Progress Note Due on Visit 10    PT Start Time 0846    PT Stop Time 0933    PT Time Calculation (min) 47 min    Activity Tolerance Patient tolerated treatment well    Behavior During Therapy [facility] for tasks assessed/performed             Past Medical History:  Diagnosis Date   Allergy    Arthritis    Cataract    mild x both eyes    Cerebral thrombosis with cerebral infarction [facility]) 2002   diagnosis needs confirmed by review of [facility] records   Diabetes mellitus    Heart murmur    1x per Dr. Linna Darner- not recurrent   Hyperlipidemia    Hypertension    Partial rupture of Achilles tendon    After fluorquinolone use    Past Surgical History:  Procedure Laterality Date   CATARACT EXTRACTION, BILATERAL     COLONOSCOPY  2008   neg   DILATION AND CURETTAGE OF UTERUS     X2   KNEE ARTHROSCOPY W/ MENISCAL REPAIR  2008    Dr Adella Nissen knee   Ovarian cysts removed      Dr Warnell Forester   POLYPECTOMY  2003   polypoid colonic mucosa/ 2008 no polyps   THUMB SURGERY  1983   Left; post skiing accident in [place]    There were no vitals filed for this visit.   Subjective Assessment - 01/13/21 0907     Subjective She relays her knee pain has been hanging around 5/10 all the time now, she is frustrated by this, she also had covid so she had to miss PT last week and she still feels overall tired by this    Diagnostic tests Imaging: US Impression: "Degenerative medial joint line with degenerative medial meniscus with  extrusion and moderate Baker's cyst." Knee XR "Moderate medial compartment DJD with mild to moderate patellofemoral DJD"    Patient Stated Goals reduce pain    Pain Onset More than a month ago    Aggravating Factors  walking, flexing her knee    Pain Relieving Factors rest                               [facility] Adult PT Treatment/Exercise - 01/13/21 0001       Knee/Hip Exercises: Aerobic   Nustep L5 X 8 min      Knee/Hip Exercises: Machines for Strengthening   Cybex Knee Extension 10# up with both down with Rt only 2X10    Cybex Knee Flexion 25# DL 2X15    Cybex Leg Press 100# DL X20, then 50# Rt leg only 2X20      Knee/Hip Exercises: Standing   Lateral Step Up Right;2 sets;10 reps;Step Height: 6";Hand Hold: 0    Forward Step Up Right;2 sets;10 reps;Hand Hold: 0;Step Height: 6"    Other Standing Knee Exercises TRX squats 2X10  Modalities   Modalities Ultrasound      Ultrasound   Ultrasound Location Rt medial knee    Ultrasound Parameters 1.0 w/cm2, 3 mhz, 100% X8 min    Ultrasound Goals Pain                       PT Short Term Goals - 12/27/20 1607       PT SHORT TERM GOAL #1   Title Pt will be I and compliant with HEP.    Time 4    Period Weeks    Status On-going               PT Long Term Goals - 12/23/20 1135       PT LONG TERM GOAL #1   Title Pt will improve FOTO score to predicted goal of 68% funcitonal    Time 6    Period Weeks    Status New      PT LONG TERM GOAL #2   Title Pt will improve Rt quad strength to 5/5 and at least 55 lbs of force on HHD to show improved functional strength    Time 6    Period Weeks    Status New      PT LONG TERM GOAL #3   Title Pt will improve Rt knee flexion ROM to at least 135 degrees for functional mobility    Time 6    Period Weeks    Status New      PT LONG TERM GOAL #4   Title Pt will reduce overall pain to less than 3/10 with walking, stairs, standing, squatting, ADL's     Time 6    Period Weeks    Status New                   Plan - 01/13/21 0941     Clinical Impression Statement Ionto has only helped for a day she relays so in efforts to reduce pain and inflammation more long lasting trialed U.S with biofreeze. We also continued to work on her overall knee strength to her tolreance level. If no improvment in next 2 weeks will refer back to MD.    Personal Factors and Comorbidities Past/Current Experience;Comorbidity 2    Comorbidities DM,OA,previous knee scope with meniscal repair 2008    Examination-Activity Limitations Bend;Squat;Stairs;Lift;Stand;Locomotion Level    Examination-Participation Restrictions Cleaning;Community Activity;Yard Work;Shop;Occupation    Stability/Clinical Decision Making Stable/Uncomplicated    Rehab Potential Good    PT Frequency 1x / week    PT Duration 6 weeks    PT Treatment/Interventions ADLs/Self Care Home Management;Cryotherapy;Electrical Stimulation;Iontophoresis 4mg /ml Dexamethasone;Moist Heat;Ultrasound;Gait training;Therapeutic activities;Therapeutic exercise;Neuromuscular re-education;Manual techniques;Passive range of motion;Dry needling;Joint Manipulations;Vasopneumatic Device;Taping    PT Next Visit Plan how was U.S? continued progressive quad strengthening in WB, NWB activity.    PT Home Exercise Plan Access Code: [email or web address]    Consulted and Agree with Plan of Care Patient             Patient will benefit from skilled therapeutic intervention in order to improve the following deficits and impairments:  Decreased activity tolerance, Decreased range of motion, Decreased strength, Difficulty walking, Impaired flexibility, Pain  Visit Diagnosis: Acute pain of right knee  Muscle weakness (generalized)  Difficulty in walking, not elsewhere classified  Localized edema     Problem List Patient Active Problem List   Diagnosis Date Noted   Type 2 diabetes mellitus with peripheral vascular  disease ([place]) 07/07/2019  Posterior tibial tendinitis of right lower extremity 04/01/2018   Dysphagia, pharyngoesophageal phase 02/21/2015   Diabetic retinopathy ([place]) 12/19/2014   History of CVA - Acute right arterial ischemic stroke, PCA (posterior cerebral artery) 01/04/2013   Eosinophilia 12/03/2011   Sinus tarsi syndrome 01/31/2011   Baker's cyst of knee 10/10/2010   Hyperlipidemia associated with type 2 diabetes mellitus ([place]) 08/16/2009   Hypertension associated with diabetes ([place]) 08/16/2009   History of colonic polyps 08/16/2009   HEART MURMUR, BENIGN 05/16/2007   History of abnormal cervical Pap smear 04/21/2007   Type 2 diabetes mellitus with ophthalmic complication ([place]) 44/96/7591    Debbe Odea, PT,DPT 01/13/2021, 9:43 AM  [facility] Physical Therapy [address] [place], [place], [address] Phone: [phone]   Fax:  [phone]  Name: Teal Raben MRN: [id number] Date of Birth: 1950-08-01

## 2021-01-19 ENCOUNTER — Other Ambulatory Visit: Payer: Self-pay

## 2021-01-19 ENCOUNTER — Telehealth: Payer: Self-pay

## 2021-01-19 MED ORDER — SITAGLIPTIN PHOSPHATE 100 MG PO TABS: 100.0000 mg | ORAL_TABLET | Freq: Every day | ORAL | 1 refills | Status: DC

## 2021-01-19 NOTE — Telephone Encounter (Signed)
Refill sent to pharmacy.   

## 2021-01-19 NOTE — Telephone Encounter (Signed)
LAST APPOINTMENT DATE:  01/10/21  NEXT APPOINTMENT DATE: 07/25/21  MEDICATION:sitaGLIPtin (JANUVIA) 100 MG tablet  PHARMACY: [facility] [id number] - [place], [address]    **pt would like a call when prescription is refilled**

## 2021-01-20 ENCOUNTER — Other Ambulatory Visit: Payer: Self-pay

## 2021-01-20 ENCOUNTER — Ambulatory Visit: Payer: Medicare Other | Admitting: Physical Therapy

## 2021-01-20 DIAGNOSIS — M25561 Pain in right knee: Secondary | ICD-10-CM

## 2021-01-20 DIAGNOSIS — M6281 Muscle weakness (generalized): Secondary | ICD-10-CM | POA: Diagnosis not present

## 2021-01-20 DIAGNOSIS — R262 Difficulty in walking, not elsewhere classified: Secondary | ICD-10-CM

## 2021-01-20 DIAGNOSIS — R600 Localized edema: Secondary | ICD-10-CM

## 2021-01-20 MED ORDER — ATORVASTATIN CALCIUM 20 MG PO TABS: ORAL_TABLET | ORAL | 1 refills | Status: DC

## 2021-01-20 NOTE — Therapy (Signed)
[facility] Physical Therapy [address] [place], [place], [address] Phone: [phone]   Fax:  [phone]  Physical Therapy Treatment/Discharge PHYSICAL THERAPY DISCHARGE SUMMARY  Visits from Start of Care: 4  Current functional level related to goals / functional outcomes: Decreased standing and walking abilities with ADL's due to knee pain   Remaining deficits: Limited by knee pain   Education / Equipment: HEP and recommendation to return to MD for further guidance for treatment options  Plan: Patient agrees to discharge.  Patient goals were not met. Patient is being discharged due to not making progress with her knee pain with PT.      Patient Details  Name: Alexandra Fletcher MRN: [id number] Date of Birth: 03-30-1950 Referring Provider (PT): Georgina Snell   Encounter Date: 01/20/2021   PT End of Session - 01/20/21 0910     Visit Number 4    Number of Visits 6    Date for PT Re-Evaluation 02/03/21    Authorization Type UHC MCR    Progress Note Due on Visit 10    PT Start Time 0846    PT Stop Time 0908    PT Time Calculation (min) 22 min    Activity Tolerance Patient tolerated treatment well    Behavior During Therapy [facility] for tasks assessed/performed             Past Medical History:  Diagnosis Date   Allergy    Arthritis    Cataract    mild x both eyes    Cerebral thrombosis with cerebral infarction [facility]) 2002   diagnosis needs confirmed by review of [place] records   Diabetes mellitus    Heart murmur    1x per Dr. Linna Darner- not recurrent   Hyperlipidemia    Hypertension    Partial rupture of Achilles tendon    After fluorquinolone use    Past Surgical History:  Procedure Laterality Date   CATARACT EXTRACTION, BILATERAL     COLONOSCOPY  2008   neg   DILATION AND CURETTAGE OF UTERUS     X2   KNEE ARTHROSCOPY W/ MENISCAL REPAIR  2008    Dr Adella Nissen knee   Ovarian cysts removed      Dr Warnell Forester   POLYPECTOMY  2003    polypoid colonic mucosa/ 2008 no polyps   THUMB SURGERY  1983   Left; post skiing accident in [place]    There were no vitals filed for this visit.   Subjective Assessment - 01/20/21 0909     Subjective relays her pain is worse and now she is even reluctant to go to the grocery store. PT modalaties and exercises have only provided short tempory relief for about a day and overall pain continues to worsen.    Diagnostic tests Imaging: US Impression: "Degenerative medial joint line with degenerative medial meniscus with extrusion and moderate Baker's cyst." Knee XR "Moderate medial compartment DJD with mild to moderate patellofemoral DJD"    Patient Stated Goals reduce pain    Pain Onset More than a month ago                PT Short Term Goals - 01/20/21 0917       PT SHORT TERM GOAL #1   Title Pt will be I and compliant with HEP.    Time 4    Period Weeks    Status Achieved               PT Long Term Goals -  01/20/21 0917       PT LONG TERM GOAL #1   Title Pt will improve FOTO score to predicted goal of 68% funcitonal    Time 6    Period Weeks    Status Not Met      PT LONG TERM GOAL #2   Title Pt will improve Rt quad strength to 5/5 and at least 55 lbs of force on HHD to show improved functional strength    Baseline did not test today    Time 6    Period Weeks    Status Not Met      PT LONG TERM GOAL #3   Title Pt will improve Rt knee flexion ROM to at least 135 degrees for functional mobility    Time 6    Period Weeks    Status Achieved      PT LONG TERM GOAL #4   Title Pt will reduce overall pain to less than 3/10 with walking, stairs, standing, squatting, ADL's    Time 6    Period Weeks    Status Not Met                   Plan - 01/20/21 0911     Clinical Impression Statement She has failed conservate PT for her knee pain. We have tried modalties and exercises but this has only provided short temporary relief. I have exhuasted PT  interventions and have concerns she may need surgery and or imaging to better manage her knee pain. Overall she has good strength and ROM but continues to be limited by pain that PT is not able to improve. We will discharge her today and refer back to MD for further evaluation. Time spent today was to discuss her PT plan to discharge, review knee anatomy, discuss possible rehab times if she has to have surgery, and to review her treatment options including injections, with recommendations to return to MD for further guidance.    Personal Factors and Comorbidities Past/Current Experience;Comorbidity 2    Comorbidities DM,OA,previous knee scope with meniscal repair 2008    Examination-Activity Limitations Bend;Squat;Stairs;Lift;Stand;Locomotion Level    Examination-Participation Restrictions Cleaning;Community Activity;Yard Work;Shop;Occupation    Stability/Clinical Decision Making Stable/Uncomplicated    Rehab Potential Good    PT Frequency 1x / week    PT Duration 6 weeks    PT Treatment/Interventions ADLs/Self Care Home Management;Cryotherapy;Electrical Stimulation;Iontophoresis 5m/ml Dexamethasone;Moist Heat;Ultrasound;Gait training;Therapeutic activities;Therapeutic exercise;Neuromuscular re-education;Manual techniques;Passive range of motion;Dry needling;Joint Manipulations;Vasopneumatic Device;Taping    PT Next Visit Plan DC today    PT Home Exercise Plan Access Code: [email or web address]    Consulted and Agree with Plan of Care Patient             Patient will benefit from skilled therapeutic intervention in order to improve the following deficits and impairments:  Decreased activity tolerance, Decreased range of motion, Decreased strength, Difficulty walking, Impaired flexibility, Pain  Visit Diagnosis: Acute pain of right knee  Muscle weakness (generalized)  Difficulty in walking, not elsewhere classified  Localized edema     Problem List Patient Active Problem List   Diagnosis Date  Noted   Type 2 diabetes mellitus with peripheral vascular disease ([place] 07/07/2019   Posterior tibial tendinitis of right lower extremity 04/01/2018   Dysphagia, pharyngoesophageal phase 02/21/2015   Diabetic retinopathy ([place] 12/19/2014   History of CVA - Acute right arterial ischemic stroke, PCA (posterior cerebral artery) 01/04/2013   Eosinophilia 12/03/2011   Sinus tarsi syndrome 01/31/2011   Baker's  cyst of knee 10/10/2010   Hyperlipidemia associated with type 2 diabetes mellitus ([place]) 08/16/2009   Hypertension associated with diabetes ([place]) 08/16/2009   History of colonic polyps 08/16/2009   HEART MURMUR, BENIGN 05/16/2007   History of abnormal cervical Pap smear 04/21/2007   Type 2 diabetes mellitus with ophthalmic complication ([place]) 62/22/9798    Debbe Odea, PT,DPT 01/20/2021, 9:18 AM  [facility] Physical Therapy [address] [place], [place], [address] Phone: [phone]   Fax:  [phone]  Name: Leafy Motsinger MRN: [id number] Date of Birth: 1951/03/11

## 2021-01-23 MED ORDER — ATORVASTATIN CALCIUM 20 MG PO TABS: ORAL_TABLET | ORAL | 3 refills | Status: DC

## 2021-01-24 ENCOUNTER — Other Ambulatory Visit: Payer: Self-pay

## 2021-01-24 MED ORDER — ATORVASTATIN CALCIUM 20 MG PO TABS: 20.0000 mg | ORAL_TABLET | ORAL | 3 refills | Status: DC

## 2021-01-24 NOTE — Progress Notes (Signed)
I, Wendy Poet, LAT, ATC, am serving as scribe for Dr. Lynne Leader.  Alexandra Fletcher is a 70 y.o. female who presents to [place] at [facility] today for f/u of R knee pain.  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 She was last seen by Dr. Georgina Snell on 11/29/20 and was referred to PT of which she has completed 4 sessions.  She was also advised to use a knee compression sleeve.  She has a hx of prior R knee meniscus tear and surgery in 2008 and has a hx of a Baker's cyst. Today, pt reports R knee pain has continued. Pt is looking for a more permanent solution to her knee pain.  Diagnostic testing: R knee XR- 11/29/20  Pertinent review of systems: no fever or chills  Relevant historical information: Diabetes and hypertension.  History of peripheral vascular disease.   Exam:  BP (!) 144/88   Pulse (!) 105   Ht 5\' 3"  (1.6 m)   Wt 171 lb 6.4 oz (77.7 kg)   LMP  (LMP Unknown)   SpO2 98%   BMI 30.36 kg/m  General: Well Developed, well nourished, and in no acute distress.   MSK: Right knee mild quad atrophy.  Normal motion with crepitation.  Tender palpation medial joint line.  Intact strength.    Lab and Radiology Results  EXAM: RIGHT KNEE 3 VIEWS   COMPARISON:  No recent.   FINDINGS: Tricompartment degenerative change. Degenerative changes most prominent about the patellofemoral and medial compartments. No acute bony or joint abnormality. No evidence of effusion.   IMPRESSION: Tricompartment degenerative changes. No acute abnormality identified. No evidence of fracture or dislocation.     Electronically Signed   By: Marcello Moores  Register M.D.   On: 11/30/2020 09:43   I, Lynne Leader, personally (independently) visualized and performed the interpretation of the images attached in this note.  Lab Results  Component Value Date   HGBA1C 7.4 (H) 01/10/2021      Chemistry      Component Value Date/Time    NA 137 01/10/2021 0928   K 4.3 01/10/2021 0928   CL 99 01/10/2021 0928   CO2 27 01/10/2021 0928   BUN 21 01/10/2021 0928   CREATININE 0.90 01/10/2021 0928      Component Value Date/Time   CALCIUM 10.2 01/10/2021 0928   ALKPHOS 68 01/10/2021 0928   AST 18 01/10/2021 0928   ALT 28 01/10/2021 0928   BILITOT 0.5 01/10/2021 0928     Lab Results  Component Value Date   WBC 7.8 07/11/2020   HGB 14.9 07/11/2020   HCT 43.4 07/11/2020   MCV 89.4 07/11/2020   PLT 267.0 07/11/2020        Assessment and Plan: 70 y.o. female with right knee pain due to significant DJD predominantly at the medial compartment with some patellofemoral compartment involvement..  We discussed potential options.  Certainly we could pursue hyaluronic acid.  Lanny Hurst and Durolane are approved.  However she would like to proceed to more definitive treatment options.  In 2008 she had good results with a arthroscopic surgery I think to debride and meniscus tear.  She would like to proceed to that kind of surgery.  Unfortunately after reviewing her x-rays and discussing moderate understanding of treatment options I do not think that she is a good candidate for arthroscopic surgery given the significance of her DJD.  I think if  surgery is an option for her either a partial knee replacement or a total knee replacement is the best option.  She is willing to consider this and would like to move to more definitive treatment options.  Plan to refer to orthopedic surgery to discuss surgical options.  Certainly could proceed with hyaluronic acid injections if needed.   PDMP not reviewed this encounter. Orders Placed This Encounter  Procedures   Ambulatory referral to Orthopedic Surgery    Referral Priority:   Routine    Referral Type:   Surgical    Referral Reason:   Specialty Services Required    Requested Specialty:   Orthopedic Surgery    Number of Visits Requested:   1   No orders of the defined types were placed in  this encounter.    Discussed warning signs or symptoms. Please see discharge instructions. Patient expresses understanding.   The above documentation has been reviewed and is accurate and complete Lynne Leader, M.D. Total encounter time 20 minutes including face-to-face time with the patient and, reviewing past medical record, and charting on the date of service.   Treatment plan and options

## 2021-01-25 ENCOUNTER — Ambulatory Visit: Payer: Medicare Other | Admitting: Family Medicine

## 2021-01-25 ENCOUNTER — Other Ambulatory Visit: Payer: Self-pay

## 2021-01-25 VITALS — BP 144/88 | HR 105 | Ht 63.0 in | Wt 171.4 lb

## 2021-01-25 DIAGNOSIS — M25561 Pain in right knee: Secondary | ICD-10-CM

## 2021-01-25 DIAGNOSIS — M1711 Unilateral primary osteoarthritis, right knee: Secondary | ICD-10-CM

## 2021-01-25 NOTE — Patient Instructions (Addendum)
Thank you for coming in today.   I placed a referral to [facility]. You should hear about scheduling within a week. If you don't, please let me know.  Recheck back as needed

## 2021-01-31 ENCOUNTER — Encounter: Payer: Self-pay | Admitting: Orthopaedic Surgery

## 2021-01-31 ENCOUNTER — Other Ambulatory Visit: Payer: Self-pay

## 2021-01-31 ENCOUNTER — Ambulatory Visit: Payer: Medicare Other | Admitting: Orthopaedic Surgery

## 2021-01-31 VITALS — Ht 63.5 in | Wt 170.0 lb

## 2021-01-31 DIAGNOSIS — M1711 Unilateral primary osteoarthritis, right knee: Secondary | ICD-10-CM

## 2021-01-31 NOTE — Progress Notes (Addendum)
Office Visit Note   Patient: Alexandra Fletcher           Date of Birth: 1950-08-30           MRN: [id number] Visit Date: 01/31/2021              Requested by: Gregor Hams, MD [place],  [place] [id number] PCP: Marin Olp, MD   Assessment & Plan: Visit Diagnoses:  1. Primary osteoarthritis of right knee     Plan: Impression is end-stage right knee DJD.  Patient has undergone extensive conservative management over the last decade or more and at this point is no longer finding relief.  Based on her options she has elected to move forward with a right total knee replacement in the near future.  Risk benefits rehab recovery reviewed with the patient in detail.  Denies history of DVT, nickel allergy.  We will obtain necessary clearance from Dr. Yong Channel prior to scheduling surgery.  She is a well-controlled diabetic with recent A1c of 7.4.  Follow-Up Instructions: No follow-ups on file.   Orders:  No orders of the defined types were placed in this encounter.  No orders of the defined types were placed in this encounter.     Procedures: No procedures performed   Clinical Data: No additional findings.   Subjective: Chief Complaint  Patient presents with   Right Knee - Pain    Alexandra Fletcher is a very pleasant 70 year old female mother-in-law of [place] to occupational therapist at [facility].  She comes in today for surgical consultation of right knee replacement.  She has had symptoms for years and has been treating this with conservative management.  She has had a cortisone injection in the past which shot her blood sugars up.  She is done extensive physical therapy which used to be effective.  She has had a prior right knee arthroscopy with partial medial meniscectomy by Dr. Durward Fortes in 2008.  She is now having severe pain and limitation with daily activities and ability to teach swim lessons.   Review of Systems  Constitutional:  Negative.   HENT: Negative.    Eyes: Negative.   Respiratory: Negative.    Cardiovascular: Negative.   Endocrine: Negative.   Musculoskeletal: Negative.   Neurological: Negative.   Hematological: Negative.   Psychiatric/Behavioral: Negative.    All other systems reviewed and are negative.   Objective: Vital Signs: Ht 5' 3.5" (1.613 m)   Wt 170 lb (77.1 kg)   LMP  (LMP Unknown)   BMI 29.64 kg/m   Physical Exam Vitals and nursing note reviewed.  Constitutional:      Appearance: She is well-developed.  Pulmonary:     Effort: Pulmonary effort is normal.  Skin:    General: Skin is warm.     Capillary Refill: Capillary refill takes less than 2 seconds.  Neurological:     Mental Status: She is alert and oriented to person, place, and time.  Psychiatric:        Behavior: Behavior normal.        Thought Content: Thought content normal.        Judgment: Judgment normal.    Ortho Exam  Right knee shows no joint effusion.  2+ patellofemoral crepitus with range of motion.  Slight medial joint line tenderness.  Collaterals and cruciates are stable.  Pain with range of motion past 120 degrees.  Specialty Comments:  No specialty comments available.  Imaging: No results  found.   PMFS History: Patient Active Problem List   Diagnosis Date Noted   Type 2 diabetes mellitus with peripheral vascular disease ([place]) 07/07/2019   Posterior tibial tendinitis of right lower extremity 04/01/2018   Dysphagia, pharyngoesophageal phase 02/21/2015   Diabetic retinopathy ([place]) 12/19/2014   History of CVA - Acute right arterial ischemic stroke, PCA (posterior cerebral artery) 01/04/2013   Eosinophilia 12/03/2011   Sinus tarsi syndrome 01/31/2011   Baker's cyst of knee 10/10/2010   Hyperlipidemia associated with type 2 diabetes mellitus ([place]) 08/16/2009   Hypertension associated with diabetes ([place]) 08/16/2009   History of colonic polyps 08/16/2009   HEART MURMUR, BENIGN 05/16/2007   History  of abnormal cervical Pap smear 04/21/2007   Type 2 diabetes mellitus with ophthalmic complication ([place]) 16/12/9602   Past Medical History:  Diagnosis Date   Allergy    Arthritis    Cataract    mild x both eyes    Cerebral thrombosis with cerebral infarction [facility]) 2002   diagnosis needs confirmed by review of [place] records   Diabetes mellitus    Heart murmur    1x per Dr. Linna Darner- not recurrent   Hyperlipidemia    Hypertension    Partial rupture of Achilles tendon    After fluorquinolone use    Family History  Problem Relation Age of Onset   Liver disease Mother        ? malignancy after Hepatitis   Diabetes Father    Heart attack Father        CBAG @ 35   Multiple myeloma Father    Hyperlipidemia Father    Hypertension Father    Diabetes Brother        Type 1 DM   Heart attack Brother 10   Stroke Maternal Aunt        > 83   Breast cancer Cousin    Other Sister        died as child from pneumonia   Healthy Brother    Colon cancer Neg Hx    Colon polyps Neg Hx    Esophageal cancer Neg Hx    Rectal cancer Neg Hx    Stomach cancer Neg Hx     Past Surgical History:  Procedure Laterality Date   CATARACT EXTRACTION, BILATERAL     COLONOSCOPY  2008   neg   DILATION AND CURETTAGE OF UTERUS     X2   KNEE ARTHROSCOPY W/ MENISCAL REPAIR  2008    Dr Adella Nissen knee   Ovarian cysts removed      Dr Warnell Forester   POLYPECTOMY  2003   polypoid colonic mucosa/ 2008 no polyps   THUMB SURGERY  1983   Left; post skiing accident in [place]   Occupational History   Occupation: Agricultural engineer  Tobacco Use   Smoking status: Never   Smokeless tobacco: Never  Substance and Sexual Activity   Alcohol use: Yes    Alcohol/week: 7.0 standard drinks    Types: 7 Glasses of wine per week   Drug use: No   Sexual activity: Yes    Birth control/protection: Post-menopausal

## 2021-02-02 ENCOUNTER — Encounter: Payer: Self-pay | Admitting: Orthopaedic Surgery

## 2021-02-03 ENCOUNTER — Other Ambulatory Visit: Payer: Self-pay

## 2021-02-03 DIAGNOSIS — M1711 Unilateral primary osteoarthritis, right knee: Secondary | ICD-10-CM

## 2021-02-03 NOTE — Telephone Encounter (Signed)
Order made

## 2021-02-11 ENCOUNTER — Ambulatory Visit
Admission: RE | Admit: 2021-02-11 | Discharge: 2021-02-11 | Disposition: A | Discharge status: Home / Self Care | Payer: Medicare Other | Source: Ambulatory Visit | Attending: Orthopaedic Surgery | Admitting: Orthopaedic Surgery

## 2021-02-11 ENCOUNTER — Other Ambulatory Visit: Payer: Self-pay

## 2021-02-11 DIAGNOSIS — M1711 Unilateral primary osteoarthritis, right knee: Secondary | ICD-10-CM

## 2021-02-11 DIAGNOSIS — M25561 Pain in right knee: Secondary | ICD-10-CM | POA: Diagnosis not present

## 2021-02-14 ENCOUNTER — Other Ambulatory Visit: Payer: Self-pay

## 2021-02-14 ENCOUNTER — Encounter: Payer: Self-pay | Admitting: Orthopaedic Surgery

## 2021-02-14 ENCOUNTER — Ambulatory Visit: Payer: Medicare Other | Admitting: Orthopaedic Surgery

## 2021-02-14 DIAGNOSIS — S83241A Other tear of medial meniscus, current injury, right knee, initial encounter: Secondary | ICD-10-CM

## 2021-02-14 DIAGNOSIS — M1711 Unilateral primary osteoarthritis, right knee: Secondary | ICD-10-CM | POA: Diagnosis not present

## 2021-02-14 NOTE — Progress Notes (Signed)
Office Visit Note   Patient: Alexandra Fletcher           Date of Birth: August 20, 1950           MRN: [id number] Visit Date: 02/14/2021              Requested by: Marin Olp, MD [place],  [place] [address] PCP: Marin Olp, MD   Assessment & Plan: Visit Diagnoses:  1. Acute medial meniscus tear of right knee, initial encounter   2. Primary osteoarthritis of right knee     Plan: Alexandra Fletcher returns today for discussion of MRI of the right knee.  Examination of the right knee shows medial joint line tenderness and positive [place].  MRI does show grade 4 changes the medial and patellofemoral compartments as well as a complex tear of the medial meniscus that has extruded into the medial gutter.  Given history of painful pop and injury in July I feel that her symptoms are more consistent with the meniscus tear although she understands that she does have grade 4 changes in the medial compartment.  We talked about the pros and cons of arthroscopic partial medial meniscectomy versus total knee replacement and associated rehab and recovery.  She understands that arthroscopic surgery has a risk of incomplete relief of pain.  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  She will think about her options and get back in touch with me in the near future.  Follow-Up Instructions: No follow-ups on file.   Orders:  No orders of the defined types were placed in this encounter.  No orders of the defined types were placed in this encounter.     Procedures: No procedures performed   Clinical Data: No additional findings.   Subjective: Chief Complaint  Patient presents with   Right Knee - Pain    HPI  Review of Systems   Objective: Vital Signs: LMP  (LMP Unknown)   Physical Exam  Ortho Exam  Specialty Comments:  No specialty comments  available.  Imaging: No results found.   PMFS History: Patient Active Problem List   Diagnosis Date Noted   Type 2 diabetes mellitus with peripheral vascular disease ([place]) 07/07/2019   Posterior tibial tendinitis of right lower extremity 04/01/2018   Dysphagia, pharyngoesophageal phase 02/21/2015   Diabetic retinopathy ([place]) 12/19/2014   History of CVA - Acute right arterial ischemic stroke, PCA (posterior cerebral artery) 01/04/2013   Eosinophilia 12/03/2011   Sinus tarsi syndrome 01/31/2011   Baker's cyst of knee 10/10/2010   Hyperlipidemia associated with type 2 diabetes mellitus ([place]) 08/16/2009   Hypertension associated with diabetes ([place]) 08/16/2009   History of colonic polyps 08/16/2009   HEART MURMUR, BENIGN 05/16/2007   History of abnormal cervical Pap smear 04/21/2007   Type 2 diabetes mellitus with ophthalmic complication ([place]) 01/77/9390   Past Medical History:  Diagnosis Date   Allergy    Arthritis    Cataract    mild x both eyes    Cerebral thrombosis with cerebral infarction [facility]) 2002   diagnosis needs confirmed by review of [place] records   Diabetes mellitus    Heart murmur    1x per Dr. Linna Darner- not recurrent   Hyperlipidemia    Hypertension    Partial rupture of Achilles tendon    After fluorquinolone use    Family History  Problem Relation Age of Onset   Liver disease Mother        ? malignancy after Hepatitis   Diabetes Father    Heart attack Father        CBAG @ 36   Multiple myeloma Father    Hyperlipidemia Father    Hypertension Father    Diabetes Brother        Type 1 DM   Heart attack Brother 71   Stroke Maternal Aunt        > 81   Breast cancer Cousin    Other Sister        died as child from pneumonia   Healthy Brother    Colon cancer Neg Hx    Colon polyps Neg Hx    Esophageal cancer Neg Hx    Rectal cancer Neg Hx    Stomach cancer Neg Hx     Past Surgical History:  Procedure Laterality Date   CATARACT EXTRACTION,  BILATERAL     COLONOSCOPY  2008   neg   DILATION AND CURETTAGE OF UTERUS     X2   KNEE ARTHROSCOPY W/ MENISCAL REPAIR  2008    Dr Adella Nissen knee   Ovarian cysts removed      Dr Warnell Forester   POLYPECTOMY  2003   polypoid colonic mucosa/ 2008 no polyps   THUMB SURGERY  1983   Left; post skiing accident in [place]   Occupational History   Occupation: Agricultural engineer  Tobacco Use   Smoking status: Never   Smokeless tobacco: Never  Substance and Sexual Activity   Alcohol use: Yes    Alcohol/week: 7.0 standard drinks    Types: 7 Glasses of wine per week   Drug use: No   Sexual activity: Yes    Birth control/protection: Post-menopausal

## 2021-02-15 ENCOUNTER — Encounter: Payer: Self-pay | Admitting: Orthopaedic Surgery

## 2021-03-06 ENCOUNTER — Other Ambulatory Visit: Payer: Self-pay | Admitting: Physician Assistant

## 2021-03-06 MED ORDER — ONDANSETRON HCL 4 MG PO TABS: 4.0000 mg | ORAL_TABLET | Freq: Three times a day (TID) | ORAL | 0 refills | Status: DC | PRN

## 2021-03-06 MED ORDER — HYDROCODONE-ACETAMINOPHEN 5-325 MG PO TABS
1.0000 | ORAL_TABLET | Freq: Three times a day (TID) | ORAL | 0 refills | Status: DC | PRN
Start: 2021-03-06 — End: 2021-07-25

## 2021-03-09 ENCOUNTER — Encounter: Payer: Self-pay | Admitting: Orthopaedic Surgery

## 2021-03-09 DIAGNOSIS — S83241A Other tear of medial meniscus, current injury, right knee, initial encounter: Secondary | ICD-10-CM | POA: Diagnosis not present

## 2021-03-09 DIAGNOSIS — M94261 Chondromalacia, right knee: Secondary | ICD-10-CM | POA: Diagnosis not present

## 2021-03-09 DIAGNOSIS — S83231A Complex tear of medial meniscus, current injury, right knee, initial encounter: Secondary | ICD-10-CM | POA: Diagnosis not present

## 2021-03-09 DIAGNOSIS — G8918 Other acute postprocedural pain: Secondary | ICD-10-CM | POA: Diagnosis not present

## 2021-03-19 ENCOUNTER — Encounter (HOSPITAL_COMMUNITY)
Admission: EM | Disposition: A | Discharge status: Home / Self Care | Payer: Self-pay | Source: Home / Self Care | Attending: Emergency Medicine

## 2021-03-19 ENCOUNTER — Emergency Department (HOSPITAL_COMMUNITY): Payer: Medicare Other | Admitting: Certified Registered Nurse Anesthetist

## 2021-03-19 ENCOUNTER — Emergency Department (HOSPITAL_COMMUNITY)
Admission: EM | Admit: 2021-03-19 | Discharge: 2021-03-19 | Disposition: A | Discharge status: Home / Self Care | Payer: Medicare Other | Source: Home / Self Care | Attending: Emergency Medicine | Admitting: Emergency Medicine

## 2021-03-19 ENCOUNTER — Encounter (HOSPITAL_COMMUNITY): Payer: Self-pay

## 2021-03-19 ENCOUNTER — Ambulatory Visit (HOSPITAL_BASED_OUTPATIENT_CLINIC_OR_DEPARTMENT_OTHER)
Admission: EM | Admit: 2021-03-19 | Discharge: 2021-03-19 | Disposition: A | Discharge status: Home / Self Care | Payer: Medicare Other | Attending: Gastroenterology | Admitting: Gastroenterology

## 2021-03-19 ENCOUNTER — Other Ambulatory Visit: Payer: Self-pay

## 2021-03-19 ENCOUNTER — Encounter (HOSPITAL_BASED_OUTPATIENT_CLINIC_OR_DEPARTMENT_OTHER): Payer: Self-pay | Admitting: Emergency Medicine

## 2021-03-19 DIAGNOSIS — K2940 Chronic atrophic gastritis without bleeding: Secondary | ICD-10-CM | POA: Diagnosis not present

## 2021-03-19 DIAGNOSIS — D721 Eosinophilia, unspecified: Secondary | ICD-10-CM | POA: Diagnosis not present

## 2021-03-19 DIAGNOSIS — R1310 Dysphagia, unspecified: Secondary | ICD-10-CM | POA: Insufficient documentation

## 2021-03-19 DIAGNOSIS — I10 Essential (primary) hypertension: Secondary | ICD-10-CM | POA: Diagnosis not present

## 2021-03-19 DIAGNOSIS — K269 Duodenal ulcer, unspecified as acute or chronic, without hemorrhage or perforation: Secondary | ICD-10-CM | POA: Insufficient documentation

## 2021-03-19 DIAGNOSIS — Y849 Medical procedure, unspecified as the cause of abnormal reaction of the patient, or of later complication, without mention of misadventure at the time of the procedure: Secondary | ICD-10-CM | POA: Insufficient documentation

## 2021-03-19 DIAGNOSIS — K222 Esophageal obstruction: Secondary | ICD-10-CM | POA: Insufficient documentation

## 2021-03-19 DIAGNOSIS — Z79899 Other long term (current) drug therapy: Secondary | ICD-10-CM | POA: Insufficient documentation

## 2021-03-19 DIAGNOSIS — Z7982 Long term (current) use of aspirin: Secondary | ICD-10-CM | POA: Insufficient documentation

## 2021-03-19 DIAGNOSIS — Z7984 Long term (current) use of oral hypoglycemic drugs: Secondary | ICD-10-CM | POA: Diagnosis not present

## 2021-03-19 DIAGNOSIS — T18128A Food in esophagus causing other injury, initial encounter: Secondary | ICD-10-CM | POA: Insufficient documentation

## 2021-03-19 DIAGNOSIS — K2100 Gastro-esophageal reflux disease with esophagitis, without bleeding: Secondary | ICD-10-CM | POA: Diagnosis not present

## 2021-03-19 DIAGNOSIS — K449 Diaphragmatic hernia without obstruction or gangrene: Secondary | ICD-10-CM | POA: Diagnosis not present

## 2021-03-19 DIAGNOSIS — K208 Other esophagitis without bleeding: Secondary | ICD-10-CM | POA: Diagnosis not present

## 2021-03-19 DIAGNOSIS — X58XXXA Exposure to other specified factors, initial encounter: Secondary | ICD-10-CM | POA: Insufficient documentation

## 2021-03-19 DIAGNOSIS — Z20822 Contact with and (suspected) exposure to covid-19: Secondary | ICD-10-CM | POA: Diagnosis not present

## 2021-03-19 DIAGNOSIS — K2289 Other specified disease of esophagus: Secondary | ICD-10-CM | POA: Insufficient documentation

## 2021-03-19 DIAGNOSIS — T18108A Unspecified foreign body in esophagus causing other injury, initial encounter: Secondary | ICD-10-CM | POA: Diagnosis not present

## 2021-03-19 HISTORY — PX: ESOPHAGOGASTRODUODENOSCOPY: SHX5428

## 2021-03-19 HISTORY — PX: FOREIGN BODY REMOVAL: SHX962

## 2021-03-19 HISTORY — PX: BIOPSY: SHX5522

## 2021-03-19 LAB — BASIC METABOLIC PANEL
Anion gap: 11 (ref 5–15)
BUN: 18 mg/dL (ref 8–23)
CO2: 26 mmol/L (ref 22–32)
Calcium: 9.5 mg/dL (ref 8.9–10.3)
Chloride: 101 mmol/L (ref 98–111)
Creatinine, Ser: 0.8 mg/dL (ref 0.44–1.00)
GFR, Estimated: >60 mL/min (ref >60)
Glucose, Bld: 218 mg/dL — ABNORMAL HIGH (ref 70–99)
Potassium: 3.5 mmol/L (ref 3.5–5.1)
Sodium: 138 mmol/L (ref 135–145)

## 2021-03-19 LAB — CBC WITH DIFFERENTIAL/PLATELET
Abs Immature Granulocytes: 0.02 10*3/uL (ref 0.00–0.07)
Basophils Absolute: 0.1 10*3/uL (ref 0.0–0.1)
Basophils Relative: 1 %
Eosinophils Absolute: 0.6 10*3/uL — ABNORMAL HIGH (ref 0.0–0.5)
Eosinophils Relative: 7 %
HCT: 40.2 % (ref 36.0–46.0)
Hemoglobin: 13.6 g/dL (ref 12.0–15.0)
Immature Granulocytes: 0 %
Lymphocytes Relative: 21 %
Lymphs Abs: 1.9 10*3/uL (ref 0.7–4.0)
MCH: 29.8 pg (ref 26.0–34.0)
MCHC: 33.8 g/dL (ref 30.0–36.0)
MCV: 88 fL (ref 80.0–100.0)
Monocytes Absolute: 0.6 10*3/uL (ref 0.1–1.0)
Monocytes Relative: 7 %
Neutro Abs: 5.7 10*3/uL (ref 1.7–7.7)
Neutrophils Relative %: 64 %
Platelets: 269 10*3/uL (ref 150–400)
RBC: 4.57 MIL/uL (ref 3.87–5.11)
RDW: 13.1 % (ref 11.5–15.5)
WBC: 8.9 10*3/uL (ref 4.0–10.5)
nRBC: 0 % (ref 0.0–0.2)

## 2021-03-19 LAB — RESP PANEL BY RT-PCR (FLU A&B, COVID) ARPGX2
Influenza A by PCR: NEGATIVE
Influenza B by PCR: NEGATIVE
SARS Coronavirus 2 by RT PCR: NEGATIVE

## 2021-03-19 LAB — GLUCOSE, CAPILLARY
Glucose-Capillary: 268 mg/dL — ABNORMAL HIGH (ref 70–99)
Glucose-Capillary: 233 mg/dL — ABNORMAL HIGH (ref 70–99)

## 2021-03-19 SURGERY — EGD (ESOPHAGOGASTRODUODENOSCOPY)
Anesthesia: General

## 2021-03-19 MED ORDER — ACETAMINOPHEN 325 MG PO TABS
325.0000 mg | ORAL_TABLET | ORAL | Status: DC | PRN
  Filled 2021-03-19: qty 2

## 2021-03-19 MED ORDER — OXYCODONE HCL 5 MG PO TABS: 5.0000 mg | ORAL_TABLET | Freq: Once | ORAL | Status: DC | PRN

## 2021-03-19 MED ORDER — PROPOFOL 10 MG/ML IV BOLUS
INTRAVENOUS | Status: DC | PRN
  Administered 2021-03-19: 200 mg via INTRAVENOUS

## 2021-03-19 MED ORDER — ESMOLOL HCL 100 MG/10ML IV SOLN
INTRAVENOUS | Status: DC | PRN
  Administered 2021-03-19: 50 mg via INTRAVENOUS

## 2021-03-19 MED ORDER — SODIUM CHLORIDE 0.9 % IV SOLN: INTRAVENOUS | Status: DC

## 2021-03-19 MED ORDER — ONDANSETRON HCL 4 MG/2ML IJ SOLN
INTRAMUSCULAR | Status: DC | PRN
Start: 2021-03-19 — End: 2021-03-19
  Administered 2021-03-19: 4 mg via INTRAVENOUS

## 2021-03-19 MED ORDER — LIDOCAINE HCL (PF) 2 % IJ SOLN
INTRAMUSCULAR | Status: AC
  Filled 2021-03-19: qty 5

## 2021-03-19 MED ORDER — PROPOFOL 10 MG/ML IV BOLUS
INTRAVENOUS | Status: AC
  Filled 2021-03-19: qty 20

## 2021-03-19 MED ORDER — SUCCINYLCHOLINE CHLORIDE 200 MG/10ML IV SOSY
PREFILLED_SYRINGE | INTRAVENOUS | Status: DC | PRN
Start: 2021-03-19 — End: 2021-03-19
  Administered 2021-03-19: 140 mg via INTRAVENOUS

## 2021-03-19 MED ORDER — LACTATED RINGERS IV SOLN
INTRAVENOUS | Status: AC | PRN
  Administered 2021-03-19: 10 mL/h via INTRAVENOUS

## 2021-03-19 MED ORDER — ONDANSETRON HCL 4 MG/2ML IJ SOLN
INTRAMUSCULAR | Status: AC
  Filled 2021-03-19: qty 2

## 2021-03-19 MED ORDER — ACETAMINOPHEN 160 MG/5ML PO SOLN
325.0000 mg | ORAL | Status: DC | PRN
  Filled 2021-03-19: qty 20.3

## 2021-03-19 MED ORDER — LIDOCAINE 2% (20 MG/ML) 5 ML SYRINGE
INTRAMUSCULAR | Status: DC | PRN
  Administered 2021-03-19: 100 mg via INTRAVENOUS

## 2021-03-19 MED ORDER — DEXAMETHASONE SODIUM PHOSPHATE 10 MG/ML IJ SOLN
INTRAMUSCULAR | Status: DC | PRN
  Administered 2021-03-19: 4 mg via INTRAVENOUS

## 2021-03-19 MED ORDER — ONDANSETRON HCL 4 MG/2ML IJ SOLN
4.0000 mg | Freq: Once | INTRAMUSCULAR | Status: AC
  Administered 2021-03-19: 4 mg via INTRAVENOUS
  Filled 2021-03-19: qty 2

## 2021-03-19 MED ORDER — ONDANSETRON HCL 4 MG/2ML IJ SOLN: 4.0000 mg | Freq: Once | INTRAMUSCULAR | Status: DC | PRN

## 2021-03-19 MED ORDER — PROPOFOL 500 MG/50ML IV EMUL
INTRAVENOUS | Status: DC | PRN
  Administered 2021-03-19: 150 ug/kg/min via INTRAVENOUS

## 2021-03-19 MED ORDER — GLUCAGON HCL RDNA (DIAGNOSTIC) 1 MG IJ SOLR
1.0000 mg | Freq: Once | INTRAMUSCULAR | Status: AC
  Administered 2021-03-19: 1 mg via INTRAVENOUS
  Filled 2021-03-19: qty 1

## 2021-03-19 MED ORDER — DIAZEPAM 5 MG/ML IJ SOLN
2.5000 mg | Freq: Once | INTRAMUSCULAR | Status: AC
  Administered 2021-03-19: 2.5 mg via INTRAVENOUS
  Filled 2021-03-19: qty 2

## 2021-03-19 MED ORDER — DEXAMETHASONE SODIUM PHOSPHATE 10 MG/ML IJ SOLN
INTRAMUSCULAR | Status: AC
  Filled 2021-03-19: qty 1

## 2021-03-19 MED ORDER — KETOROLAC TROMETHAMINE 30 MG/ML IJ SOLN
INTRAMUSCULAR | Status: AC
  Filled 2021-03-19: qty 3

## 2021-03-19 MED ORDER — GLUCAGON HCL RDNA (DIAGNOSTIC) 1 MG IJ SOLR
1.0000 mg | Freq: Once | INTRAMUSCULAR | Status: AC
Start: 2021-03-19 — End: 2021-03-19
  Administered 2021-03-19: 1 mg via INTRAVENOUS
  Filled 2021-03-19: qty 1

## 2021-03-19 MED ORDER — OXYCODONE HCL 5 MG/5ML PO SOLN: 5.0000 mg | Freq: Once | ORAL | Status: DC | PRN

## 2021-03-19 NOTE — ED Triage Notes (Signed)
Presents from home for chicken stuck in throat since dinner last night. Has experienced this before but was eventually able to cough up the food. No diagnosed reason for previous occurrence. H/o Dm, elevated eosinophils, htn

## 2021-03-19 NOTE — Op Note (Signed)
[facility] Patient Name: Alexandra Fletcher Procedure Date: 03/19/2021 MRN: [id number] Attending MD: Clarene Essex , MD Date of Birth: [birth year] CSN: [id number] Age: 71 Admit Type: Outpatient Procedure:                Upper GI endoscopy Indications:              Dysphagia, Foreign body in the esophagus Providers:                Clarene Essex, MD, Jeanella Cara, RN,                            William Dalton, Technician Referring MD:              Medicines:                General Anesthesia Complications:            No immediate complications. Estimated Blood Loss:     Estimated blood loss: none. Procedure:                Pre-Anesthesia Assessment:                           - Prior to the procedure, a History and Physical                            was performed, and patient medications and                            allergies were reviewed. The patient's tolerance of                            previous anesthesia was also reviewed. The risks                            and benefits of the procedure and the sedation                            options and risks were discussed with the patient.                            All questions were answered, and informed consent                            was obtained. Prior Anticoagulants: The patient has                            taken no previous anticoagulant or antiplatelet                            agents except for aspirin. ASA Grade Assessment:                            III - A patient with severe systemic disease. After  reviewing the risks and benefits, the patient was                            deemed in satisfactory condition to undergo the                            procedure.                           After obtaining informed consent, the endoscope was                            passed under direct vision. Throughout the                            procedure, the patient's blood  pressure, pulse, and                            oxygen saturations were monitored continuously. The                            [id number]-[id number] ([id number] Olympus endoscope was introduced                            through the mouth, and advanced to the second part                            of duodenum. The upper GI endoscopy was                            accomplished without difficulty. The patient                            tolerated the procedure well. Scope In: Scope Out: Findings:      Food was found in the middle third of the esophagus. Removal of food was       accomplished.      A medium-sized hiatal hernia was present.      Moderately severe esophagitis with no bleeding was found.      Mucosal changes including ringed esophagus were found in the upper third       of the esophagus and in the middle third of the esophagus. Biopsies were       taken with a cold forceps for histology. Questionably suspicious for       eosinophilic esophagitis      Localized mild inflammation characterized by congestion (edema) was       found in the gastric antrum.      Two non-bleeding superficial small duodenal ulcers with no stigmata of       bleeding were found in the duodenal bulb.      The second portion of the duodenum was normal.      The exam was otherwise without abnormality. Impression:               - Food in the middle third of the esophagus.  Removal was successful.                           - Medium-sized hiatal hernia.                           - Moderately severe reflux and erosive esophagitis                            with no bleeding.                           - Esophageal mucosal changes suspicious for                            eosinophilic esophagitis. Biopsied.                           - Chronic atrophic gastritis.                           - Non-bleeding duodenal ulcers with no stigmata of                            bleeding.                            - Normal second portion of the duodenum.                           - The examination was otherwise normal. Moderate Sedation:      Not Applicable - Patient had care per Anesthesia. Recommendation:           - Patient has a contact number available for                            emergencies. The signs and symptoms of potential                            delayed complications were discussed with the                            patient. Return to normal activities tomorrow.                            Written discharge instructions were provided to the                            patient.                           - Clear liquid diet for 2 hours. If doing well may                            have soft solids today                           -  No aspirin, ibuprofen, naproxen, or other                            non-steroidal anti-inflammatory drugs for 1 week.                           - Await pathology results.                           - Use Prilosec OTC 20 mg PO twice a day for 2                            weeks. If helping call for prescription strength                            otherwise will discuss on follow-up when we review                            the biopsies                           - Return to GI clinic in 4 weeks.                           - Telephone GI clinic for pathology results in 1                            week.                           - Telephone GI clinic if symptomatic PRN. Procedure Code(s):        --- Professional ---                           [id number], Esophagogastroduodenoscopy, flexible,                            transoral; with removal of foreign body(s)                           43239, Esophagogastroduodenoscopy, flexible,                            transoral; with biopsy, single or multiple Diagnosis Code(s):        --- Professional ---                           C94.709G, Food in esophagus causing other injury,                            initial encounter                            K44.9, Diaphragmatic hernia without obstruction or  gangrene                           K21.00, Gastro-esophageal reflux disease with                            esophagitis, without bleeding                           K20.80, Other esophagitis without bleeding                           K22.8, Other specified diseases of esophagus                           K29.40, Chronic atrophic gastritis without bleeding                           K26.9, Duodenal ulcer, unspecified as acute or                            chronic, without hemorrhage or perforation                           R13.10, Dysphagia, unspecified                           T18.108A, Unspecified foreign body in esophagus                            causing other injury, initial encounter CPT copyright 2019 [employer] Medical Association. All rights reserved. The codes documented in this report are preliminary and upon coder review may  be revised to meet current compliance requirements. Clarene Essex, MD 03/19/2021 10:32:23 AM This report has been signed electronically. Number of Addenda: 0

## 2021-03-19 NOTE — ED Notes (Signed)
Report given to Maylon Cos, Agricultural consultant at [employer]. IV secured. Instructions given not to make stops along the way.

## 2021-03-19 NOTE — ED Provider Notes (Signed)
[place] EMERGENCY DEPT Provider Note   CSN: [id number] Arrival date & time: 03/19/21  0324     History  Chief Complaint  Patient presents with   Dysphagia    Alexandra Fletcher is a 71 y.o. female.  Patient presents with sensation of food being stuck in her throat and chest.  States she was eating boneless chicken for dinner and felt something get stuck in her mid chest area.  Since then she has had difficulty swallowing her saliva and had several episodes of vomiting.  No difficulty breathing.  No chest pain.  No abdominal pain.  She has had this happen in the past but never needed intervention for it.  She has never had an EGD. She attempted to have a carbonated beverage at home but that came back up as well and she was unable to get comfortable due to not being able to swallow her saliva.  She has been told she had elevated eosinophils in the past but never been diagnosed with esophagitis or ulcers.  The history is provided by the patient and the spouse.      Home Medications Prior to Admission medications   Medication Sig Start Date End Date Taking? Authorizing Provider  HYDROcodone-acetaminophen (NORCO) 5-325 MG tablet Take 1-2 tablets by mouth 3 (three) times daily as needed. To be taken after surgery 03/06/21   Aundra Dubin, PA-C  ondansetron (ZOFRAN) 4 MG tablet Take 1 tablet (4 mg total) by mouth every 8 (eight) hours as needed for nausea or vomiting. 03/06/21   Aundra Dubin, PA-C  amLODipine (NORVASC) 10 MG tablet TAKE 1 TABLET (10 MG TOTAL) BY MOUTH DAILY. 01/12/21 01/12/22  Marin Olp, MD  aspirin EC 81 MG tablet Take 81 mg by mouth daily.    [provider]  atorvastatin (LIPITOR) 20 MG tablet Take 1 tablet (20 mg total) by mouth 4 (four) times a week. 01/24/21   Marin Olp, MD  Blood Glucose Monitoring Suppl (ONE TOUCH ULTRA 2) w/Device KIT USE TO TEST BLOOD SUGAR TWO TO THREE TIMES DAILY 11/19/18   Marin Olp,  MD  carvedilol (COREG) 6.25 MG tablet Take 1 tablet (6.25 mg total) by mouth 2 (two) times daily. 01/12/21   Marin Olp, MD  glipiZIDE (GLUCOTROL XL) 10 MG 24 hr tablet TAKE 1 TABLET BY MOUTH EVERY DAY 01/12/21 01/12/22  Marin Olp, MD  glucose blood (ONETOUCH ULTRA) test strip USE TO TEST BLOOD GLUCOSE 2 TO 3 TIMES A DAY 12/12/20 12/09/21  Marin Olp, MD  metFORMIN (GLUCOPHAGE) 1000 MG tablet TAKE 1 TABLET BY MOUTH IN THE MORNING, 1/2 TABLET AT LUNCH AND 1 TABLET AT DINNER. 01/12/21 01/12/22  Marin Olp, MD  ondansetron (ZOFRAN ODT) 4 MG disintegrating tablet Take 1 tablet (4 mg total) by mouth every 8 (eight) hours as needed for nausea or vomiting. 12/31/20   Pearson Forster, NP  OneTouch Delica Lancets 88Q MISC USE TO TEST BLOOD SUGAR TWO TO THREE TIMES DAILY 11/19/18   Marin Olp, MD  sitaGLIPtin (JANUVIA) 100 MG tablet Take 1 tablet (100 mg total) by mouth daily. 01/19/21   Marin Olp, MD  vitamin B-12 (CYANOCOBALAMIN) 100 MCG tablet     [provider]      Allergies    Lisinopril, Moxifloxacin, and Pioglitazone    Review of Systems   Review of Systems  Constitutional:  Negative for activity change, appetite change and fever.  HENT:  Positive  for trouble swallowing. Negative for congestion.   Respiratory:  Negative for cough, chest tightness and shortness of breath.   Cardiovascular:  Negative for chest pain.  Gastrointestinal:  Positive for nausea and vomiting. Negative for abdominal pain.  Genitourinary:  Negative for dysuria and hematuria.  Musculoskeletal:  Negative for arthralgias.  Skin:  Negative for wound.  Neurological:  Negative for dizziness, weakness and headaches.   all other systems are negative except as noted in the HPI and PMH.   Physical Exam Updated Vital Signs BP (!) 163/99 (BP Location: Right Arm)    Pulse (!) 107    Temp 98.7 F (37.1 C) (Oral)    Resp 18    Wt 75.8 kg    LMP  (LMP Unknown)    SpO2 98%    BMI  29.12 kg/m  Physical Exam Vitals and nursing note reviewed.  Constitutional:      General: She is not in acute distress.    Appearance: She is well-developed.     Comments: No distress, spitting in emesis basin  HENT:     Head: Normocephalic and atraumatic.     Mouth/Throat:     Pharynx: No oropharyngeal exudate.     Comments: Oropharynx appears clear Eyes:     Conjunctiva/sclera: Conjunctivae normal.     Pupils: Pupils are equal, round, and reactive to light.  Neck:     Comments: No meningismus. Cardiovascular:     Rate and Rhythm: Normal rate and regular rhythm.     Heart sounds: Normal heart sounds. No murmur heard. Pulmonary:     Effort: Pulmonary effort is normal. No respiratory distress.     Breath sounds: Normal breath sounds.  Abdominal:     Palpations: Abdomen is soft.     Tenderness: There is no abdominal tenderness. There is no guarding or rebound.  Musculoskeletal:        General: No tenderness. Normal range of motion.     Cervical back: Normal range of motion and neck supple.  Skin:    General: Skin is warm.  Neurological:     Mental Status: She is alert and oriented to person, place, and time.     Cranial Nerves: No cranial nerve deficit.     Motor: No abnormal muscle tone.     Coordination: Coordination normal.     Comments:  5/5 strength throughout. CN 2-12 intact.Equal grip strength.   Psychiatric:        Behavior: Behavior normal.    ED Results / Procedures / Treatments   Labs (all labs ordered are listed, but only abnormal results are displayed) Labs Reviewed  CBC WITH DIFFERENTIAL/PLATELET - Abnormal; Notable for the following components:      Result Value   Eosinophils Absolute 0.6 (*)    All other components within normal limits  BASIC METABOLIC PANEL - Abnormal; Notable for the following components:   Glucose, Bld 218 (*)    All other components within normal limits    EKG None  Radiology No results found.  Procedures Procedures     Medications Ordered in ED Medications  glucagon (human recombinant) (GLUCAGEN) injection 1 mg (has no administration in time range)  diazepam (VALIUM) injection 2.5 mg (has no administration in time range)  ondansetron (ZOFRAN) injection 4 mg (has no administration in time range)    ED Course/ Medical Decision Making/ A&P  Medical Decision Making  Suspected esophageal food impaction.  History of similar symptoms in the past without intervention.  Will give antiemetics, muscle relaxers including glucagon.  Unsuccessful attempts at smooth muscle relaxation with medications.  Patient still spitting and dry heaving and unable to tolerate p.o. Attempted also jumping on heels maneuver which patient had difficulty with given her recent knee surgery  Suspect she will need EGD and possible esophageal dilation and foreign body removal.  Discussed with on-call gastroenterology Dr. Watt Climes.  He is requesting transfer to [facility] ED for evaluation.  Patient's husband will drive her directly to the ED.  She is told not to eat or drink anything on the way.  They were told to go directly to the ED without stopping anywhere.  Dr. Christy Gentles alerted at [facility] ED.        Final Clinical Impression(s) / ED Diagnoses Final diagnoses:  Food impaction of esophagus, initial encounter    Rx / DC Orders ED Discharge Orders     None         Franciso Dierks, Annie Main, MD 03/19/21 [phone]

## 2021-03-19 NOTE — Transfer of Care (Signed)
Immediate Anesthesia Transfer of Care Note  Patient: Alexandra Fletcher  Procedure(s) Performed: ESOPHAGOGASTRODUODENOSCOPY (EGD) FOREIGN BODY REMOVAL BIOPSY  Patient Location: PACU  Anesthesia Type:General  Level of Consciousness: awake  Airway & Oxygen Therapy: Patient Spontanous Breathing and Patient connected to face mask oxygen  Post-op Assessment: Report given to RN and Post -op Vital signs reviewed and stable  Post vital signs: Reviewed and stable  Last Vitals:  Vitals Value Taken Time  BP 112/60 03/19/21 1025  Temp 37 C 03/19/21 1025  Pulse 88 03/19/21 1029  Resp 15 03/19/21 1029  SpO2 98 % 03/19/21 1029  Vitals shown include unvalidated device data.  Last Pain:  Vitals:   03/19/21 1025  TempSrc:   PainSc: 0-No pain         Complications: No notable events documented.

## 2021-03-19 NOTE — Anesthesia Procedure Notes (Addendum)
Procedure Name: Intubation Date/Time: 03/19/2021 10:02 AM Performed by: Milford Cage, CRNA Pre-anesthesia Checklist: Patient identified, Emergency Drugs available, Suction available and Patient being monitored Patient Re-evaluated:Patient Re-evaluated prior to induction Oxygen Delivery Method: Circle system utilized Preoxygenation: Pre-oxygenation with 100% oxygen Induction Type: IV induction, Rapid sequence and Cricoid Pressure applied Laryngoscope Size: Miller and 2 Grade View: Grade I Tube type: Oral Tube size: 7.5 mm Number of attempts: 1 Airway Equipment and Method: Stylet Placement Confirmation: ETT inserted through vocal cords under direct vision, positive ETCO2 and breath sounds checked- equal and bilateral Secured at: 21 cm Tube secured with: Tape Dental Injury: Teeth and Oropharynx as per pre-operative assessment

## 2021-03-19 NOTE — Anesthesia Preprocedure Evaluation (Signed)
Anesthesia Evaluation  Patient identified by MRN, date of birth, ID band Patient awake    Reviewed: Allergy & Precautions, NPO status , Patient's Chart, lab work & pertinent test results, reviewed documented beta blocker date and time   Airway Mallampati: I       Dental no notable dental hx.    Pulmonary neg pulmonary ROS,    Pulmonary exam normal        Cardiovascular hypertension, Pt. on home beta blockers and Pt. on medications Normal cardiovascular exam     Neuro/Psych negative psych ROS   GI/Hepatic Neg liver ROS,   Endo/Other  diabetes, Type 2, Oral Hypoglycemic Agents  Renal/GU negative Renal ROS  negative genitourinary   Musculoskeletal   Abdominal Normal abdominal exam  (+)   Peds  Hematology negative hematology ROS (+)   Anesthesia Other Findings   Reproductive/Obstetrics                             Anesthesia Physical Anesthesia Plan  ASA: 2  Anesthesia Plan: General   Post-op Pain Management:    Induction: Intravenous  PONV Risk Score and Plan: Ondansetron, Dexamethasone and Midazolam  Airway Management Planned: Oral ETT  Additional Equipment: None  Intra-op Plan:   Post-operative Plan: Extubation in OR  Informed Consent: I have reviewed the patients History and Physical, chart, labs and discussed the procedure including the risks, benefits and alternatives for the proposed anesthesia with the patient or authorized representative who has indicated his/her understanding and acceptance.     Dental advisory given  Plan Discussed with: CRNA  Anesthesia Plan Comments:         Anesthesia Quick Evaluation

## 2021-03-19 NOTE — ED Triage Notes (Signed)
Pt states she was here earlier today for food stuck in her throat. Pt states she had an endoscopy today and the foot was removed. Pt states she was told to eat only soft foods when discharged, pt ate a noodle and states its currently stuck in her throat.

## 2021-03-19 NOTE — Discharge Instructions (Addendum)
Go directly to the [facility] emergency department.  Do not stop anywhere on the way.  Did not eat or drink anything.  You will be seen by Dr. Watt Climes this morning.  Clear liquid diet for 2 hours and if doing well may have soft solids today but chew your food well eat slowly and plenty of liquids cut food into small pieces and buy some over-the-counter Prilosec Nexium or generic and take 1 twice a day until we talk about the biopsies in 1 week and call me sooner if question or problem and based on how you are doing in 1 week when we review the biopsies will call you in a prescription and set up follow-up or you can follow-up with your primary lobe our gastroenterology

## 2021-03-19 NOTE — Consult Note (Signed)
Reason for Consult: Food impaction Referring Physician: ER physician  Alexandra Fletcher is an 71 y.o. female.  HPI: Patient seen and examined in hospital computer chart reviewed and case discussed with the ER physician and she has had problems swallowing for years but has always been able to get it up herself until last night while eating chicken she was unable to and although she felt better this morning in the ER she was unable to drink liquids and we were asked to proceed with endoscopy and her son does have eosinophilic esophagitis which we discussed and she has never had an endoscopy or previous work-up and her previous colonoscopies were reviewed and she has no other complaints  Past Medical History:  Diagnosis Date   Allergy    Arthritis    Cataract    mild x both eyes    Cerebral thrombosis with cerebral infarction [facility]) 2002   diagnosis needs confirmed by review of [facility]   Diabetes mellitus    Heart murmur    1x per Dr. Linna Darner- not recurrent   Hyperlipidemia    Hypertension    Partial rupture of Achilles tendon    After fluorquinolone use    Past Surgical History:  Procedure Laterality Date   CATARACT EXTRACTION, BILATERAL     COLONOSCOPY  2008   neg   DILATION AND CURETTAGE OF UTERUS     X2   KNEE ARTHROSCOPY W/ MENISCAL REPAIR  2008    Dr Adella Nissen knee   Ovarian cysts removed      Dr Warnell Forester   POLYPECTOMY  2003   polypoid colonic mucosa/ 2008 no polyps   THUMB SURGERY  1983   Left; post skiing accident in [place]    Family History  Problem Relation Age of Onset   Liver disease Mother        ? malignancy after Hepatitis   Diabetes Father    Heart attack Father        CBAG @ 37   Multiple myeloma Father    Hyperlipidemia Father    Hypertension Father    Diabetes Brother        Type 1 DM   Heart attack Brother 43   Stroke Maternal Aunt        > 64   Breast cancer Cousin    Other Sister        died as child from pneumonia    Healthy Brother    Colon cancer Neg Hx    Colon polyps Neg Hx    Esophageal cancer Neg Hx    Rectal cancer Neg Hx    Stomach cancer Neg Hx     Social History:  reports that she has never smoked. She has never used smokeless tobacco. She reports current alcohol use of about 7.0 standard drinks per week. She reports that she does not use drugs.  Allergies:  Allergies  Allergen Reactions   Lisinopril     Swelling of tongue   Moxifloxacin     Achilles tendon rupture potentially   Pioglitazone     REACTION: weight gain & ankle edema    Medications: I have reviewed the patient's current medications.  Results for orders placed or performed during the hospital encounter of 03/19/21 (from the past 48 hour(s))  CBC with Differential/Platelet     Status: Abnormal   Collection Time: 03/19/21  3:46 AM  Result Value Ref Range   WBC 8.9 4.0 - 10.5 K/uL   RBC 4.57 3.87 -  5.11 MIL/uL   Hemoglobin 13.6 12.0 - 15.0 g/dL   HCT 40.2 36.0 - 46.0 %   MCV 88.0 80.0 - 100.0 fL   MCH 29.8 26.0 - 34.0 pg   MCHC 33.8 30.0 - 36.0 g/dL   RDW 13.1 11.5 - 15.5 %   Platelets 269 150 - 400 K/uL   nRBC 0.0 0.0 - 0.2 %   Neutrophils Relative % 64 %   Neutro Abs 5.7 1.7 - 7.7 K/uL   Lymphocytes Relative 21 %   Lymphs Abs 1.9 0.7 - 4.0 K/uL   Monocytes Relative 7 %   Monocytes Absolute 0.6 0.1 - 1.0 K/uL   Eosinophils Relative 7 %   Eosinophils Absolute 0.6 (H) 0.0 - 0.5 K/uL   Basophils Relative 1 %   Basophils Absolute 0.1 0.0 - 0.1 K/uL   Immature Granulocytes 0 %   Abs Immature Granulocytes 0.02 0.00 - 0.07 K/uL    Comment: Performed at [facility], [address], [place], [place] [address]  Basic metabolic panel     Status: Abnormal   Collection Time: 03/19/21  3:46 AM  Result Value Ref Range   Sodium 138 135 - 145 mmol/L   Potassium 3.5 3.5 - 5.1 mmol/L   Chloride 101 98 - 111 mmol/L   CO2 26 22 - 32 mmol/L   Glucose, Bld 218 (H) 70 - 99 mg/dL    Comment: Glucose  reference range applies only to samples taken after fasting for at least 8 hours.   BUN 18 8 - 23 mg/dL   Creatinine, Ser 0.80 0.44 - 1.00 mg/dL   Calcium 9.5 8.9 - 10.3 mg/dL   GFR, Estimated >60 >60 mL/min    Comment: (NOTE) Calculated using the CKD-EPI Creatinine Equation (2021)    Anion gap 11 5 - 15    Comment: Performed at [facility], [address] [place], [place] [address]  Resp Panel by RT-PCR (Flu A&B, Covid) Nasopharyngeal Swab     Status: None   Collection Time: 03/19/21  7:43 AM   Specimen: Nasopharyngeal Swab; Nasopharyngeal(NP) swabs in vial transport medium  Result Value Ref Range   SARS Coronavirus 2 by RT PCR NEGATIVE NEGATIVE    Comment: (NOTE) SARS-CoV-2 target nucleic acids are NOT DETECTED.  The SARS-CoV-2 RNA is generally detectable in upper respiratory specimens during the acute phase of infection. The lowest concentration of SARS-CoV-2 viral copies this assay can detect is 138 copies/mL. A negative result does not preclude SARS-Cov-2 infection and should not be used as the sole basis for treatment or other patient management decisions. A negative result may occur with  improper specimen collection/handling, submission of specimen other than nasopharyngeal swab, presence of viral mutation(s) within the areas targeted by this assay, and inadequate number of viral copies(<138 copies/mL). A negative result must be combined with clinical observations, patient history, and epidemiological information. The expected result is Negative.  Fact Sheet for Patients:  EntrepreneurPulse.com.au  Fact Sheet for Healthcare Providers:  IncredibleEmployment.be  This test is no t yet approved or cleared by the [place] FDA and  has been authorized for detection and/or diagnosis of SARS-CoV-2 by FDA under an Emergency Use Authorization (EUA). This EUA will remain  in effect (meaning this test can be used) for  the duration of the COVID-19 declaration under Section 564(b)(1) of the Act, 21 U.S.C.section 360bbb-3(b)(1), unless the authorization is terminated  or revoked sooner.       Influenza A by PCR NEGATIVE NEGATIVE  Influenza B by PCR NEGATIVE NEGATIVE    Comment: (NOTE) The Xpert Xpress SARS-CoV-2/FLU/RSV plus assay is intended as an aid in the diagnosis of influenza from Nasopharyngeal swab specimens and should not be used as a sole basis for treatment. Nasal washings and aspirates are unacceptable for Xpert Xpress SARS-CoV-2/FLU/RSV testing.  Fact Sheet for Patients: EntrepreneurPulse.com.au  Fact Sheet for Healthcare Providers: IncredibleEmployment.be  This test is not yet approved or cleared by the [place] FDA and has been authorized for detection and/or diagnosis of SARS-CoV-2 by FDA under an Emergency Use Authorization (EUA). This EUA will remain in effect (meaning this test can be used) for the duration of the COVID-19 declaration under Section 564(b)(1) of the Act, 21 U.S.C. section 360bbb-3(b)(1), unless the authorization is terminated or revoked.  Performed at [facility], [place] [address], [place], [place] [address]   Glucose, capillary     Status: Abnormal   Collection Time: 03/19/21  9:52 AM  Result Value Ref Range   Glucose-Capillary 268 (H) 70 - 99 mg/dL    Comment: Glucose reference range applies only to samples taken after fasting for at least 8 hours.    No results found.  Review of Systems negative except above Blood pressure (!) 195/99, pulse 75, temperature 98.2 F (36.8 C), temperature source Oral, resp. rate (!) 23, height 5' 3.5" (1.613 m), weight 75.8 kg, SpO2 99 %. Physical Exam vital signs stable afebrile no acute distress in good spirits exam please see preassessment evaluation labs okay  Assessment/Plan: Food impaction in patient with longstanding swallowing issues Plan: The risk  benefits methods of endoscopy was discussed and we answered all of her questions and we compared an upper endoscopy to the colonoscopy she has had and will proceed this morning with further work-up and plans pending those findings  Michele Kerlin E 03/19/2021, 9:54 AM

## 2021-03-19 NOTE — Discharge Instructions (Signed)
It appears that you have been able to get the food that was stuck in your esophagus, out.  To avoid this from happening again in the short-term, stay on a clear liquid diet for at least 24 hours.  This will give your esophagus a chance to ask.  When you resume eating, only take very small bites, less than the size of a nickel and chew them up very well before attempting to swallow.  Follow-up with the gastroenterologist, as previously planned.

## 2021-03-19 NOTE — Anesthesia Postprocedure Evaluation (Signed)
Anesthesia Post Note  Patient: Alexandra Fletcher  Procedure(s) Performed: ESOPHAGOGASTRODUODENOSCOPY (EGD) FOREIGN BODY REMOVAL BIOPSY     Patient location during evaluation: PACU Anesthesia Type: General Level of consciousness: awake Pain management: pain level controlled Vital Signs Assessment: post-procedure vital signs reviewed and stable Respiratory status: spontaneous breathing Cardiovascular status: stable Postop Assessment: no apparent nausea or vomiting Anesthetic complications: no   No notable events documented.  Last Vitals:  Vitals:   03/19/21 1041 03/19/21 1050  BP: 121/79 132/70  Pulse: 85 90  Resp: 17   Temp: 37.1 C   SpO2: 98% 99%    Last Pain:  Vitals:   03/19/21 1041  TempSrc:   PainSc: 0-No pain                 Huston Foley

## 2021-03-19 NOTE — ED Provider Notes (Addendum)
[facility] DEPT Provider Note   CSN: [id number] Arrival date & time: 03/19/21  1932     History  Chief Complaint  Patient presents with   Food Impaction    Alexandra Fletcher is a 71 y.o. female.  HPI She presents for difficulty swallowing a bite of lasagna.  Earlier today she had an EGD for retained food products which were removed successfully.  She had biopsies done for suspected esophagitis.  She states that this was the first bite of food she had tried since the endoscopy earlier today.  She states that she was "able to cough it up."  She feels better after doing that.  She is concerned that her blood sugars been elevated, after having treatment this morning.  She recalls having glucagon given which is a known cause for rise of glucose.  I explained this to her.    Home Medications Prior to Admission medications   Medication Sig Start Date End Date Taking? Authorizing Provider  HYDROcodone-acetaminophen (NORCO) 5-325 MG tablet Take 1-2 tablets by mouth 3 (three) times daily as needed. To be taken after surgery 03/06/21   Aundra Dubin, PA-C  ondansetron (ZOFRAN) 4 MG tablet Take 1 tablet (4 mg total) by mouth every 8 (eight) hours as needed for nausea or vomiting. 03/06/21   Aundra Dubin, PA-C  amLODipine (NORVASC) 10 MG tablet TAKE 1 TABLET (10 MG TOTAL) BY MOUTH DAILY. 01/12/21 01/12/22  Marin Olp, MD  aspirin EC 81 MG tablet Take 81 mg by mouth daily.    [provider]  atorvastatin (LIPITOR) 20 MG tablet Take 1 tablet (20 mg total) by mouth 4 (four) times a week. 01/24/21   Marin Olp, MD  Blood Glucose Monitoring Suppl (ONE TOUCH ULTRA 2) w/Device KIT USE TO TEST BLOOD SUGAR TWO TO THREE TIMES DAILY 11/19/18   Marin Olp, MD  carvedilol (COREG) 6.25 MG tablet Take 1 tablet (6.25 mg total) by mouth 2 (two) times daily. 01/12/21   Marin Olp, MD  glipiZIDE (GLUCOTROL XL) 10 MG 24 hr tablet TAKE 1  TABLET BY MOUTH EVERY DAY 01/12/21 01/12/22  Marin Olp, MD  glucose blood (ONETOUCH ULTRA) test strip USE TO TEST BLOOD GLUCOSE 2 TO 3 TIMES A DAY 12/12/20 12/09/21  Marin Olp, MD  metFORMIN (GLUCOPHAGE) 1000 MG tablet TAKE 1 TABLET BY MOUTH IN THE MORNING, 1/2 TABLET AT LUNCH AND 1 TABLET AT DINNER. 01/12/21 01/12/22  Marin Olp, MD  ondansetron (ZOFRAN ODT) 4 MG disintegrating tablet Take 1 tablet (4 mg total) by mouth every 8 (eight) hours as needed for nausea or vomiting. 12/31/20   Pearson Forster, NP  OneTouch Delica Lancets 43P MISC USE TO TEST BLOOD SUGAR TWO TO THREE TIMES DAILY 11/19/18   Marin Olp, MD  sitaGLIPtin (JANUVIA) 100 MG tablet Take 1 tablet (100 mg total) by mouth daily. 01/19/21   Marin Olp, MD  vitamin B-12 (CYANOCOBALAMIN) 100 MCG tablet     [provider]      Allergies    Lisinopril, Moxifloxacin, and Pioglitazone    Review of Systems   Review of Systems  All other systems reviewed and are negative.  Physical Exam Updated Vital Signs BP 108/83 (BP Location: Right Arm)    Pulse (!) 105    Temp 98.5 F (36.9 C) (Oral)    Resp 17    LMP  (LMP Unknown)    SpO2 96%  Physical Exam Vitals and nursing note reviewed.  Constitutional:      Appearance: She is well-developed. She is not ill-appearing.  HENT:     Head: Normocephalic and atraumatic.     Right Ear: External ear normal.     Left Ear: External ear normal.     Mouth/Throat:     Comments: She is controlling her oral secretions without spitting, gagging or showing signs of discomfort. Eyes:     Conjunctiva/sclera: Conjunctivae normal.     Pupils: Pupils are equal, round, and reactive to light.  Neck:     Trachea: Phonation normal.  Cardiovascular:     Rate and Rhythm: Normal rate and regular rhythm.     Heart sounds: Normal heart sounds.  Pulmonary:     Effort: Pulmonary effort is normal.     Breath sounds: Normal breath sounds.  Abdominal:     Palpations:  Abdomen is soft.     Tenderness: There is no abdominal tenderness.  Musculoskeletal:        General: Normal range of motion.     Cervical back: Normal range of motion and neck supple.  Skin:    General: Skin is warm and dry.  Neurological:     Mental Status: She is alert and oriented to person, place, and time.     Cranial Nerves: No cranial nerve deficit.     Sensory: No sensory deficit.     Motor: No abnormal muscle tone.     Coordination: Coordination normal.  Psychiatric:        Behavior: Behavior normal.        Thought Content: Thought content normal.        Judgment: Judgment normal.    ED Results / Procedures / Treatments   Labs (all labs ordered are listed, but only abnormal results are displayed) Labs Reviewed - No data to display  EKG None  Radiology No results found.  Procedures Procedures    Medications Ordered in ED Medications - No data to display  ED Course/ Medical Decision Making/ A&P Clinical Course as of 03/19/21 2047  Nancy Fetter Mar 19, 2021  2032 Trial of liquids, failed.  She is now spitting saliva continually. [EW]    Clinical Course User Index [EW] Daleen Bo, MD                           Medical Decision Making   Patient Vitals for the past 24 hrs:  BP Temp Temp src Pulse Resp SpO2  03/19/21 2035 108/83 -- -- (!) 105 17 96 %  03/19/21 2000 (!) 142/73 -- -- 91 17 99 %  03/19/21 1939 136/66 98.5 F (36.9 C) Oral (!) 110 18 99 %    9:33 PM -reevaluation with update and discussion with patient and husband in the room. After initial assessment and treatment, an updated evaluation reveals she is now tolerating liquids, has drank about 8 ounces of water and not vomited or had any trouble controlling her saliva. Illness risk, esophageal obstruction, esophageal perforation, discussed. Daleen Bo    Medical Decision Making: Summary: Patient presenting with concerning symptoms for esophageal obstruction after resuming eating, this evening.  She  was here earlier today with food bolus impaction in the esophagus requiring removal with EGD.  She also had esophageal biopsies at that time.  She had esophagitis visible on EGD.  Critical Interventions-clinical evaluation, trial of swallowing water; to evaluate  Chief Complaint  Patient presents with  Food Impaction    and assess for illness characterized as Acute, Previously Undiagnosed, Uncertain Prognosis, and Complicated   After These Interventions, the Patient was reevaluated and was found to be unable to tolerate drinking water  This patient is Presenting for Evaluation of possible food obstruction in esophagus, which does require a range of treatment options, and is a complaint that involves a moderate risk of morbidity and mortality.  The Differential Diagnoses include esophageal spasm, food bolus in esophagus, complications of esophagitis. I decided to review pertinent External Data, and in summary elderly female, presenting with recurrent symptoms of food impaction, following EGD today for same, about 12 hours ago.  I obtained additional Historical Information from husband at bedside,  to confirm history.      8:33 PM-Consult complete with Dr. Watt Climes, gastroenterologist. Patient case explained and discussed. Requirement for EGD to retrieve food bolus from esophagus agreed on. Possible Risk of decompensation, aspiration or esophageal perforation.  He agrees to evaluate patient in the ED for endoscopy patient for further evaluation and treatment. Call ended at 8:45 PM  9:30 PM-patient has now spontaneously been able to remove her esophageal food obstruction by coughing and vomiting.  After that she was able to swallow water, 8 ounces without vomiting.  I again discussed case with Dr. Watt Climes who recommends discharge and follow-up as previously planned in 1 week.  CRITICAL CARE-no Performed by: Daleen Bo  Nursing Notes Reviewed/ Care Coordinated Applicable Imaging  Reviewed Interpretation of Laboratory Data incorporated into ED treatment  The patient appears reasonably screened and/or stabilized for discharge and I doubt any other medical condition or other [facility] requiring further screening, evaluation, or treatment in the ED at this time prior to discharge.  Plan: Home Medications-continue current prescribed medications; Home Treatments-clear liquid diet for 24 hours and gradually advance; return here if the recommended treatment, does not improve the symptoms; Recommended follow up-GI follow-up 1 week and sooner if needed.  PCP as needed.             Final Clinical Impression(s) / ED Diagnoses Final diagnoses:  Esophageal obstruction    Rx / DC Orders ED Discharge Orders     None         Daleen Bo, MD 03/19/21 2047    Daleen Bo, MD 03/19/21 2136

## 2021-03-21 ENCOUNTER — Encounter (HOSPITAL_COMMUNITY): Payer: Self-pay | Admitting: Gastroenterology

## 2021-03-22 ENCOUNTER — Other Ambulatory Visit: Payer: Self-pay

## 2021-03-22 ENCOUNTER — Encounter: Payer: Self-pay | Admitting: Orthopaedic Surgery

## 2021-03-22 ENCOUNTER — Ambulatory Visit (INDEPENDENT_AMBULATORY_CARE_PROVIDER_SITE_OTHER): Payer: Medicare Other | Admitting: Orthopaedic Surgery

## 2021-03-22 DIAGNOSIS — M1711 Unilateral primary osteoarthritis, right knee: Secondary | ICD-10-CM

## 2021-03-22 DIAGNOSIS — S83241A Other tear of medial meniscus, current injury, right knee, initial encounter: Secondary | ICD-10-CM

## 2021-03-22 LAB — SURGICAL PATHOLOGY

## 2021-03-22 NOTE — Progress Notes (Signed)
Post-Op Visit Note   Patient: Alexandra Fletcher           Date of Birth: 20-Jun-1950           MRN: [id number] Visit Date: 03/22/2021 PCP: Marin Olp, MD   Assessment & Plan:  Chief Complaint:  Chief Complaint  Patient presents with   Right Knee - Pain, Follow-up, Routine Post Op   Visit Diagnoses:  1. Acute medial meniscus tear of right knee, initial encounter   2. Primary osteoarthritis of right knee     Plan: [name] is 2-week status post right knee arthroscopy partial medial meniscectomy and chondroplasty.  She states that she is doing great with some pain at times.  She states that the catching pain that she used to get with knee extension is completely gone.  Overall very happy.  She does have some residual medial sided knee pain.  She is walking great today.  Her range of motion is excellent.  Surgical incisions are healed.  No signs of infection.  No joint effusion.  I reviewed the arthroscopic photos with her today which does show grade 4 changes of the medial and patellofemoral compartments.  Overall the pain that she gets from that seems to be well-tolerated and I am glad that the presurgical pain is gone.  We provided exercises today she can increase activity as tolerated.  We will see her back in about a month.  Follow-Up Instructions: Return in about 4 weeks (around 04/19/2021).   Orders:  No orders of the defined types were placed in this encounter.  No orders of the defined types were placed in this encounter.   Imaging: No results found.  PMFS History: Patient Active Problem List   Diagnosis Date Noted   Acute medial meniscus tear, right, initial encounter 03/09/2021   Type 2 diabetes mellitus with peripheral vascular disease ([place]) 07/07/2019   Posterior tibial tendinitis of right lower extremity 04/01/2018   Dysphagia, pharyngoesophageal phase 02/21/2015   Diabetic retinopathy ([place]) 12/19/2014   History of CVA - Acute right arterial  ischemic stroke, PCA (posterior cerebral artery) 01/04/2013   Eosinophilia 12/03/2011   Sinus tarsi syndrome 01/31/2011   Baker's cyst of knee 10/10/2010   Hyperlipidemia associated with type 2 diabetes mellitus ([place]) 08/16/2009   Hypertension associated with diabetes ([place]) 08/16/2009   History of colonic polyps 08/16/2009   HEART MURMUR, BENIGN 05/16/2007   History of abnormal cervical Pap smear 04/21/2007   Type 2 diabetes mellitus with ophthalmic complication ([place]) 35/32/9924   Past Medical History:  Diagnosis Date   Allergy    Arthritis    Cataract    mild x both eyes    Cerebral thrombosis with cerebral infarction [facility]) 2002   diagnosis needs confirmed by review of [place] records   Diabetes mellitus    Heart murmur    1x per Dr. Linna Darner- not recurrent   Hyperlipidemia    Hypertension    Partial rupture of Achilles tendon    After fluorquinolone use    Family History  Problem Relation Age of Onset   Liver disease Mother        ? malignancy after Hepatitis   Diabetes Father    Heart attack Father        CBAG @ 29   Multiple myeloma Father    Hyperlipidemia Father    Hypertension Father    Diabetes Brother        Type 1 DM   Heart attack Brother  58   Stroke Maternal Aunt        > 81   Breast cancer Cousin    Other Sister        died as child from pneumonia   Healthy Brother    Colon cancer Neg Hx    Colon polyps Neg Hx    Esophageal cancer Neg Hx    Rectal cancer Neg Hx    Stomach cancer Neg Hx     Past Surgical History:  Procedure Laterality Date   BIOPSY  03/19/2021   Procedure: BIOPSY;  Surgeon: Clarene Essex, MD;  Location: [facility] ENDOSCOPY;  Service: Endoscopy;;   CATARACT EXTRACTION, BILATERAL     COLONOSCOPY  2008   neg   DILATION AND CURETTAGE OF UTERUS     X2   ESOPHAGOGASTRODUODENOSCOPY N/A 03/19/2021   Procedure: ESOPHAGOGASTRODUODENOSCOPY (EGD);  Surgeon: Clarene Essex, MD;  Location: [facility];  Service: Endoscopy;  Laterality: N/A;   FOREIGN  BODY REMOVAL  03/19/2021   Procedure: FOREIGN BODY REMOVAL;  Surgeon: Clarene Essex, MD;  Location: [facility] ENDOSCOPY;  Service: Endoscopy;;   KNEE ARTHROSCOPY W/ MENISCAL REPAIR  2008    Dr Adella Nissen knee   Ovarian cysts removed      Dr Warnell Forester   POLYPECTOMY  2003   polypoid colonic mucosa/ 2008 no polyps   THUMB SURGERY  1983   Left; post skiing accident in [place]   Occupational History   Occupation: Agricultural engineer  Tobacco Use   Smoking status: Never   Smokeless tobacco: Never  Substance and Sexual Activity   Alcohol use: Yes    Alcohol/week: 7.0 standard drinks    Types: 7 Glasses of wine per week   Drug use: No   Sexual activity: Yes    Birth control/protection: Post-menopausal

## 2021-04-11 ENCOUNTER — Other Ambulatory Visit: Payer: Self-pay | Admitting: Family Medicine

## 2021-04-19 ENCOUNTER — Other Ambulatory Visit: Payer: Self-pay

## 2021-04-19 ENCOUNTER — Ambulatory Visit (INDEPENDENT_AMBULATORY_CARE_PROVIDER_SITE_OTHER): Payer: Medicare Other | Admitting: Orthopaedic Surgery

## 2021-04-19 ENCOUNTER — Encounter: Payer: Self-pay | Admitting: Orthopaedic Surgery

## 2021-04-19 DIAGNOSIS — S83241A Other tear of medial meniscus, current injury, right knee, initial encounter: Secondary | ICD-10-CM

## 2021-04-19 DIAGNOSIS — M1711 Unilateral primary osteoarthritis, right knee: Secondary | ICD-10-CM

## 2021-04-19 NOTE — Progress Notes (Signed)
Post-Op Visit Note   Patient: Alexandra Fletcher           Date of Birth: 1951/01/22           MRN: [id number] Visit Date: 04/19/2021 PCP: Marin Olp, MD   Assessment & Plan:  Chief Complaint:  Chief Complaint  Patient presents with   Right Knee - Pain   Visit Diagnoses:  1. Acute medial meniscus tear of right knee, initial encounter   2. Primary osteoarthritis of right knee     Plan: Alexandra Fletcher is 6 weeks status post partial medial meniscectomy.  She has some pain at times.  She states that she cannot walk without pain.  She definitely has some pain especially on uneven terrain.  She feels some discomfort in the front of the knee from the surgery but the pinching pain from the meniscus tear is definitely gone.  She has some throbbing pain.  Currently doing home exercises.  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  Examination of the right knee shows fully healed surgical scars.  She has excellent range of motion.  No joint line tenderness.  No signs of infection.  No joint effusion.  From my standpoint Alexandra Fletcher has recovered from the surgery.  She does not have any restrictions in activity or what she wants to do other than what she is limited to by her pain.  She understands that she does have advanced chondromalacia in the knee and she will have to decide if this pain is manageable or not.  We did discuss that the neck step would be a knee replacement if she is not able to live with this pain.  However I would give it another few months to see how she does from the surgery before deciding if she would want to move forward with a knee replacement.  Follow-Up Instructions: No follow-ups on file.   Orders:  No orders of the defined types were placed in this encounter.  No orders of the defined types were placed in this encounter.   Imaging: No results found.  PMFS History: Patient Active Problem List   Diagnosis Date Noted    Acute medial meniscus tear, right, initial encounter 03/09/2021   Type 2 diabetes mellitus with peripheral vascular disease ([place]) 07/07/2019   Posterior tibial tendinitis of right lower extremity 04/01/2018   Dysphagia, pharyngoesophageal phase 02/21/2015   Diabetic retinopathy ([place]) 12/19/2014   History of CVA - Acute right arterial ischemic stroke, PCA (posterior cerebral artery) 01/04/2013   Eosinophilia 12/03/2011   Sinus tarsi syndrome 01/31/2011   Baker's cyst of knee 10/10/2010   Hyperlipidemia associated with type 2 diabetes mellitus ([place]) 08/16/2009   Hypertension associated with diabetes ([place]) 08/16/2009   History of colonic polyps 08/16/2009   HEART MURMUR, BENIGN 05/16/2007   History of abnormal cervical Pap smear 04/21/2007   Type 2 diabetes mellitus with ophthalmic complication ([place]) 92/01/9416   Past Medical History:  Diagnosis Date   Allergy    Arthritis    Cataract    mild x both eyes    Cerebral thrombosis with cerebral infarction [facility]) 2002   diagnosis needs confirmed by review of [place] records   Diabetes mellitus    Heart murmur    1x per Dr. Linna Darner- not recurrent   Hyperlipidemia    Hypertension    Partial rupture of Achilles tendon    After fluorquinolone use    Family History  Problem Relation Age of Onset   Liver disease Mother        ? malignancy after Hepatitis   Diabetes Father    Heart attack Father        CBAG @ 30   Multiple myeloma Father    Hyperlipidemia Father    Hypertension Father    Diabetes Brother        Type 1 DM   Heart attack Brother 40   Stroke Maternal Aunt        > 51   Breast cancer Cousin    Other Sister        died as child from pneumonia   Healthy Brother    Colon cancer Neg Hx    Colon polyps Neg Hx    Esophageal cancer Neg Hx    Rectal cancer Neg Hx    Stomach cancer Neg Hx     Past Surgical History:  Procedure Laterality Date   BIOPSY  03/19/2021   Procedure: BIOPSY;  Surgeon: Clarene Essex, MD;   Location: [facility];  Service: Endoscopy;;   CATARACT EXTRACTION, BILATERAL     COLONOSCOPY  2008   neg   DILATION AND CURETTAGE OF UTERUS     X2   ESOPHAGOGASTRODUODENOSCOPY N/A 03/19/2021   Procedure: ESOPHAGOGASTRODUODENOSCOPY (EGD);  Surgeon: Clarene Essex, MD;  Location: [facility];  Service: Endoscopy;  Laterality: N/A;   FOREIGN BODY REMOVAL  03/19/2021   Procedure: FOREIGN BODY REMOVAL;  Surgeon: Clarene Essex, MD;  Location: [facility];  Service: Endoscopy;;   KNEE ARTHROSCOPY W/ MENISCAL REPAIR  2008    Dr Adella Nissen knee   Ovarian cysts removed      Dr Warnell Forester   POLYPECTOMY  2003   polypoid colonic mucosa/ 2008 no polyps   THUMB SURGERY  1983   Left; post skiing accident in [place]   Occupational History   Occupation: Agricultural engineer  Tobacco Use   Smoking status: Never   Smokeless tobacco: Never  Substance and Sexual Activity   Alcohol use: Yes    Alcohol/week: 7.0 standard drinks    Types: 7 Glasses of wine per week   Drug use: No   Sexual activity: Yes    Birth control/protection: Post-menopausal

## 2021-05-18 DIAGNOSIS — R1310 Dysphagia, unspecified: Secondary | ICD-10-CM | POA: Diagnosis not present

## 2021-06-12 ENCOUNTER — Other Ambulatory Visit: Payer: Self-pay | Admitting: Family Medicine

## 2021-07-11 ENCOUNTER — Other Ambulatory Visit: Payer: Self-pay | Admitting: Family Medicine

## 2021-07-13 ENCOUNTER — Other Ambulatory Visit: Payer: Self-pay

## 2021-07-25 ENCOUNTER — Ambulatory Visit (INDEPENDENT_AMBULATORY_CARE_PROVIDER_SITE_OTHER): Payer: Medicare Other | Admitting: Family Medicine

## 2021-07-25 ENCOUNTER — Encounter: Payer: Self-pay | Admitting: Family Medicine

## 2021-07-25 VITALS — BP 116/63 | HR 85 | Temp 97.9°F | Ht 63.0 in | Wt 171.2 lb

## 2021-07-25 DIAGNOSIS — E113299 Type 2 diabetes mellitus with mild nonproliferative diabetic retinopathy without macular edema, unspecified eye: Secondary | ICD-10-CM

## 2021-07-25 DIAGNOSIS — I10 Essential (primary) hypertension: Secondary | ICD-10-CM | POA: Diagnosis not present

## 2021-07-25 DIAGNOSIS — Z79899 Other long term (current) drug therapy: Secondary | ICD-10-CM

## 2021-07-25 DIAGNOSIS — E1169 Type 2 diabetes mellitus with other specified complication: Secondary | ICD-10-CM | POA: Diagnosis not present

## 2021-07-25 DIAGNOSIS — E785 Hyperlipidemia, unspecified: Secondary | ICD-10-CM

## 2021-07-25 DIAGNOSIS — E113293 Type 2 diabetes mellitus with mild nonproliferative diabetic retinopathy without macular edema, bilateral: Secondary | ICD-10-CM | POA: Diagnosis not present

## 2021-07-25 DIAGNOSIS — Z0000 Encounter for general adult medical examination without abnormal findings: Secondary | ICD-10-CM

## 2021-07-25 LAB — CBC WITH DIFFERENTIAL/PLATELET
Basophils Absolute: 0.1 10*3/uL (ref 0.0–0.1)
Basophils Relative: 0.9 % (ref 0.0–3.0)
Eosinophils Absolute: 0.4 10*3/uL (ref 0.0–0.7)
Eosinophils Relative: 5.2 % — ABNORMAL HIGH (ref 0.0–5.0)
HCT: 39.8 % (ref 36.0–46.0)
Hemoglobin: 13.5 g/dL (ref 12.0–15.0)
Lymphocytes Relative: 20.7 % (ref 12.0–46.0)
Lymphs Abs: 1.6 10*3/uL (ref 0.7–4.0)
MCHC: 34 g/dL (ref 30.0–36.0)
MCV: 89.8 fl (ref 78.0–100.0)
Monocytes Absolute: 0.4 10*3/uL (ref 0.1–1.0)
Monocytes Relative: 5.5 % (ref 3.0–12.0)
Neutro Abs: 5.1 10*3/uL (ref 1.4–7.7)
Neutrophils Relative %: 67.7 % (ref 43.0–77.0)
Platelets: 263 10*3/uL (ref 150.0–400.0)
RBC: 4.43 Mil/uL (ref 3.87–5.11)
RDW: 13.3 % (ref 11.5–15.5)
WBC: 7.5 10*3/uL (ref 4.0–10.5)

## 2021-07-25 LAB — LIPID PANEL
Cholesterol: 160 mg/dL (ref 0–200)
HDL: 67.7 mg/dL (ref >39.00)
LDL Cholesterol: 81 mg/dL (ref 0–99)
NonHDL: 92.09
Total CHOL/HDL Ratio: 2
Triglycerides: 56 mg/dL (ref 0.0–149.0)
VLDL: 11.2 mg/dL (ref 0.0–40.0)

## 2021-07-25 LAB — COMPREHENSIVE METABOLIC PANEL
ALT: 19 U/L (ref 0–35)
AST: 15 U/L (ref 0–37)
Albumin: 4.6 g/dL (ref 3.5–5.2)
Alkaline Phosphatase: 66 U/L (ref 39–117)
BUN: 24 mg/dL — ABNORMAL HIGH (ref 6–23)
CO2: 24 mEq/L (ref 19–32)
Calcium: 10 mg/dL (ref 8.4–10.5)
Chloride: 99 mEq/L (ref 96–112)
Creatinine, Ser: 0.83 mg/dL (ref 0.40–1.20)
GFR: 70.85 mL/min (ref >60.00)
Glucose, Bld: 156 mg/dL — ABNORMAL HIGH (ref 70–99)
Potassium: 3.9 mEq/L (ref 3.5–5.1)
Sodium: 135 mEq/L (ref 135–145)
Total Bilirubin: 0.6 mg/dL (ref 0.2–1.2)
Total Protein: 7.7 g/dL (ref 6.0–8.3)

## 2021-07-25 LAB — MICROALBUMIN / CREATININE URINE RATIO
Creatinine,U: 86.3 mg/dL
Microalb Creat Ratio: 2.4 mg/g (ref 0.0–30.0)
Microalb, Ur: 2.1 mg/dL — ABNORMAL HIGH (ref 0.0–1.9)

## 2021-07-25 LAB — VITAMIN B12: Vitamin B-12: 1121 pg/mL — ABNORMAL HIGH (ref 211–911)

## 2021-07-25 LAB — HEMOGLOBIN A1C: Hgb A1c MFr Bld: 6.8 % — ABNORMAL HIGH (ref 4.6–6.5)

## 2021-07-25 NOTE — Patient Instructions (Addendum)
Please stop by lab before you go ?If you have mychart- we will send your results within 3 business days of [place] receiving them.  ?If you do not have mychart- we will call you about results within 5 business days of [place] receiving them.  ?*please also note that you will see labs on mychart as soon as they post. I will later go in and write notes on them- will say "notes from Dr. Yong Channel"  ? ?Blood pressure is very well controlled at home and reasonable on repeat here.  I think she has space to reduce amlodipine-can try half tablet for the next 2 to 3 weeks and update me with blood pressures.  We can send in a smaller dose if she would like at that time. ? ?Recommended follow up: Return in about 6 months (around 01/25/2022) for followup or sooner if needed.Schedule b4 you leave. ?

## 2021-07-25 NOTE — Progress Notes (Signed)
?Phone [phone] ?  ?Subjective:  ?Patient presents today for their annual physical. Chief complaint-noted.  ? ?See problem oriented charting- ?ROS- full  review of systems was completed and negative ?except for: leg swelling from amlodipine ? ?The following were reviewed and entered/updated in epic: ?Past Medical History:  ?Diagnosis Date  ? Allergy   ? Arthritis   ? Cataract   ? mild x both eyes   ? Cerebral thrombosis with cerebral infarction [facility]) 2002  ? diagnosis needs confirmed by review of [facility]  ? Diabetes mellitus   ? Heart murmur   ? 1x per Dr. Linna Darner- not recurrent  ? Hyperlipidemia   ? Hypertension   ? Partial rupture of Achilles tendon   ? After fluorquinolone use  ? ?Patient Active Problem List  ? Diagnosis Date Noted  ? History of CVA - Acute right arterial ischemic stroke, PCA (posterior cerebral artery) 01/04/2013  ?  Priority: High  ? Type 2 diabetes mellitus with ophthalmic complication ([place]) 86/76/7209  ?  Priority: High  ? Diabetic retinopathy ([place]) 12/19/2014  ?  Priority: Medium   ? Hyperlipidemia associated with type 2 diabetes mellitus ([place]) 08/16/2009  ?  Priority: Medium   ? Essential hypertension 08/16/2009  ?  Priority: Medium   ? Dysphagia, pharyngoesophageal phase 02/21/2015  ?  Priority: Low  ? Eosinophilia 12/03/2011  ?  Priority: Low  ? Sinus tarsi syndrome 01/31/2011  ?  Priority: Low  ? Baker's cyst of knee 10/10/2010  ?  Priority: Low  ? History of colonic polyps 08/16/2009  ?  Priority: Low  ? HEART MURMUR, BENIGN 05/16/2007  ?  Priority: Low  ? History of abnormal cervical Pap smear 04/21/2007  ?  Priority: Low  ? ?Past Surgical History:  ?Procedure Laterality Date  ? BIOPSY  03/19/2021  ? Procedure: BIOPSY;  Surgeon: Clarene Essex, MD;  Location: [facility];  Service: Endoscopy;;  ? CATARACT EXTRACTION, BILATERAL    ? COLONOSCOPY  2008  ? neg  ? DILATION AND CURETTAGE OF UTERUS    ? X2  ? ESOPHAGOGASTRODUODENOSCOPY N/A 03/19/2021  ? Procedure:  ESOPHAGOGASTRODUODENOSCOPY (EGD);  Surgeon: Clarene Essex, MD;  Location: [facility];  Service: Endoscopy;  Laterality: N/A;  ? FOREIGN BODY REMOVAL  03/19/2021  ? Procedure: FOREIGN BODY REMOVAL;  Surgeon: Clarene Essex, MD;  Location: [facility];  Service: Endoscopy;;  ? KNEE ARTHROSCOPY W/ MENISCAL REPAIR  2008  ?  Dr Adella Nissen knee  ? Ovarian cysts removed    ?  Dr Warnell Forester  ? POLYPECTOMY  2003  ? polypoid colonic mucosa/ 2008 no polyps  ? [place]  ? Left; post skiing accident in [place]  ? ? ?Family History  ?Problem Relation Age of Onset  ? Liver disease Mother   ?     ? malignancy after Hepatitis  ? Diabetes Father   ? Heart attack Father   ?     CBAG @ 51  ? Multiple myeloma Father   ? Hyperlipidemia Father   ? Hypertension Father   ? Diabetes Brother   ?     Type 1 DM  ? Heart attack Brother 52  ? Stroke Maternal Aunt   ?     > 65  ? Breast cancer Cousin   ? Other Sister   ?     died as child from pneumonia  ? Healthy Brother   ? Colon cancer Neg Hx   ? Colon polyps Neg Hx   ?  Esophageal cancer Neg Hx   ? Rectal cancer Neg Hx   ? Stomach cancer Neg Hx   ? ? ?Medications- reviewed and updated ?Current Outpatient Medications  ?Medication Sig Dispense Refill  ? amLODipine (NORVASC) 10 MG tablet TAKE 1 TABLET (10 MG TOTAL) BY MOUTH DAILY. 90 tablet 3  ? aspirin EC 81 MG tablet Take 81 mg by mouth daily.    ? atorvastatin (LIPITOR) 20 MG tablet Take 1 tablet (20 mg total) by mouth 4 (four) times a week. 80 tablet 3  ? Blood Glucose Monitoring Suppl (ONE TOUCH ULTRA 2) w/Device KIT USE TO TEST BLOOD SUGAR TWO TO THREE TIMES DAILY 1 kit 0  ? carvedilol (COREG) 6.25 MG tablet TAKE ONE TABLET BY MOUTH TWICE A DAY 180 tablet 0  ? glipiZIDE (GLUCOTROL XL) 10 MG 24 hr tablet TAKE 1 TABLET BY MOUTH EVERY DAY 90 tablet 2  ? glucose blood (ONETOUCH ULTRA) test strip USE TO TEST BLOOD GLUCOSE 2 TO 3 TIMES PER DAY 100 strip 2  ? JANUVIA 100 MG tablet TAKE ONE TABLET BY MOUTH DAILY 90 tablet 1  ? metFORMIN  (GLUCOPHAGE) 1000 MG tablet TAKE 1 TABLET BY MOUTH IN THE MORNING, 1/2 TABLET AT LUNCH AND 1 TABLET AT DINNER. 225 tablet 3  ? ondansetron (ZOFRAN) 4 MG tablet Take 1 tablet (4 mg total) by mouth every 8 (eight) hours as needed for nausea or vomiting. 40 tablet 0  ? OneTouch Delica Lancets 93T MISC USE TO TEST BLOOD SUGAR TWO TO THREE TIMES DAILY 100 each 1  ? vitamin B-12 (CYANOCOBALAMIN) 100 MCG tablet     ? ondansetron (ZOFRAN ODT) 4 MG disintegrating tablet Take 1 tablet (4 mg total) by mouth every 8 (eight) hours as needed for nausea or vomiting. 20 tablet 0  ? pantoprazole (PROTONIX) 40 MG tablet Take 1 tablet by mouth daily.    ? ?No current facility-administered medications for this visit.  ? ? ?Allergies-reviewed and updated ?Allergies  ?Allergen Reactions  ? Lisinopril   ?  Swelling of tongue  ? Moxifloxacin   ?  Achilles tendon rupture potentially  ? Pioglitazone   ?  REACTION: weight gain & ankle edema  ? ? ?Social History  ? ?Social History Narrative  ? Married 1976. 2 grown sons- 3 grandkids ( 2 close- granddaughters, and 1 grandson lives further away)  ?   ? Bachelor's degree  ? 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   ? Hobbies: enjoys travel, plays bridge  ? ?Objective  ?Objective:  ?BP 116/63 Comment: average of last 18 home readings  Pulse 85   Temp 97.9 ?F (36.6 ?C)   Ht '5\' 3"'  (1.6 m)   Wt 171 lb 3.2 oz (77.7 kg)   LMP  (LMP Unknown)   SpO2 97%   BMI 30.33 kg/m?  ?Gen: NAD, resting comfortably ?HEENT: Mucous membranes are moist. Oropharynx normal ?Neck: no thyromegaly ?CV: RRR no murmurs rubs or gallops ?Lungs: CTAB no crackles, wheeze, rhonchi ?Abdomen: soft/nontender/nondistended/normal bowel sounds. No rebound or guarding.  ?Ext: no edema ?Skin: warm, dry ?Neuro: grossly normal, moves all extremities, PERRLA ?  ?Assessment and Plan  ? ?71 y.o. female presenting for annual physical.  ?Health Maintenance counseling: ?1. Anticipatory guidance: Patient counseled regarding  regular dental exams -q6 months, eye exams -scheduled in july,  avoiding smoking and second hand smoke , limiting alcohol to 1 beverage per day , no illicit drugs .   ?2. Risk factor reduction:  Advised patient of need for regular exercise and diet rich and fruits and vegetables to reduce risk of heart attack and stroke.  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  ?Diet/weight management-weight within 2 pounds of last visit- eats reasonably healthy- with her muscle mass not overly concerned about BMI ?Wt Readings from Last 3 Encounters:  ?07/25/21 171 lb 3.2 oz (77.7 kg)  ?03/19/21 167 lb (75.8 kg)  ?01/31/21 170 lb (77.1 kg)  ?3. Immunizations/screenings/ancillary studies-fully up-to-date but wants to hold off on bivalent booster. Had covid in October.   ?Immunization History  ?Administered Date(s) Administered  ? Fluad Quad(high Dose 65+) 12/15/2019, 12/26/2020  ? Influenza Split 02/22/2011, 12/03/2011  ? Influenza Whole 01/15/2007, 12/17/2007, 03/03/2008, 12/07/2009  ? Influenza, High Dose Seasonal PF 01/13/2016, 12/27/2016, 12/10/2017  ? Influenza,inj,Quad PF,6+ Mos 01/20/2013  ? Influenza-Unspecified 12/30/2014, 11/06/2018  ? PFIZER(Purple Top)SARS-COV-2 Vaccination 04/04/2019, 04/22/2019, 12/15/2019  ? Pneumococcal Conjugate-13 02/21/2015  ? Pneumococcal Polysaccharide-23 01/24/2017  ? Td 05/21/2002  ? Tdap 02/11/2013  ? Zoster Recombinat (Shingrix) 02/12/2017, 06/03/2017  ? Zoster, Live 01/05/2011  ? 4. Cervical cancer screening-  Past age based screening recommendations- she wants to hold off.  ?5. Breast cancer screening-  breast exam-prefers self exams- and mammogram 12/30/20 ?6. Colon cancer screening - 07/25/2017 with 5-year follow-up planned with Dr. Watt Climes ?7. Skin cancer screening-follows with Dr. Wilhemina Bonito.Marland Kitchen advised regular sunscreen use. Denies worrisome, changing, or new skin lesions.  ?8. Birth control/STD check-  monogamous/postmenopausal ?9. Osteoporosis screening at 9- normal at age 31-69 could repeat in 5-10 years if desired ?10. Smoking associated screening -never smoker ? ?Status of chronic or acute concerns  ? ?#Medial meniscus tear of the right knee arthroscopic su

## 2021-07-29 ENCOUNTER — Other Ambulatory Visit: Payer: Self-pay | Admitting: Family Medicine

## 2021-08-02 ENCOUNTER — Other Ambulatory Visit: Payer: Self-pay

## 2021-08-02 MED ORDER — ATORVASTATIN CALCIUM 20 MG PO TABS: ORAL_TABLET | ORAL | 3 refills | Status: DC

## 2021-09-14 DIAGNOSIS — Z7984 Long term (current) use of oral hypoglycemic drugs: Secondary | ICD-10-CM | POA: Diagnosis not present

## 2021-09-14 DIAGNOSIS — E113293 Type 2 diabetes mellitus with mild nonproliferative diabetic retinopathy without macular edema, bilateral: Secondary | ICD-10-CM | POA: Diagnosis not present

## 2021-09-14 DIAGNOSIS — H35412 Lattice degeneration of retina, left eye: Secondary | ICD-10-CM | POA: Diagnosis not present

## 2021-09-14 DIAGNOSIS — H35371 Puckering of macula, right eye: Secondary | ICD-10-CM | POA: Diagnosis not present

## 2021-09-14 LAB — HM DIABETES EYE EXAM

## 2021-09-20 ENCOUNTER — Other Ambulatory Visit: Payer: Self-pay | Admitting: Family Medicine

## 2021-10-05 ENCOUNTER — Other Ambulatory Visit: Payer: Self-pay | Admitting: Family Medicine

## 2021-10-27 ENCOUNTER — Telehealth: Payer: Self-pay | Admitting: Family Medicine

## 2021-10-27 NOTE — Telephone Encounter (Signed)
Copied from [place] [phone]. Topic: Medicare AWV >> Oct 27, 2021 10:28 AM Devoria Glassing wrote: Reason for CRM: Left message for patient to reschedule Annual Wellness Visit.  Please schedule with Nurse Health Advisor Charlott Rakes, RN at [facility]. This appt can be telephone or office visit. Please call [phone] ask for [facility]

## 2021-11-02 ENCOUNTER — Ambulatory Visit: Payer: Medicare Other

## 2021-11-13 DIAGNOSIS — Z85828 Personal history of other malignant neoplasm of skin: Secondary | ICD-10-CM | POA: Diagnosis not present

## 2021-11-13 DIAGNOSIS — L570 Actinic keratosis: Secondary | ICD-10-CM | POA: Diagnosis not present

## 2021-11-13 DIAGNOSIS — L821 Other seborrheic keratosis: Secondary | ICD-10-CM | POA: Diagnosis not present

## 2021-11-15 ENCOUNTER — Encounter: Payer: Self-pay | Admitting: Family Medicine

## 2021-11-22 DIAGNOSIS — R1310 Dysphagia, unspecified: Secondary | ICD-10-CM | POA: Diagnosis not present

## 2021-11-22 DIAGNOSIS — K269 Duodenal ulcer, unspecified as acute or chronic, without hemorrhage or perforation: Secondary | ICD-10-CM | POA: Diagnosis not present

## 2021-12-07 IMAGING — DX DG KNEE AP/LAT W/ SUNRISE*R*
3 series · 3 of 3 positions shown · non-contrast
Comparison: No recent.

CLINICAL DATA: Right knee pain.

EXAM:
RIGHT KNEE 3 VIEWS

[knee ap]
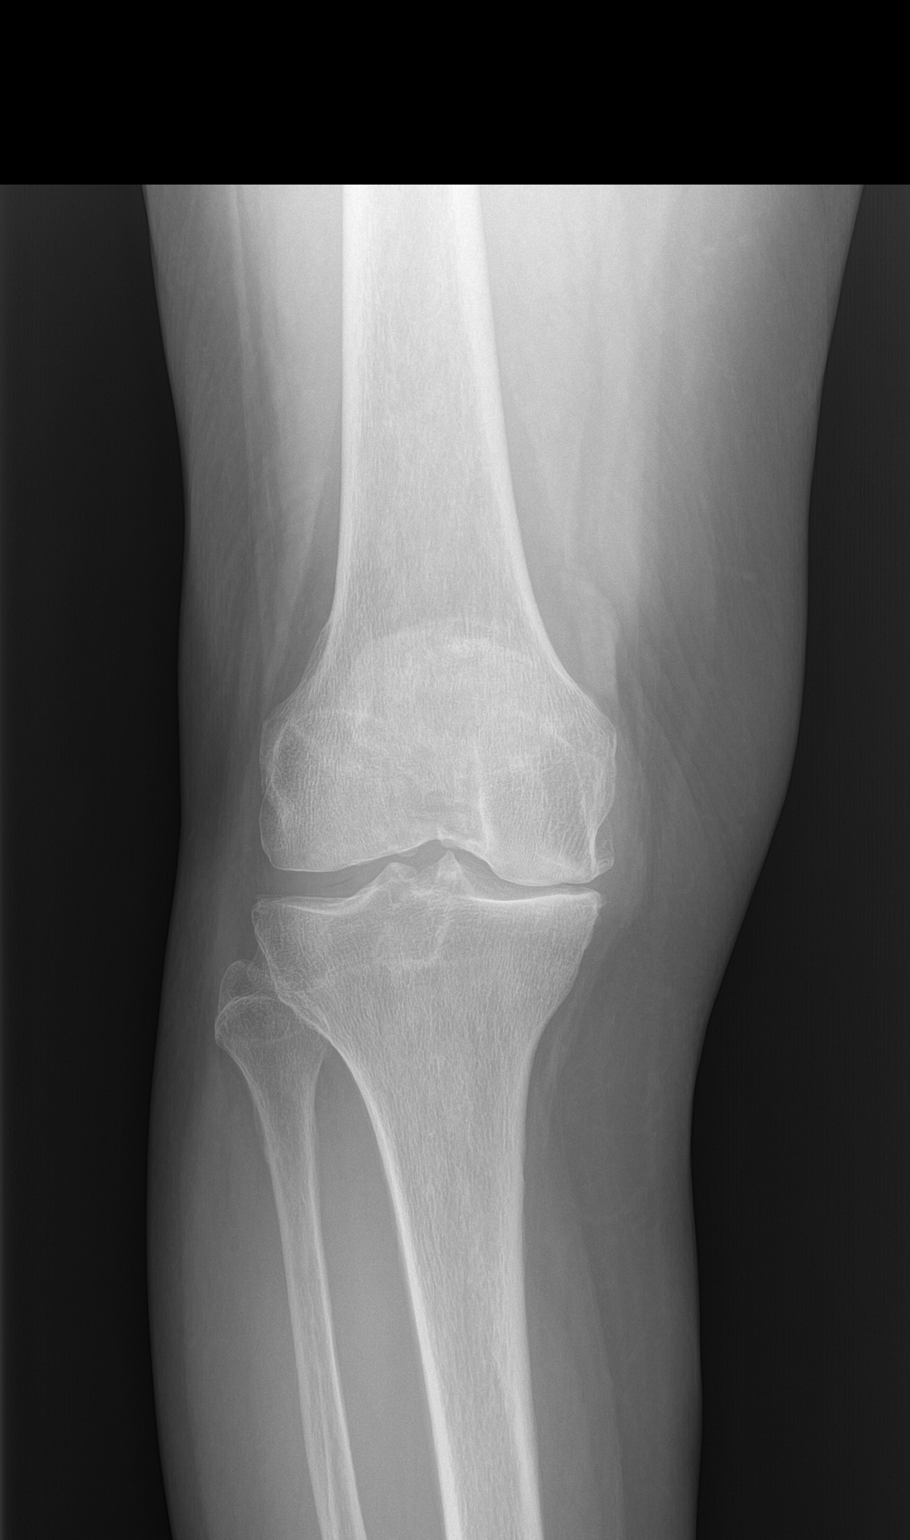

[knee lat]
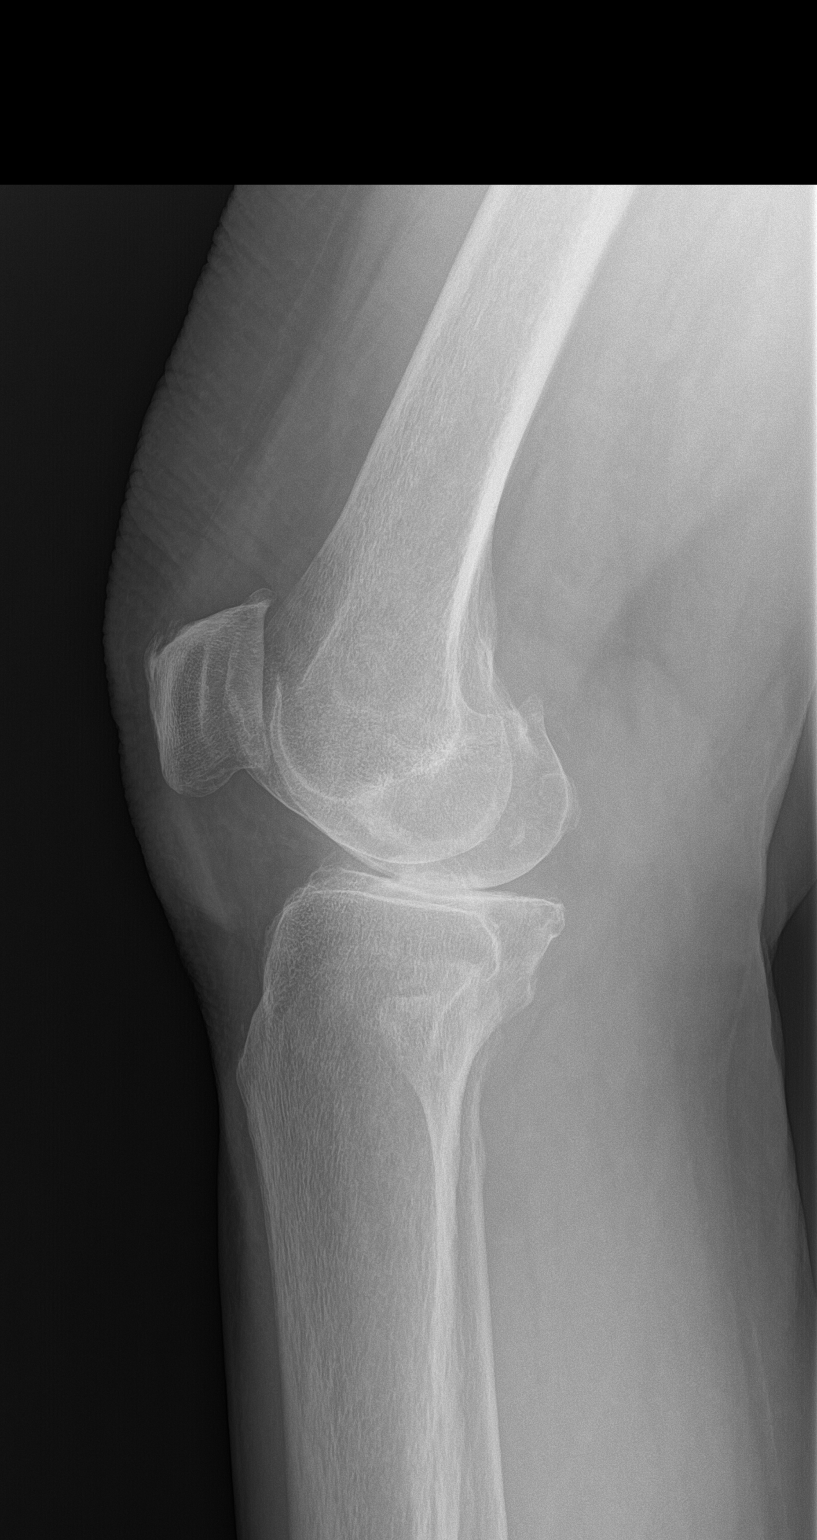

[patella]
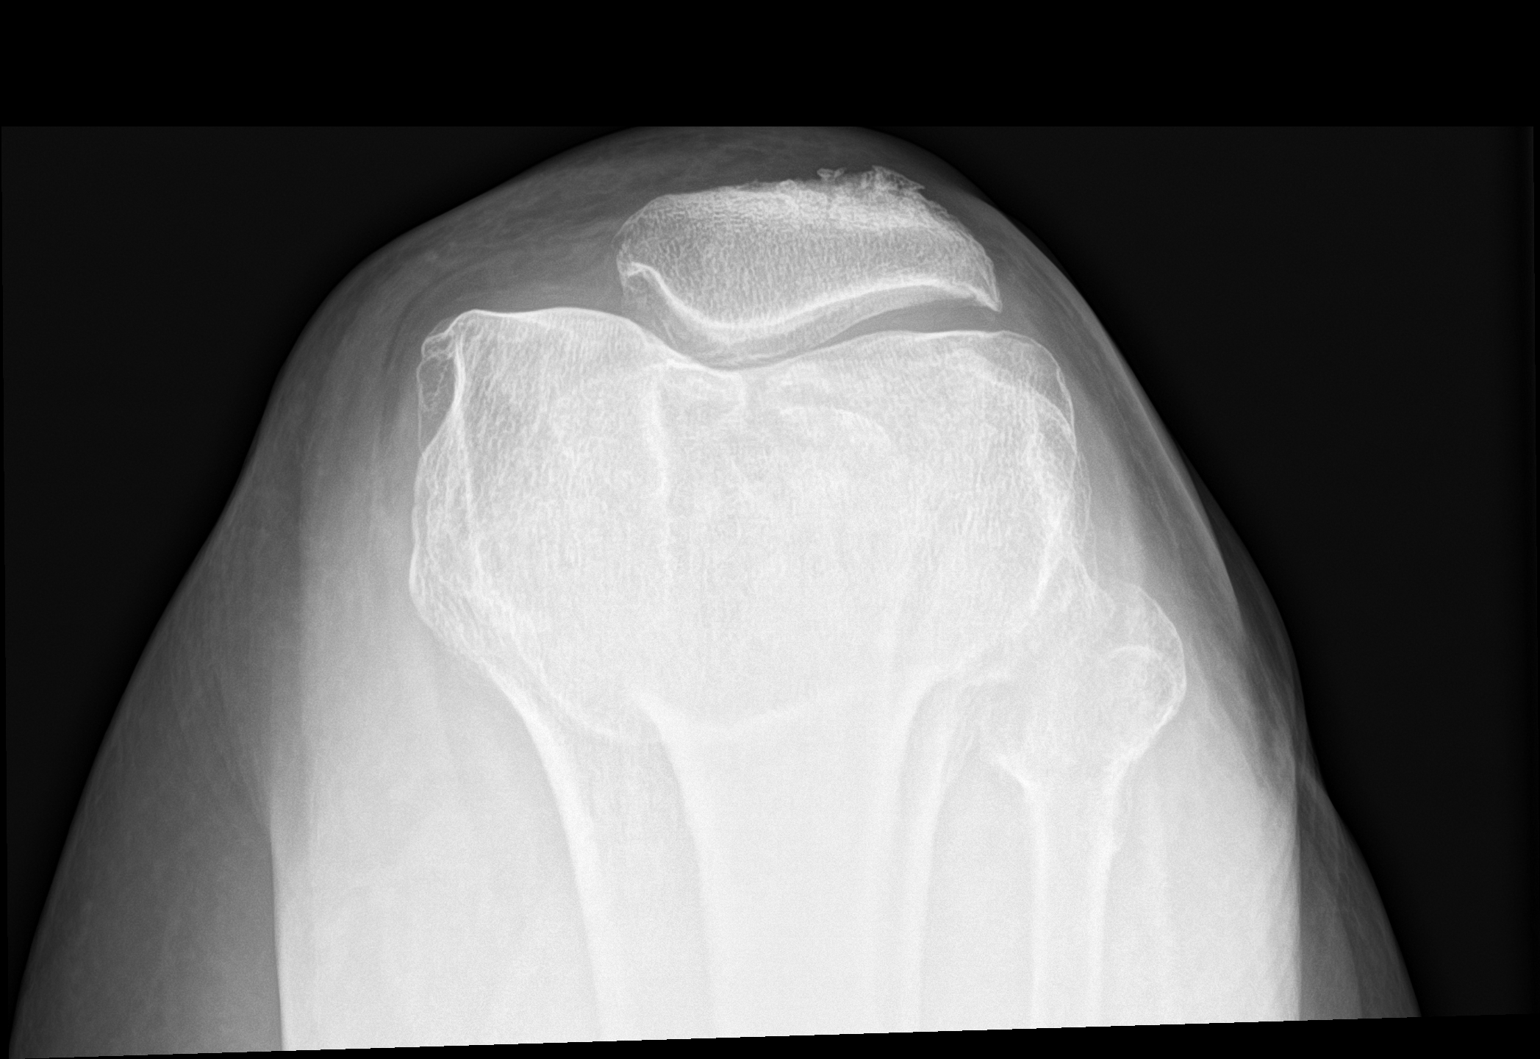

[3 of 3 positions shown; findings below may reference images not displayed]

FINDINGS: Tricompartment degenerative change. Degenerative changes most
prominent about the patellofemoral and medial compartments. No acute
bony or joint abnormality. No evidence of effusion.
IMPRESSION: Tricompartment degenerative changes. No acute abnormality
identified. No evidence of fracture or dislocation.

## 2021-12-11 ENCOUNTER — Encounter: Payer: Self-pay | Admitting: *Deleted

## 2021-12-19 ENCOUNTER — Telehealth: Payer: Self-pay | Admitting: Orthopaedic Surgery

## 2021-12-19 NOTE — Telephone Encounter (Signed)
Pt is going on a trip and feels she will not be able to take the trip due to a lot  of walking and would like a note stating this [phone]

## 2021-12-19 NOTE — Telephone Encounter (Signed)
yes

## 2021-12-21 ENCOUNTER — Telehealth: Payer: Self-pay | Admitting: Orthopaedic Surgery

## 2021-12-21 NOTE — Telephone Encounter (Signed)
Tried to call to get more specifics of what needs to be on the note. No answer. LMOM for her to call me back.

## 2021-12-21 NOTE — Telephone Encounter (Signed)
Spoke with patient. Emailed letter as requested.

## 2021-12-21 NOTE — Telephone Encounter (Signed)
Pt's husband Antony Haste returning call to [employer].He states please call back at [phone].

## 2021-12-22 ENCOUNTER — Ambulatory Visit: Payer: Medicare Other

## 2021-12-22 ENCOUNTER — Ambulatory Visit (INDEPENDENT_AMBULATORY_CARE_PROVIDER_SITE_OTHER): Payer: Medicare Other | Admitting: *Deleted

## 2021-12-22 DIAGNOSIS — Z23 Encounter for immunization: Secondary | ICD-10-CM

## 2021-12-31 ENCOUNTER — Other Ambulatory Visit: Payer: Self-pay | Admitting: Family Medicine

## 2022-01-01 ENCOUNTER — Telehealth: Payer: Self-pay | Admitting: Family Medicine

## 2022-01-01 DIAGNOSIS — Z1231 Encounter for screening mammogram for malignant neoplasm of breast: Secondary | ICD-10-CM | POA: Diagnosis not present

## 2022-01-01 NOTE — Telephone Encounter (Signed)
Patient declined all future AWV do not call

## 2022-01-04 ENCOUNTER — Other Ambulatory Visit: Payer: Self-pay | Admitting: Family Medicine

## 2022-01-07 ENCOUNTER — Other Ambulatory Visit: Payer: Self-pay | Admitting: Family Medicine

## 2022-01-11 DIAGNOSIS — R922 Inconclusive mammogram: Secondary | ICD-10-CM | POA: Diagnosis not present

## 2022-01-11 DIAGNOSIS — R928 Other abnormal and inconclusive findings on diagnostic imaging of breast: Secondary | ICD-10-CM | POA: Diagnosis not present

## 2022-01-11 DIAGNOSIS — R921 Mammographic calcification found on diagnostic imaging of breast: Secondary | ICD-10-CM | POA: Diagnosis not present

## 2022-01-11 LAB — HM MAMMOGRAPHY

## 2022-01-17 ENCOUNTER — Telehealth: Payer: Self-pay | Admitting: Family Medicine

## 2022-01-17 ENCOUNTER — Encounter: Payer: Self-pay | Admitting: Family Medicine

## 2022-01-17 NOTE — Telephone Encounter (Signed)
Called and spoke with pt and informed her that there is nothing listed as "self reported normal" the results I received from [facility] were abstracted which is possibly what the notification was in [place]. Pt states she is not making this up that her result in mychart states "normal mammogram self reported. Pt states she will drive up here so she can show [place].

## 2022-01-17 NOTE — Telephone Encounter (Signed)
Caller States: -pt came into office concerned about results in [place] about 01/11/22 mammography. -BI-RADS "Self Reported Normal" -She is calling to follow up and sort out discrepancy about what [facility] shared with patient and what the PCP team shows.   Caller Requests: -Call back asap.

## 2022-01-22 ENCOUNTER — Other Ambulatory Visit: Payer: Self-pay | Admitting: Radiology

## 2022-01-22 DIAGNOSIS — N6012 Diffuse cystic mastopathy of left breast: Secondary | ICD-10-CM | POA: Diagnosis not present

## 2022-01-22 DIAGNOSIS — R921 Mammographic calcification found on diagnostic imaging of breast: Secondary | ICD-10-CM | POA: Diagnosis not present

## 2022-01-24 ENCOUNTER — Encounter: Payer: Self-pay | Admitting: Family Medicine

## 2022-01-29 ENCOUNTER — Encounter: Payer: Self-pay | Admitting: Family Medicine

## 2022-01-29 ENCOUNTER — Ambulatory Visit (INDEPENDENT_AMBULATORY_CARE_PROVIDER_SITE_OTHER): Payer: Medicare Other | Admitting: Family Medicine

## 2022-01-29 VITALS — BP 123/69 | HR 63 | Temp 97.6°F | Ht 63.0 in | Wt 172.6 lb

## 2022-01-29 DIAGNOSIS — E1169 Type 2 diabetes mellitus with other specified complication: Secondary | ICD-10-CM

## 2022-01-29 DIAGNOSIS — I10 Essential (primary) hypertension: Secondary | ICD-10-CM

## 2022-01-29 DIAGNOSIS — E113299 Type 2 diabetes mellitus with mild nonproliferative diabetic retinopathy without macular edema, unspecified eye: Secondary | ICD-10-CM

## 2022-01-29 DIAGNOSIS — Z79899 Other long term (current) drug therapy: Secondary | ICD-10-CM | POA: Diagnosis not present

## 2022-01-29 DIAGNOSIS — E785 Hyperlipidemia, unspecified: Secondary | ICD-10-CM | POA: Diagnosis not present

## 2022-01-29 LAB — COMPREHENSIVE METABOLIC PANEL
ALT: 18 U/L (ref 0–35)
AST: 16 U/L (ref 0–37)
Albumin: 4.5 g/dL (ref 3.5–5.2)
Alkaline Phosphatase: 54 U/L (ref 39–117)
BUN: 19 mg/dL (ref 6–23)
CO2: 27 mEq/L (ref 19–32)
Calcium: 9.6 mg/dL (ref 8.4–10.5)
Chloride: 100 mEq/L (ref 96–112)
Creatinine, Ser: 0.85 mg/dL (ref 0.40–1.20)
GFR: 68.61 mL/min (ref >60.00)
Glucose, Bld: 201 mg/dL — ABNORMAL HIGH (ref 70–99)
Potassium: 3.8 mEq/L (ref 3.5–5.1)
Sodium: 136 mEq/L (ref 135–145)
Total Bilirubin: 0.6 mg/dL (ref 0.2–1.2)
Total Protein: 7.2 g/dL (ref 6.0–8.3)

## 2022-01-29 LAB — VITAMIN B12: Vitamin B-12: 1324 pg/mL — ABNORMAL HIGH (ref 211–911)

## 2022-01-29 LAB — LDL CHOLESTEROL, DIRECT: Direct LDL: 60 mg/dL

## 2022-01-29 LAB — HEMOGLOBIN A1C: Hgb A1c MFr Bld: 7.4 % — ABNORMAL HIGH (ref 4.6–6.5)

## 2022-01-29 NOTE — Progress Notes (Signed)
Phone [phone] In person visit   Subjective:   Alexandra Fletcher is a 71 y.o. year old very pleasant female patient who presents for/with See problem oriented charting Chief Complaint  Patient presents with   Follow-up   Hyperlipidemia   Hypertension   Diabetes   Past Medical History-  Patient Active Problem List   Diagnosis Date Noted   History of CVA - Acute right arterial ischemic stroke, PCA (posterior cerebral artery) 01/04/2013    Priority: High   Type 2 diabetes mellitus with ophthalmic complication ([place]) 16/94/5038    Priority: High   Diabetic retinopathy ([place]) 12/19/2014    Priority: Medium    Hyperlipidemia associated with type 2 diabetes mellitus ([place]) 08/16/2009    Priority: Medium    Essential hypertension 08/16/2009    Priority: Medium    Dysphagia, pharyngoesophageal phase 02/21/2015    Priority: Low   Eosinophilia 12/03/2011    Priority: Low   Sinus tarsi syndrome 01/31/2011    Priority: Low   Baker's cyst of knee 10/10/2010    Priority: Low   History of colonic polyps 08/16/2009    Priority: [place], BENIGN 05/16/2007    Priority: Low   History of abnormal cervical Pap smear 04/21/2007    Priority: Low    Medications- reviewed and updated Current Outpatient Medications  Medication Sig Dispense Refill   amLODipine (NORVASC) 10 MG tablet TAKE 1 TABLET (10 MG TOTAL) BY MOUTH DAILY. 90 tablet 3   aspirin EC 81 MG tablet Take 81 mg by mouth daily.     atorvastatin (LIPITOR) 20 MG tablet Take 1 tablet 5 days a week. 65 tablet 3   Blood Glucose Monitoring Suppl (ONE TOUCH ULTRA 2) w/Device KIT USE TO TEST BLOOD SUGAR TWO TO THREE TIMES DAILY 1 kit 0   carvedilol (COREG) 6.25 MG tablet TAKE 1 TABLET BY MOUTH TWICE A DAY 180 tablet 0   glipiZIDE (GLUCOTROL XL) 10 MG 24 hr tablet TAKE ONE TABLET BY MOUTH DAILY 90 tablet 2   glucose blood (ONETOUCH ULTRA) test strip USE TO TEST BLOOD GLUCOSE 2 TO 3 TIMES DAILY AS DIRECTED 100 strip 2    JANUVIA 100 MG tablet TAKE ONE TABLET BY MOUTH DAILY 90 tablet 1   metFORMIN (GLUCOPHAGE) 1000 MG tablet TAKE ONE TABLET BY MOUTH EVERY MORNING, TAKE 1/2 TABLET BY MOUTH DAILY AT LUNCH, AND TAKE ONE TABLET BY MOUTH DAILY AT DINNER 225 tablet 3   OneTouch Delica Lancets 88K MISC USE TO TEST BLOOD SUGAR TWO TO THREE TIMES DAILY 100 each 1   pantoprazole (PROTONIX) 40 MG tablet Take 1 tablet by mouth daily.     vitamin B-12 (CYANOCOBALAMIN) 100 MCG tablet      No current facility-administered medications for this visit.     Objective:  BP 123/69 Comment: average of 9 last home readings  Pulse 63   Temp 97.6 F (36.4 C)   Ht 5' 3" (1.6 m)   Wt 172 lb 9.6 oz (78.3 kg)   LMP  (LMP Unknown)   SpO2 97%   BMI 30.57 kg/m  Gen: NAD, resting comfortably CV: RRR no murmurs rubs or gallops Lungs: CTAB no crackles, wheeze, rhonchi Ext: trace edema Skin: warm, dry  Diabetic Foot Exam - Simple   Simple Foot Form Diabetic Foot exam was performed with the following findings: Yes 01/29/2022  8:43 AM  Visual Inspection No deformities, no ulcerations, no other skin breakdown bilaterally: Yes Sensation Testing Intact to touch and  monofilament testing bilaterally: Yes Pulse Check Posterior Tibialis and Dorsalis pulse intact bilaterally: Yes Comments        Assessment and Plan   # Diabetes-typically well controlled with A1c 7 or less S: Medication: Januvia 100Mg daily, Metformin 1000Mg - 1g AM, 552m lunch, 1 g PM, glipizide 10Mg extended release -Takes B12 with long-term metformin use   Lab Results  Component Value Date   HGBA1C 6.8 (H) 07/25/2021   HGBA1C 7.4 (H) 01/10/2021   HGBA1C 7.4 (H) 07/11/2020  CBGs- 30 day average of 154- up slightly- feels some variations with this season. 7 day average down to 139. Stressful period with mammogram may have influenced this (thankful mammogram came back benign) Exercise and diet- remains very active in the pool, knee slows her some, overall  healthy other than seasonal variations A/P: hopefully stable- update a1c  today. Continue current meds for now  #hypertension with whitecoat element S: medication: Amlodipine 10 mg, carvedilol 6.262mtwice daily Home readings #s: readings below from last 9 checks  BP Readings from Last 3 Encounters:  01/29/22 123/69  07/25/21 116/63  03/19/21 110/80   A/P: Controlled. Continue current medications other than we are going to trial 5 mg of amlodipine for 2 weeks and update me with readings with how good home readings look and fact she continues to deal with swelling in legs worse as day goes on   #hyperlipidemia  S: Medication: atorvastatin 20Mg 5 times a week, prefers aspirin for primary prevention Lab Results  Component Value Date   CHOL 160 07/25/2021   HDL 67.70 07/25/2021   LDLCALC 81 07/25/2021   LDLDIRECT 96.0 01/10/2021   TRIG 56.0 07/25/2021   CHOLHDL 2 07/25/2021  A/P: hopefully improved- update ldl with labs today- for now continue current meds  #B12 relative deficiency- as low as 234 in past- still high last visit and down to 1000 mcg 3 days a week- update with labs- did start PPI in december which could lower as well  # GERD with history of impaction - on pantoprazole 40 mg now down to 20 mg S:Medication: pantoprazole 40 mg- with history of impaction . Has follow up in may and could adjust at that time A/P: overall stable- continue current meds   Recommended follow up: Return in about 6 months (around 07/30/2022) for physical or sooner if needed.Schedule b4 you leave.  Lab/Order associations:   ICD-10-CM   1. Type 2 diabetes mellitus with mild nonproliferative retinopathy without macular edema, without long-term current use of insulin, unspecified laterality ([place] E11.3299     2. Hyperlipidemia associated with type 2 diabetes mellitus ([place] E11.69    E78.5     3. Essential hypertension  I10     4. High risk medication use  Z79.899       No orders of the defined  types were placed in this encounter.   Return precautions advised.  StGarret ReddishMD

## 2022-01-29 NOTE — Patient Instructions (Addendum)
You are eligible to schedule your annual wellness visit with our nurse specialist Otila Kluver.  Please consider scheduling this before you leave today  we are going to trial 5 mg of amlodipine for 2 weeks and update me with readings with how good home readings look and fact she continues to deal with swelling in legs worse as day goes on  -goal at home <135/85  Please stop by lab before you go If you have mychart- we will send your results within 3 business days of [place] receiving them.  If you do not have mychart- we will call you about results within 5 business days of [place] receiving them.  *please also note that you will see labs on mychart as soon as they post. I will later go in and write notes on them- will say "notes from Dr. Yong Channel"   Recommended follow up: Return in about 6 months (around 07/30/2022) for physical or sooner if needed.Schedule b4 you leave.

## 2022-02-01 ENCOUNTER — Other Ambulatory Visit: Payer: Self-pay | Admitting: Family Medicine

## 2022-02-11 ENCOUNTER — Encounter: Payer: Self-pay | Admitting: Family Medicine

## 2022-02-18 ENCOUNTER — Other Ambulatory Visit: Payer: Self-pay | Admitting: Family Medicine

## 2022-02-20 ENCOUNTER — Telehealth: Payer: Self-pay | Admitting: Family Medicine

## 2022-02-20 NOTE — Telephone Encounter (Signed)
I have not received any paperwork-I signed a mammogram order earlier today I believe for her-please confirm and otherwise please another copy on my desk

## 2022-02-20 NOTE — Telephone Encounter (Signed)
Caller states: -Sent "order of request for a biopsy" on 01/11/22 -Recall order for diagnostic was received. -Will refax today 02/20/22  Caller Requests: -Call back if form is not received.   Awaiting fax to come through.

## 2022-02-20 NOTE — Telephone Encounter (Signed)
Yes, I faxed that mammogram over to [place] @ 11:23am

## 2022-02-27 DIAGNOSIS — D0439 Carcinoma in situ of skin of other parts of face: Secondary | ICD-10-CM | POA: Diagnosis not present

## 2022-03-31 ENCOUNTER — Other Ambulatory Visit: Payer: Self-pay | Admitting: Family Medicine

## 2022-05-11 DIAGNOSIS — H52203 Unspecified astigmatism, bilateral: Secondary | ICD-10-CM | POA: Diagnosis not present

## 2022-05-11 DIAGNOSIS — Z0100 Encounter for examination of eyes and vision without abnormal findings: Secondary | ICD-10-CM | POA: Diagnosis not present

## 2022-05-11 DIAGNOSIS — H5213 Myopia, bilateral: Secondary | ICD-10-CM | POA: Diagnosis not present

## 2022-05-11 DIAGNOSIS — H524 Presbyopia: Secondary | ICD-10-CM | POA: Diagnosis not present

## 2022-05-19 ENCOUNTER — Other Ambulatory Visit: Payer: Self-pay | Admitting: Family Medicine

## 2022-06-14 ENCOUNTER — Other Ambulatory Visit: Payer: Self-pay | Admitting: Family Medicine

## 2022-06-15 ENCOUNTER — Other Ambulatory Visit: Payer: Self-pay | Admitting: Family Medicine

## 2022-06-27 ENCOUNTER — Encounter: Payer: Self-pay | Admitting: Gastroenterology

## 2022-07-09 ENCOUNTER — Other Ambulatory Visit: Payer: Self-pay | Admitting: Family Medicine

## 2022-08-01 ENCOUNTER — Ambulatory Visit: Payer: Medicare HMO | Admitting: Family Medicine

## 2022-08-01 ENCOUNTER — Encounter: Payer: Self-pay | Admitting: Family Medicine

## 2022-08-01 VITALS — BP 126/72 | HR 85 | Temp 98.1°F | Ht 63.0 in | Wt 173.4 lb

## 2022-08-01 DIAGNOSIS — I10 Essential (primary) hypertension: Secondary | ICD-10-CM | POA: Diagnosis not present

## 2022-08-01 DIAGNOSIS — Z79899 Other long term (current) drug therapy: Secondary | ICD-10-CM

## 2022-08-01 DIAGNOSIS — E785 Hyperlipidemia, unspecified: Secondary | ICD-10-CM | POA: Diagnosis not present

## 2022-08-01 DIAGNOSIS — E113299 Type 2 diabetes mellitus with mild nonproliferative diabetic retinopathy without macular edema, unspecified eye: Secondary | ICD-10-CM

## 2022-08-01 DIAGNOSIS — Z0000 Encounter for general adult medical examination without abnormal findings: Secondary | ICD-10-CM

## 2022-08-01 DIAGNOSIS — Z7984 Long term (current) use of oral hypoglycemic drugs: Secondary | ICD-10-CM | POA: Diagnosis not present

## 2022-08-01 DIAGNOSIS — E113293 Type 2 diabetes mellitus with mild nonproliferative diabetic retinopathy without macular edema, bilateral: Secondary | ICD-10-CM

## 2022-08-01 DIAGNOSIS — M1711 Unilateral primary osteoarthritis, right knee: Secondary | ICD-10-CM

## 2022-08-01 DIAGNOSIS — M25561 Pain in right knee: Secondary | ICD-10-CM | POA: Diagnosis not present

## 2022-08-01 DIAGNOSIS — E1169 Type 2 diabetes mellitus with other specified complication: Secondary | ICD-10-CM | POA: Diagnosis not present

## 2022-08-01 LAB — COMPREHENSIVE METABOLIC PANEL
ALT: 20 U/L (ref 0–35)
AST: 16 U/L (ref 0–37)
Albumin: 4.5 g/dL (ref 3.5–5.2)
Alkaline Phosphatase: 63 U/L (ref 39–117)
BUN: 26 mg/dL — ABNORMAL HIGH (ref 6–23)
CO2: 27 mEq/L (ref 19–32)
Calcium: 9.9 mg/dL (ref 8.4–10.5)
Chloride: 98 mEq/L (ref 96–112)
Creatinine, Ser: 0.91 mg/dL (ref 0.40–1.20)
GFR: 63 mL/min (ref >60.00)
Glucose, Bld: 155 mg/dL — ABNORMAL HIGH (ref 70–99)
Potassium: 4 mEq/L (ref 3.5–5.1)
Sodium: 136 mEq/L (ref 135–145)
Total Bilirubin: 0.6 mg/dL (ref 0.2–1.2)
Total Protein: 7.2 g/dL (ref 6.0–8.3)

## 2022-08-01 LAB — CBC WITH DIFFERENTIAL/PLATELET
Basophils Absolute: 0.1 10*3/uL (ref 0.0–0.1)
Basophils Relative: 1 % (ref 0.0–3.0)
Eosinophils Absolute: 0.5 10*3/uL (ref 0.0–0.7)
Eosinophils Relative: 8.9 % — ABNORMAL HIGH (ref 0.0–5.0)
HCT: 39.6 % (ref 36.0–46.0)
Hemoglobin: 13.4 g/dL (ref 12.0–15.0)
Lymphocytes Relative: 26.3 % (ref 12.0–46.0)
Lymphs Abs: 1.6 10*3/uL (ref 0.7–4.0)
MCHC: 33.8 g/dL (ref 30.0–36.0)
MCV: 87.6 fl (ref 78.0–100.0)
Monocytes Absolute: 0.4 10*3/uL (ref 0.1–1.0)
Monocytes Relative: 7.4 % (ref 3.0–12.0)
Neutro Abs: 3.4 10*3/uL (ref 1.4–7.7)
Neutrophils Relative %: 56.4 % (ref 43.0–77.0)
Platelets: 275 10*3/uL (ref 150.0–400.0)
RBC: 4.52 Mil/uL (ref 3.87–5.11)
RDW: 13.6 % (ref 11.5–15.5)
WBC: 6.1 10*3/uL (ref 4.0–10.5)

## 2022-08-01 LAB — MICROALBUMIN / CREATININE URINE RATIO
Creatinine,U: 74.3 mg/dL
Microalb Creat Ratio: 2.8 mg/g (ref 0.0–30.0)
Microalb, Ur: 2.1 mg/dL — ABNORMAL HIGH (ref 0.0–1.9)

## 2022-08-01 LAB — LIPID PANEL
Cholesterol: 142 mg/dL (ref 0–200)
HDL: 66.5 mg/dL (ref >39.00)
LDL Cholesterol: 63 mg/dL (ref 0–99)
NonHDL: 75.24
Total CHOL/HDL Ratio: 2
Triglycerides: 61 mg/dL (ref 0.0–149.0)
VLDL: 12.2 mg/dL (ref 0.0–40.0)

## 2022-08-01 LAB — VITAMIN B12: Vitamin B-12: 1233 pg/mL — ABNORMAL HIGH (ref 211–911)

## 2022-08-01 LAB — HEMOGLOBIN A1C: Hgb A1c MFr Bld: 7.5 % — ABNORMAL HIGH (ref 4.6–6.5)

## 2022-08-01 MED ORDER — ATORVASTATIN CALCIUM 20 MG PO TABS: ORAL_TABLET | ORAL | 3 refills | Status: DC

## 2022-08-01 MED ORDER — AMLODIPINE BESYLATE 5 MG PO TABS: 5.0000 mg | ORAL_TABLET | Freq: Two times a day (BID) | ORAL | 3 refills | Status: DC

## 2022-08-01 MED ORDER — GLIPIZIDE ER 10 MG PO TB24: 10.0000 mg | ORAL_TABLET | Freq: Two times a day (BID) | ORAL | 3 refills | Status: DC

## 2022-08-01 NOTE — Patient Instructions (Addendum)
You are eligible to schedule your annual wellness visit with our nurse specialist Inetta Fermo.  Please consider scheduling this before you leave today  We have placed a referral for you today to Dr. Lequita Halt.you should receive a mychart message or phone call within a week with the # to call- or you could simply call them directly in a week  a1c has been slighlty high and she is concerned may be higher- we are going to increase glipizide ER to twice daily at 10 mg still and follow up in 4 months - otherwise continue current medications   Please stop by lab before you go If you have mychart- we will send your results within 3 business days of [place] receiving them.  If you do not have mychart- we will call you about results within 5 business days of [place] receiving them.  *please also note that you will see labs on mychart as soon as they post. I will later go in and write notes on them- will say "notes from Dr. Durene Cal"   Recommended follow up: Return in about 4 months (around 12/02/2022) for followup or sooner if needed.Schedule b4 you leave.

## 2022-08-01 NOTE — Progress Notes (Signed)
Phone [phone]   Subjective:  Patient presents today for their annual physical. Chief complaint-noted.   See problem oriented charting- ROS- full  review of systems was completed and negative except for: ear pain and sicharge on the right- stays wet with swimming, right kne epain   The following were reviewed and entered/updated in epic: Past Medical History:  Diagnosis Date   Allergy    Arthritis    Cataract    mild x both eyes    Cerebral thrombosis with cerebral infarction [facility]) 2002   diagnosis needs confirmed by review of [facility] records   Diabetes mellitus    Heart murmur    1x per Dr. Alwyn Ren- not recurrent   Hyperlipidemia    Hypertension    Partial rupture of Achilles tendon    After fluorquinolone use   Patient Active Problem List   Diagnosis Date Noted   History of CVA - Acute right arterial ischemic stroke, PCA (posterior cerebral artery) 01/04/2013    Priority: High   Type 2 diabetes mellitus with ophthalmic complication (HCC) 01/23/2007    Priority: High   Diabetic retinopathy (HCC) 12/19/2014    Priority: Medium    Hyperlipidemia associated with type 2 diabetes mellitus (HCC) 08/16/2009    Priority: Medium    Essential hypertension 08/16/2009    Priority: Medium    Dysphagia, pharyngoesophageal phase 02/21/2015    Priority: Low   Eosinophilia 12/03/2011    Priority: Low   Sinus tarsi syndrome 01/31/2011    Priority: Low   Baker's cyst of knee 10/10/2010    Priority: Low   History of colonic polyps 08/16/2009    Priority: Low   HEART MURMUR, BENIGN 05/16/2007    Priority: Low   History of abnormal cervical Pap smear 04/21/2007    Priority: Low   Past Surgical History:  Procedure Laterality Date   BIOPSY  03/19/2021   Procedure: BIOPSY;  Surgeon: Vida Rigger, MD;  Location: [facility];  Service: Endoscopy;;   CATARACT EXTRACTION, BILATERAL     COLONOSCOPY  2008   neg   DILATION AND CURETTAGE OF UTERUS     X2    ESOPHAGOGASTRODUODENOSCOPY N/A 03/19/2021   Procedure: ESOPHAGOGASTRODUODENOSCOPY (EGD);  Surgeon: Vida Rigger, MD;  Location: [facility];  Service: Endoscopy;  Laterality: N/A;   FOREIGN BODY REMOVAL  03/19/2021   Procedure: FOREIGN BODY REMOVAL;  Surgeon: Vida Rigger, MD;  Location: [facility];  Service: Endoscopy;;   KNEE ARTHROSCOPY W/ MENISCAL REPAIR  2008    Dr Earl Many knee   Ovarian cysts removed      Dr Randell Patient   POLYPECTOMY  2003   polypoid colonic mucosa/ 2008 no polyps   THUMB SURGERY  1983   Left; post skiing accident in [place]    Family History  Problem Relation Age of Onset   Liver disease Mother        ? malignancy after Hepatitis   Diabetes Father    Heart attack Father        CBAG @ 85   Multiple myeloma Father    Hyperlipidemia Father    Hypertension Father    Diabetes Brother        Type 1 DM   Heart attack Brother 78   Stroke Maternal Aunt        > 81   Breast cancer Cousin    Other Sister        died as child from pneumonia   Healthy Brother    Colon cancer Neg  Hx    Colon polyps Neg Hx    Esophageal cancer Neg Hx    Rectal cancer Neg Hx    Stomach cancer Neg Hx     Medications- reviewed and updated Current Outpatient Medications  Medication Sig Dispense Refill   amLODipine (NORVASC) 5 MG tablet TAKE 2 TABLETS BY MOUTH DAILY 180 tablet 0   aspirin EC 81 MG tablet Take 81 mg by mouth daily.     atorvastatin (LIPITOR) 20 MG tablet TAKE 1 TABLET BY MOUTH 5 DAYS PER WEEK 65 tablet 3   Blood Glucose Monitoring Suppl (ONE TOUCH ULTRA 2) w/Device KIT USE TO TEST BLOOD SUGAR TWO TO THREE TIMES DAILY 1 kit 0   carvedilol (COREG) 6.25 MG tablet TAKE 1 TABLET BY MOUTH TWICE A DAY 180 tablet 0   glipiZIDE (GLUCOTROL XL) 10 MG 24 hr tablet TAKE 1 TABLET BY MOUTH DAILY 90 tablet 2   JANUVIA 100 MG tablet TAKE 1 TABLET BY MOUTH DAILY 90 tablet 1   metFORMIN (GLUCOPHAGE) 1000 MG tablet TAKE ONE TABLET BY MOUTH EVERY MORNING, TAKE 1/2 TABLET BY MOUTH DAILY AT  LUNCH, AND TAKE ONE TABLET BY MOUTH DAILY AT DINNER 225 tablet 3   OneTouch Delica Lancets 33G MISC USE TO TEST BLOOD SUGAR TWO TO THREE TIMES DAILY 100 each 1   ONETOUCH ULTRA test strip USE AS DIRECTED TWO TO THREE TIMES DAILY TO TEST BLOOD GLUCOSE 100 strip 2   pantoprazole (PROTONIX) 20 MG tablet Take 20 mg by mouth daily.     vitamin B-12 (CYANOCOBALAMIN) 100 MCG tablet      pantoprazole (PROTONIX) 40 MG tablet Take 1 tablet by mouth daily.     No current facility-administered medications for this visit.    Allergies-reviewed and updated Allergies  Allergen Reactions   Lisinopril     Swelling of tongue   Moxifloxacin     Achilles tendon rupture potentially   Pioglitazone     REACTION: weight gain & ankle edema    Social History   Social History Narrative   Married 1976. 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 Oropharynx normal, bilaterally cerumen noted Neck: no thyromegaly CV: RRR no murmurs rubs or gallops Lungs: CTAB no crackles, wheeze, rhonchi Abdomen: soft/nontender/nondistended/normal bowel sounds. No rebound or guarding.  Ext: no edema Skin: warm, dry Neuro: grossly normal, moves all extremities, PERRLA   Assessment and Plan   72 y.o. female presenting for annual physical.  Health Maintenance counseling: 1. Anticipatory guidance: Patient counseled regarding regular dental exams -q6 months, eye exams - at least yearly,  avoiding smoking and second hand smoke , limiting alcohol to 1 beverage per day- very rare , no  illicit drugs .   2. Risk factor reduction:  Advised patient of need for regular exercise and diet rich and fruits and vegetables to reduce risk of heart attack and stroke.  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  Diet/weight management-within 2 lbs of last year/reasonably stable- has tried to tighten as sugars have increased.  Wt Readings from Last 3 Encounters:  08/01/22 173 lb 6.4 oz (78.7 kg)  01/29/22 172 lb 9.6 oz (78.3 kg)  07/25/21 171 lb 3.2 oz (77.7 kg)  3. Immunizations/screenings/ancillary studies- opts out of future COVID immunizations. Opt out of prevnar 20 for now Immunization History  Administered Date(s) Administered   Fluad Quad(high Dose 65+) 12/15/2019, 12/26/2020, 12/22/2021   Influenza Split 02/22/2011, 12/03/2011   Influenza Whole 01/15/2007, 12/17/2007, 03/03/2008, 12/07/2009   Influenza, High Dose Seasonal PF 01/13/2016, 12/27/2016, 12/10/2017   Influenza,inj,Quad PF,6+ Mos 01/20/2013   Influenza-Unspecified 12/30/2014, 11/06/2018   PFIZER(Purple Top)SARS-COV-2 Vaccination 04/04/2019, 04/22/2019, 12/15/2019   Pneumococcal Conjugate-13 02/21/2015   Pneumococcal Polysaccharide-23 01/24/2017   Td 05/21/2002   Tdap 02/11/2013   Zoster Recombinat (Shingrix) 02/12/2017, 06/03/2017   Zoster, Live 01/05/2011   4. Cervical cancer screening- past age based screening recommendations - no vaginal bleeding or discharge 5. Breast cancer screening-  breast exam - self exams- and mammogram 01/11/22 include biopsy 6. Colon cancer screening - scheduled likely in June for next colonoscopy. May 2019 last colonoscopy with Dr. Ewing Schlein 7. Skin cancer screening- Sees Dr. Karlyn Agee- has upcoming cyst removal. advised regular sunscreen use. Denies worrisome, changing, or new skin lesions.  8. Birth control/STD check- monogamous/postmenopausl 9. Osteoporosis screening at 62- normal age 23-69- holding off at least 5-10 years  with ongoing weight lifting with kids!  10. Smoking associated screening - never smoker  Status of chronic or acute concerns   # Right knee pain- Dr. Cleophas Dunker years ago did arthroscropic repair and then Dr. Roda Shutters at later date in 2022- partial medial meniscectomy- would like to Get Dr. Lequita Halt opinion- referral placed today  # Diabetes-typically well controlled with A1c 7 or less S: Medication: Januvia 100Mg  daily, Metformin 1000Mg  - 1g AM, 500mg  lunch, 1 g PM, glipizide 10Mg  extended release. No low blood sugars.  - 30 day ag 152- then with adding the glipizide twice daily got down to 127 for 7 day average -Takes B12 with long-term metformin use-2 days a week   CBGs- has noted more and higher spikes. Fasting 120 or below.  Exercise and diet- has tried to buckle down on diet Lab Results  Component Value Date   HGBA1C 7.4 (H) 01/29/2022   HGBA1C 6.8 (H) 07/25/2021   HGBA1C 7.4 (H) 01/10/2021  A/P: a1c has been slighlty high and she is concerned may be higher- we are going to increase glipizide ER to twice daily at 10 mg still and follow up in 4 months  -she may cut noontime metformin- not sure if getting high benefit -also considered rybelsus  # Diabetic retinopathy-close follow-up with ophthalmology- June 24th next visit   #hypertension with whitecoat element S: medication: Amlodipine 5 mg twice daily (some edema but toelrates, carvedilol 6.25mg  twice daily BP Readings from Last 3 Encounters:  08/01/22 126/72  01/29/22 123/69  07/25/21 116/63  A/P: stable- continue current medicines   # History of CVA #hyperlipidemia  S: Medication: atorvastatin 20Mg  5 times a week, aspirin 81 due to stroke history Lab Results  Component Value Date   CHOL 160 07/25/2021   HDL 67.70 07/25/2021   LDLCALC 81 07/25/2021   LDLDIRECT 60.0 01/29/2022   TRIG 56.0 07/25/2021   CHOLHDL 2 07/25/2021  A/P:  hopefully stable- update lipid today. Continue current meds for  now  include aspirin for cva  history  # GERD with history of impaction - on pantoprazole 20 mg (prior 40) with Dr. Ewing Schlein   Recommended follow up: Return in about 4 months (around 12/02/2022) for followup or sooner if needed.Schedule b4 you leave.  Lab/Order associations:NOT fasting- small yogurt   ICD-10-CM   1. Preventative health care  Z00.00     2. Right knee pain, unspecified chronicity  M25.561     3. Type 2 diabetes mellitus with mild nonproliferative retinopathy without macular edema, without long-term current use of insulin, unspecified laterality (HCC)  E11.3299     4. Mild nonproliferative diabetic retinopathy of both eyes without macular edema associated with type 2 diabetes mellitus (HCC)  O13.0865     5. Essential hypertension  I10     6. Hyperlipidemia associated with type 2 diabetes mellitus (HCC)  E11.69    E78.5     7. High risk medication use  Z79.899     8. Arthritis of right knee  M17.11       No orders of the defined types were placed in this encounter.   Return precautions advised.  Tana Conch, MD

## 2022-08-28 DIAGNOSIS — L720 Epidermal cyst: Secondary | ICD-10-CM | POA: Diagnosis not present

## 2022-08-28 DIAGNOSIS — D485 Neoplasm of uncertain behavior of skin: Secondary | ICD-10-CM | POA: Diagnosis not present

## 2022-09-05 DIAGNOSIS — K5730 Diverticulosis of large intestine without perforation or abscess without bleeding: Secondary | ICD-10-CM | POA: Diagnosis not present

## 2022-09-05 DIAGNOSIS — K649 Unspecified hemorrhoids: Secondary | ICD-10-CM | POA: Diagnosis not present

## 2022-09-05 DIAGNOSIS — Z09 Encounter for follow-up examination after completed treatment for conditions other than malignant neoplasm: Secondary | ICD-10-CM | POA: Diagnosis not present

## 2022-09-05 DIAGNOSIS — D128 Benign neoplasm of rectum: Secondary | ICD-10-CM | POA: Diagnosis not present

## 2022-09-05 DIAGNOSIS — D124 Benign neoplasm of descending colon: Secondary | ICD-10-CM | POA: Diagnosis not present

## 2022-09-05 DIAGNOSIS — Z86010 Personal history of colonic polyps: Secondary | ICD-10-CM | POA: Diagnosis not present

## 2022-09-05 LAB — HM COLONOSCOPY

## 2022-09-07 DIAGNOSIS — D128 Benign neoplasm of rectum: Secondary | ICD-10-CM | POA: Diagnosis not present

## 2022-09-14 ENCOUNTER — Other Ambulatory Visit: Payer: Self-pay | Admitting: Family Medicine

## 2022-09-26 ENCOUNTER — Other Ambulatory Visit: Payer: Self-pay | Admitting: Family Medicine

## 2022-10-18 DIAGNOSIS — H35371 Puckering of macula, right eye: Secondary | ICD-10-CM | POA: Diagnosis not present

## 2022-10-18 DIAGNOSIS — E113293 Type 2 diabetes mellitus with mild nonproliferative diabetic retinopathy without macular edema, bilateral: Secondary | ICD-10-CM | POA: Diagnosis not present

## 2022-10-18 DIAGNOSIS — Z961 Presence of intraocular lens: Secondary | ICD-10-CM | POA: Diagnosis not present

## 2022-10-18 DIAGNOSIS — H35412 Lattice degeneration of retina, left eye: Secondary | ICD-10-CM | POA: Diagnosis not present

## 2022-10-18 LAB — HM DIABETES EYE EXAM

## 2022-10-22 DIAGNOSIS — R69 Illness, unspecified: Secondary | ICD-10-CM | POA: Diagnosis not present

## 2022-10-25 ENCOUNTER — Other Ambulatory Visit: Payer: Self-pay | Admitting: Family Medicine

## 2022-11-01 ENCOUNTER — Encounter (INDEPENDENT_AMBULATORY_CARE_PROVIDER_SITE_OTHER): Payer: Self-pay

## 2022-11-14 DIAGNOSIS — L603 Nail dystrophy: Secondary | ICD-10-CM | POA: Diagnosis not present

## 2022-11-14 DIAGNOSIS — Z85828 Personal history of other malignant neoplasm of skin: Secondary | ICD-10-CM | POA: Diagnosis not present

## 2022-11-14 DIAGNOSIS — R1310 Dysphagia, unspecified: Secondary | ICD-10-CM | POA: Diagnosis not present

## 2022-11-14 DIAGNOSIS — L570 Actinic keratosis: Secondary | ICD-10-CM | POA: Diagnosis not present

## 2022-11-14 DIAGNOSIS — Z86010 Personal history of colonic polyps: Secondary | ICD-10-CM | POA: Diagnosis not present

## 2022-11-14 DIAGNOSIS — L814 Other melanin hyperpigmentation: Secondary | ICD-10-CM | POA: Diagnosis not present

## 2022-11-14 DIAGNOSIS — K2100 Gastro-esophageal reflux disease with esophagitis, without bleeding: Secondary | ICD-10-CM | POA: Diagnosis not present

## 2022-11-14 DIAGNOSIS — L821 Other seborrheic keratosis: Secondary | ICD-10-CM | POA: Diagnosis not present

## 2022-11-27 ENCOUNTER — Telehealth: Payer: Self-pay | Admitting: Family Medicine

## 2022-11-27 NOTE — Telephone Encounter (Signed)
Patient states she was called and informed that PCP office didn't have any record of her most recent diabetic eye exam. Patient states she had this done this summer by Dr. Marvis Repress @ [facility]. States she will be contacting them to have the results faxed over to [place].

## 2022-11-28 NOTE — Telephone Encounter (Signed)
Noted. I have sent a request for Eye exam record also.

## 2022-12-07 ENCOUNTER — Encounter: Payer: Self-pay | Admitting: Family Medicine

## 2022-12-07 ENCOUNTER — Ambulatory Visit (INDEPENDENT_AMBULATORY_CARE_PROVIDER_SITE_OTHER): Payer: Medicare HMO | Admitting: Family Medicine

## 2022-12-07 VITALS — BP 133/74 | HR 83 | Temp 98.0°F | Ht 63.0 in | Wt 172.0 lb

## 2022-12-07 DIAGNOSIS — M25371 Other instability, right ankle: Secondary | ICD-10-CM | POA: Diagnosis not present

## 2022-12-07 DIAGNOSIS — Z7984 Long term (current) use of oral hypoglycemic drugs: Secondary | ICD-10-CM | POA: Diagnosis not present

## 2022-12-07 DIAGNOSIS — E113299 Type 2 diabetes mellitus with mild nonproliferative diabetic retinopathy without macular edema, unspecified eye: Secondary | ICD-10-CM | POA: Diagnosis not present

## 2022-12-07 DIAGNOSIS — I10 Essential (primary) hypertension: Secondary | ICD-10-CM | POA: Diagnosis not present

## 2022-12-07 DIAGNOSIS — Z23 Encounter for immunization: Secondary | ICD-10-CM | POA: Diagnosis not present

## 2022-12-07 DIAGNOSIS — E1169 Type 2 diabetes mellitus with other specified complication: Secondary | ICD-10-CM | POA: Diagnosis not present

## 2022-12-07 DIAGNOSIS — E785 Hyperlipidemia, unspecified: Secondary | ICD-10-CM | POA: Diagnosis not present

## 2022-12-07 DIAGNOSIS — Z1231 Encounter for screening mammogram for malignant neoplasm of breast: Secondary | ICD-10-CM

## 2022-12-07 LAB — COMPREHENSIVE METABOLIC PANEL
ALT: 18 U/L (ref 0–35)
AST: 16 U/L (ref 0–37)
Albumin: 4.5 g/dL (ref 3.5–5.2)
Alkaline Phosphatase: 59 U/L (ref 39–117)
BUN: 24 mg/dL — ABNORMAL HIGH (ref 6–23)
CO2: 25 mEq/L (ref 19–32)
Calcium: 9.7 mg/dL (ref 8.4–10.5)
Chloride: 98 mEq/L (ref 96–112)
Creatinine, Ser: 0.89 mg/dL (ref 0.40–1.20)
GFR: 64.54 mL/min (ref >60.00)
Glucose, Bld: 190 mg/dL — ABNORMAL HIGH (ref 70–99)
Potassium: 3.8 mEq/L (ref 3.5–5.1)
Sodium: 135 mEq/L (ref 135–145)
Total Bilirubin: 0.8 mg/dL (ref 0.2–1.2)
Total Protein: 7.5 g/dL (ref 6.0–8.3)

## 2022-12-07 LAB — HEMOGLOBIN A1C: Hgb A1c MFr Bld: 7.3 % — ABNORMAL HIGH (ref 4.6–6.5)

## 2022-12-07 NOTE — Patient Instructions (Addendum)
Flu shot before you leave  We have placed a referral for you today to [facility] Dr Victorino Dike. In some cases you will see # listed below- you can call this if you have not heard within a week. If you do not see # listed- you should receive a mychart message or phone call within a week with the # to call directly- call that as soon as you get it. If you are having issues getting scheduled reach out to [place] again.   [facility] Schedule an appointment by calling [phone].  Please stop by lab before you go If you have mychart- we will send your results within 3 business days of [place] receiving them.  If you do not have mychart- we will call you about results within 5 business days of [place] receiving them.  *please also note that you will see labs on mychart as soon as they post. I will later go in and write notes on them- will say "notes from Dr. Durene Cal"   Recommended follow up: Return in about 4 months (around 04/08/2023) for followup or sooner if needed.Schedule b4 you leave.

## 2022-12-07 NOTE — Addendum Note (Signed)
Addended by: Samara Deist on: 12/07/2022 09:28 AM   Modules accepted: Orders

## 2022-12-07 NOTE — Progress Notes (Signed)
Phone [phone] In person visit   Subjective:   Alexandra Fletcher is a 72 y.o. year old very pleasant female patient who presents for/with See problem oriented charting Chief Complaint  Patient presents with   Diabetes    Pt here for 4 month diabetes f/u w.o any complaints    Medical Management of Chronic Issues   Hypertension   Hyperlipidemia   Past Medical History-  Patient Active Problem List   Diagnosis Date Noted   History of CVA - Acute right arterial ischemic stroke, PCA (posterior cerebral artery) 01/04/2013    Priority: High   Type 2 diabetes mellitus with ophthalmic complication (HCC) 01/23/2007    Priority: High   Diabetic retinopathy (HCC) 12/19/2014    Priority: Medium    Hyperlipidemia associated with type 2 diabetes mellitus (HCC) 08/16/2009    Priority: Medium    Essential hypertension 08/16/2009    Priority: Medium    Dysphagia, pharyngoesophageal phase 02/21/2015    Priority: Low   Eosinophilia 12/03/2011    Priority: Low   Sinus tarsi syndrome 01/31/2011    Priority: Low   Baker's cyst of knee 10/10/2010    Priority: Low   History of colonic polyps 08/16/2009    Priority: Low   HEART MURMUR, BENIGN 05/16/2007    Priority: Low   History of abnormal cervical Pap smear 04/21/2007    Priority: Low    Medications- reviewed and updated Current Outpatient Medications  Medication Sig Dispense Refill   amLODipine (NORVASC) 5 MG tablet Take 1 tablet (5 mg total) by mouth in the morning and at bedtime. 180 tablet 3   aspirin EC 81 MG tablet Take 81 mg by mouth daily.     atorvastatin (LIPITOR) 20 MG tablet TAKE 1 TABLET BY MOUTH 5 DAYS PER WEEK 65 tablet 3   Blood Glucose Monitoring Suppl (ONE TOUCH ULTRA 2) w/Device KIT USE TO TEST BLOOD SUGAR TWO TO THREE TIMES DAILY 1 kit 0   carvedilol (COREG) 6.25 MG tablet TAKE 1 TABLET BY MOUTH 2 TIMES A DAY 180 tablet 0   glipiZIDE (GLUCOTROL XL) 10 MG 24 hr tablet Take 1 tablet (10 mg total) by mouth 2  (two) times daily. 180 tablet 3   glucose blood (ONETOUCH ULTRA) test strip USE AS DIRECTED 2 TO 3 TIMES DAILY TO TEST BLOOD GLUCOSE 100 strip 2   JANUVIA 100 MG tablet TAKE 1 TABLET BY MOUTH DAILY 90 tablet 1   metFORMIN (GLUCOPHAGE) 1000 MG tablet TAKE ONE TABLET BY MOUTH EVERY MORNING, TAKE 1/2 TABLET BY MOUTH DAILY AT LUNCH, AND TAKE ONE TABLET BY MOUTH DAILY AT DINNER 225 tablet 3   OneTouch Delica Lancets 33G MISC USE TO TEST BLOOD SUGAR TWO TO THREE TIMES DAILY 100 each 1   pantoprazole (PROTONIX) 20 MG tablet Take 20 mg by mouth daily.     vitamin B-12 (CYANOCOBALAMIN) 100 MCG tablet      No current facility-administered medications for this visit.     Objective:  BP 132/78   Pulse 83   Temp 98 F (36.7 C)   Ht 5\' 3"  (1.6 m)   Wt 172 lb (78 kg)   LMP  (LMP Unknown)   SpO2 95%   BMI 30.47 kg/m  Gen: NAD, resting comfortably CV: RRR no murmurs rubs or gallops Lungs: CTAB no crackles, wheeze, rhonchi Ext: trace edema Skin: warm, dry     Assessment and Plan   #right knee pain- planned to see  Dr. Berton Lan  (3rd  opinion) but ended up having to cancel appointment  -notes history of achilles tear on the right partial on quinolones- that ankle feels less stable in general and notes that the malleolus sticks out more on the right medial side. Thinks may contribute to knee pain.  -will place referral to Dr. Victorino Dike for ankle instability for his opinion- shed like to do that before replacement  # Diabetes-typically well controlled with A1c 7 or less S: Medication: Januvia 100Mg  daily, Metformin 1000Mg  - 1g AM, 500mg  lunch (about 50% of the time), 1 g PM, glipizide 10Mg  extended release twice daily -have considered rybelsus -Takes B12 with long-term metformin use-2 days a week - brings #s from home 7 day average 135, 30 day average 146. Has noted difference with glipizide with fastings- still some spikes after dinners. No lows Lab Results  Component Value Date   HGBA1C 7.5 (H)  08/01/2022   HGBA1C 7.4 (H) 01/29/2022   HGBA1C 6.8 (H) 07/25/2021  A/P: hopefully improved- update a1c today. Continue current meds for now  -she wonders about alternates or coming off of januvia if #s low enough  # Diabetic retinopathy-close follow-up with ophthalmology- recently had follow up - very very mild  #hypertension with whitecoat element S: medication: Amlodipine 5 mg twice daily, carvedilol 6.25mg  twice daily Home readings #s: last home blood pressure reading 133/74 BP Readings from Last 3 Encounters:  12/07/22 132/78  08/01/22 126/72  01/29/22 123/69  A/P: stable- continue current medicines   # History of CVA #hyperlipidemia  S: Medication: atorvastatin 20Mg  5 times a week, aspirin 81 due to stroke history Lab Results  Component Value Date   CHOL 142 08/01/2022   HDL 66.50 08/01/2022   LDLCALC 63 08/01/2022   LDLDIRECT 60.0 01/29/2022   TRIG 61.0 08/01/2022   CHOLHDL 2 08/01/2022  A/P: cholesterol has been well controlled -continue current medications   # GERD with history of impaction - on pantoprazole 20 mg (prior 40) with Dr. Ewing Schlein- had recent visit as well and no recent issues . Has even considred OTC (available over the counter without a prescription) 10 mg  Recommended follow up: Return in about 4 months (around 04/08/2023) for followup or sooner if needed.Schedule b4 you leave.  Lab/Order associations:   ICD-10-CM   1. Type 2 diabetes mellitus with mild nonproliferative retinopathy without macular edema, without long-term current use of insulin, unspecified laterality (HCC)  Z61.0960 Comprehensive metabolic panel    Hemoglobin A1c    2. Essential hypertension  I10     3. Hyperlipidemia associated with type 2 diabetes mellitus (HCC)  E11.69    E78.5     4. Ankle instability, right  M25.371 Ambulatory referral to Orthopedic Surgery      No orders of the defined types were placed in this encounter.   Return precautions advised.  Tana Conch,  MD

## 2022-12-17 DIAGNOSIS — M25571 Pain in right ankle and joints of right foot: Secondary | ICD-10-CM | POA: Diagnosis not present

## 2022-12-17 DIAGNOSIS — M13861 Other specified arthritis, right knee: Secondary | ICD-10-CM | POA: Diagnosis not present

## 2023-01-31 ENCOUNTER — Other Ambulatory Visit: Payer: Self-pay | Admitting: Family Medicine

## 2023-03-22 ENCOUNTER — Other Ambulatory Visit: Payer: Self-pay | Admitting: Family Medicine

## 2023-04-25 ENCOUNTER — Other Ambulatory Visit: Payer: Self-pay | Admitting: Family Medicine

## 2023-05-17 ENCOUNTER — Other Ambulatory Visit: Payer: Self-pay | Admitting: Family Medicine

## 2023-06-18 ENCOUNTER — Other Ambulatory Visit: Payer: Self-pay | Admitting: Family Medicine

## 2023-06-25 ENCOUNTER — Other Ambulatory Visit: Payer: Self-pay | Admitting: Family Medicine

## 2023-06-28 ENCOUNTER — Other Ambulatory Visit: Payer: Self-pay | Admitting: Family Medicine

## 2023-07-20 ENCOUNTER — Other Ambulatory Visit: Payer: Self-pay | Admitting: Family Medicine

## 2023-07-29 ENCOUNTER — Encounter (HOSPITAL_COMMUNITY): Payer: Self-pay

## 2023-08-05 ENCOUNTER — Ambulatory Visit: Payer: Self-pay | Admitting: Family Medicine

## 2023-08-05 ENCOUNTER — Encounter: Payer: Self-pay | Admitting: Family Medicine

## 2023-08-05 VITALS — BP 130/60 | HR 94 | Temp 97.7°F | Ht 63.0 in | Wt 168.0 lb

## 2023-08-05 DIAGNOSIS — E113299 Type 2 diabetes mellitus with mild nonproliferative diabetic retinopathy without macular edema, unspecified eye: Secondary | ICD-10-CM

## 2023-08-05 DIAGNOSIS — E785 Hyperlipidemia, unspecified: Secondary | ICD-10-CM | POA: Diagnosis not present

## 2023-08-05 DIAGNOSIS — Z7984 Long term (current) use of oral hypoglycemic drugs: Secondary | ICD-10-CM | POA: Diagnosis not present

## 2023-08-05 DIAGNOSIS — E1169 Type 2 diabetes mellitus with other specified complication: Secondary | ICD-10-CM

## 2023-08-05 DIAGNOSIS — I10 Essential (primary) hypertension: Secondary | ICD-10-CM

## 2023-08-05 LAB — MICROALBUMIN / CREATININE URINE RATIO
Creatinine,U: 112.8 mg/dL
Microalb Creat Ratio: 694.8 mg/g — ABNORMAL HIGH (ref 0.0–30.0)
Microalb, Ur: 78.4 mg/dL — ABNORMAL HIGH (ref 0.0–1.9)

## 2023-08-05 LAB — CBC WITH DIFFERENTIAL/PLATELET
Basophils Absolute: 0 10*3/uL (ref 0.0–0.1)
Basophils Relative: 0.6 % (ref 0.0–3.0)
Eosinophils Absolute: 0.6 10*3/uL (ref 0.0–0.7)
Eosinophils Relative: 7.6 % — ABNORMAL HIGH (ref 0.0–5.0)
HCT: 39.2 % (ref 36.0–46.0)
Hemoglobin: 13.2 g/dL (ref 12.0–15.0)
Lymphocytes Relative: 20.7 % (ref 12.0–46.0)
Lymphs Abs: 1.6 10*3/uL (ref 0.7–4.0)
MCHC: 33.7 g/dL (ref 30.0–36.0)
MCV: 85.8 fl (ref 78.0–100.0)
Monocytes Absolute: 0.5 10*3/uL (ref 0.1–1.0)
Monocytes Relative: 6 % (ref 3.0–12.0)
Neutro Abs: 5.1 10*3/uL (ref 1.4–7.7)
Neutrophils Relative %: 65.1 % (ref 43.0–77.0)
Platelets: 275 10*3/uL (ref 150.0–400.0)
RBC: 4.57 Mil/uL (ref 3.87–5.11)
RDW: 14.1 % (ref 11.5–15.5)
WBC: 7.8 10*3/uL (ref 4.0–10.5)

## 2023-08-05 LAB — LIPID PANEL
Cholesterol: 175 mg/dL (ref 0–200)
HDL: 74 mg/dL (ref >39.00)
LDL Cholesterol: 88 mg/dL (ref 0–99)
NonHDL: 100.83
Total CHOL/HDL Ratio: 2
Triglycerides: 66 mg/dL (ref 0.0–149.0)
VLDL: 13.2 mg/dL (ref 0.0–40.0)

## 2023-08-05 LAB — COMPREHENSIVE METABOLIC PANEL WITH GFR
ALT: 22 U/L (ref 0–35)
AST: 19 U/L (ref 0–37)
Albumin: 4.8 g/dL (ref 3.5–5.2)
Alkaline Phosphatase: 58 U/L (ref 39–117)
BUN: 25 mg/dL — ABNORMAL HIGH (ref 6–23)
CO2: 26 meq/L (ref 19–32)
Calcium: 9.9 mg/dL (ref 8.4–10.5)
Chloride: 98 meq/L (ref 96–112)
Creatinine, Ser: 0.86 mg/dL (ref 0.40–1.20)
GFR: 66.94 mL/min (ref >60.00)
Glucose, Bld: 228 mg/dL — ABNORMAL HIGH (ref 70–99)
Potassium: 4.1 meq/L (ref 3.5–5.1)
Sodium: 134 meq/L — ABNORMAL LOW (ref 135–145)
Total Bilirubin: 0.6 mg/dL (ref 0.2–1.2)
Total Protein: 8.2 g/dL (ref 6.0–8.3)

## 2023-08-05 LAB — POCT GLYCOSYLATED HEMOGLOBIN (HGB A1C): Hemoglobin A1C: 7.1 % — AB (ref 4.0–5.6)

## 2023-08-05 NOTE — Patient Instructions (Addendum)
 Tetanus, Diphtheria, and Pertussis (Tdap) at pharmacy  Options if a1c goes up would include Rybelsus  (oral) , Ozempic , Mounjaro (injections last 2) but we opted to hold steady for now   Please stop by lab before you go If you have mychart- we will send your results within 3 business days of us  receiving them.  If you do not have mychart- we will call you about results within 5 business days of us  receiving them.  *please also note that you will see labs on mychart as soon as they post. I will later go in and write notes on them- will say "notes from Dr. Arlene Ben"   Recommended follow up: Return in about 4 months (around 12/06/2023) for physical or sooner if needed.Schedule b4 you leave.

## 2023-08-05 NOTE — Progress Notes (Signed)
 Phone [phone] In person visit   Subjective:   Alexandra Fletcher is a 73 y.o. year old very pleasant female patient who presents for/with See problem oriented charting Chief Complaint  Patient presents with   Diabetes   Hyperlipidemia   Hypertension   Medication Consultation    Wanted to talk about med list     Past Medical History-  Patient Active Problem List   Diagnosis Date Noted   History of CVA - Acute right arterial ischemic stroke, PCA (posterior cerebral artery) 01/04/2013    Priority: High   Type 2 diabetes mellitus with ophthalmic complication (HCC) 01/23/2007    Priority: High   Diabetic retinopathy (HCC) 12/19/2014    Priority: Medium    Hyperlipidemia associated with type 2 diabetes mellitus (HCC) 08/16/2009    Priority: Medium    Essential hypertension 08/16/2009    Priority: Medium    Dysphagia, pharyngoesophageal phase 02/21/2015    Priority: Low   Eosinophilia 12/03/2011    Priority: Low   Sinus tarsi syndrome 01/31/2011    Priority: Low   Baker's cyst of knee 10/10/2010    Priority: Low   History of colonic polyps 08/16/2009    Priority: Low   HEART MURMUR, BENIGN 05/16/2007    Priority: Low   History of abnormal cervical Pap smear 04/21/2007    Priority: Low    Medications- reviewed and updated Current Outpatient Medications  Medication Sig Dispense Refill   amLODipine  (NORVASC ) 5 MG tablet Take 1 tablet (5 mg total) by mouth in the morning and at bedtime. 180 tablet 3   aspirin EC 81 MG tablet Take 81 mg by mouth daily.     atorvastatin  (LIPITOR) 20 MG tablet TAKE 1 TABLET BY MOUTH DAILY 5 DAYS EACH WEEK 65 tablet 3   Blood Glucose Monitoring Suppl (ONE TOUCH ULTRA 2) w/Device KIT USE TO TEST BLOOD SUGAR TWO TO THREE TIMES DAILY 1 kit 0   carvedilol  (COREG ) 6.25 MG tablet TAKE 1 TABLET BY MOUTH 2 TIMES A DAY 180 tablet 0   glipiZIDE  (GLUCOTROL  XL) 10 MG 24 hr tablet Take 1 tablet (10 mg total) by mouth 2 (two) times daily. 180  tablet 3   glucose blood (ONETOUCH ULTRA) test strip USE TO TEST BLOOD GLUCOSE AS DIRECTED 2 TO 3 TIMES DAILY 100 strip 2   JANUVIA  100 MG tablet TAKE 1 TABLET BY MOUTH DAILY 90 tablet 1   metFORMIN  (GLUCOPHAGE ) 1000 MG tablet TAKE 1 TABLET BY MOUTH IN THE MORNING, 1/2 TABLET AT LUNCH, AND 1 TABLET AT DINNER 225 tablet 0   OneTouch Delica Lancets 33G MISC USE TO TEST BLOOD SUGAR TWO TO THREE TIMES DAILY 100 each 1   pantoprazole (PROTONIX) 20 MG tablet Take 20 mg by mouth daily.     vitamin B-12 (CYANOCOBALAMIN ) 100 MCG tablet      No current facility-administered medications for this visit.     Objective:  BP 130/60   Pulse 94   Temp 97.7 F (36.5 C)   Wt 168 lb (76.2 kg)   LMP  (LMP Unknown)   SpO2 98%   BMI 29.76 kg/m  Gen: NAD, resting comfortably CV: RRR no murmurs rubs or gallops Lungs: CTAB no crackles, wheeze, rhonchi Ext: no edema Skin: warm, dry  Diabetic foot exam was performed with the following findings:   No deformities, ulcerations, or other skin breakdown Normal sensation of 10g monofilament Intact posterior tibialis and dorsalis pedis pulses        Assessment  and Plan   # Ankle instability-has seen Dr. Rosebud Confer on 12/17/2022 to discuss that as well as right knee pain "  Today I reassured the patient that she does not appear to have any pathology at the level of the ankle or foot on the right. I believe she has varus knee arthritis which is causing compensatory valgus alignment through the hindfoot. She is safe to continue to be as active as she tolerates. I believe she would benefit from seeing Dr. Bernard Brick for evaluation of her knee  " -She had previously gotten 2 other opinions on her knee- wanting to hold off on surgery.   # Diabetes-typically well controlled with A1c 7 or less S: Medication: Januvia  100Mg  daily, Metformin  1000Mg  - 1g AM, 500mg  lunch 60% of the time -have considered Rybelsus  as doesn't tolerate injections  -Takes B12 with long-term  metformin  use-2 days a week  CBGs- 110s fasting and as low as 90 but some spikes in the daytime then comes down Exercise and diet- 2.5 hours in pull 4 days a week Lab Results  Component Value Date   HGBA1C 7.1 (A) 08/05/2023   HGBA1C 7.3 (H) 12/07/2022   HGBA1C 7.5 (H) 08/01/2022  A/P: diabetes reasonable control- continue current medications- wants to continue to work on healthy eating and regular exercise - has done a great job with these  I discussed the microalbumin to creatinine lab error with patient that occurred with [facility] labs.  Essentially the ratio was off by a factor of 10.  In this patient's individual case her most recent reading will adjust to 28 from 2.8 but up to 3 years ago was as high as 145-thankful for improvement- update this today - Potential angioedema on lisinopril  which rules that and ARBs out most likely - she's concerned about urinary tract infection with jardiance but we did discuss Rybelsus  vs Ozempic  or Mounjaro if needed in future  # Diabetic retinopathy-close follow-up with ophthalmology  #hypertension with whitecoat element S: medication: Amlodipine  5 mg twice daily, carvedilol  6.25mg  twice daily A/P: stable- continue current medicines   # History of CVA #hyperlipidemia  S: Medication: atorvastatin  20Mg  5 times a week, aspirin 81 due to stroke history Lab Results  Component Value Date   CHOL 142 08/01/2022   HDL 66.50 08/01/2022   LDLCALC 63 08/01/2022   LDLDIRECT 60.0 01/29/2022   TRIG 61.0 08/01/2022   CHOLHDL 2 08/01/2022  A/P: lipids at goal even for history of stroke (which is now in question- she's going to get me neurology report from this from 2002 when she had blurry vision with high a1c that resolved- I don't have MRI copy )  #stroke history? Apparently was bilateral blurry vision after stroke- optometry thought stroke but inconclusive per neurology notes- she may bring those records and we may be able to adjust   # GERD with history  of impaction - on pantoprazole 20 mg (prior 40) with Dr. Lavaughn [place]  Recommended follow up: Return in about 4 months (around 12/06/2023) for physical or sooner if needed.Schedule b4 you leave.  Lab/Order associations:   ICD-10-CM   1. Type 2 diabetes mellitus with mild nonproliferative retinopathy without macular edema, without long-term current use of insulin, unspecified laterality (HCC)  W09.8119 POCT glycosylated hemoglobin (Hb A1C)    Microalbumin / creatinine urine ratio    Comprehensive metabolic panel with GFR    CBC with Differential/Platelet    Lipid panel    2. Essential hypertension  I10     3.  Hyperlipidemia associated with type 2 diabetes mellitus (HCC)  E11.69    E78.5       No orders of the defined types were placed in this encounter.   Return precautions advised.  Clarisa Crooked, MD

## 2023-08-06 ENCOUNTER — Telehealth: Payer: Self-pay

## 2023-08-06 DIAGNOSIS — I10 Essential (primary) hypertension: Secondary | ICD-10-CM

## 2023-08-06 DIAGNOSIS — E113299 Type 2 diabetes mellitus with mild nonproliferative diabetic retinopathy without macular edema, unspecified eye: Secondary | ICD-10-CM

## 2023-08-06 MED ORDER — KERENDIA 10 MG PO TABS: 10.0000 mg | ORAL_TABLET | Freq: Every day | ORAL | 5 refills | Status: DC

## 2023-08-06 NOTE — Addendum Note (Signed)
 Addended by: Almira Jaeger on: 08/06/2023 05:15 PM   Modules accepted: Orders

## 2023-08-06 NOTE — Telephone Encounter (Signed)
 See below.  Copied from CRM [id number]. Topic: General - Other >> Aug 06, 2023  9:24 AM Albertha Alosa wrote: Reason for CRM: Patient called in stated she would like to try the medication kerendia and any information she can get about the medication will be great , would like for Dr.Hunter or his nurses to give her a callback regarding this

## 2023-08-06 NOTE — Telephone Encounter (Signed)
 She has thought through her options and wants to start Kerendia  -Needs repeat CMP in 1 week-she will call to schedule this a week after starting the medicine -We are also going to obtain a fructosamine as I am concerned her blood sugars may be higher than the A1c suggests at which point we would need to be more aggressive about the diabetes care

## 2023-08-07 ENCOUNTER — Other Ambulatory Visit (HOSPITAL_COMMUNITY): Payer: Self-pay

## 2023-08-07 ENCOUNTER — Telehealth: Payer: Self-pay

## 2023-08-07 NOTE — Telephone Encounter (Signed)
 Pharmacy Patient Advocate Encounter   Received notification from Patient Pharmacy that prior authorization for Kerendia 10 is required/requested.   Insurance verification completed.   The patient is insured through [facility] ADVANTAGE/RX ADVANCE .   Per test claim: PA required; PA submitted to above mentioned insurance via CoverMyMeds Key/confirmation #/EOC [id number] Status is pending

## 2023-08-07 NOTE — Telephone Encounter (Signed)
 Pharmacy Patient Advocate Encounter  Received notification from [facility] ADVANTAGE/RX ADVANCE that Prior Authorization for Kerendia 10 mg tabs has been APPROVED from 08/07/23 to 08/06/24. Ran test claim, Copay is $263.43. This test claim was processed through [facility]- copay amounts may vary at other pharmacies due to pharmacy/plan contracts, or as the patient moves through the different stages of their insurance plan.   PA #/Case ID/Reference #: [id number]

## 2023-08-08 ENCOUNTER — Ambulatory Visit: Payer: Self-pay | Admitting: Family Medicine

## 2023-08-08 ENCOUNTER — Other Ambulatory Visit (INDEPENDENT_AMBULATORY_CARE_PROVIDER_SITE_OTHER)

## 2023-08-08 DIAGNOSIS — I10 Essential (primary) hypertension: Secondary | ICD-10-CM | POA: Diagnosis not present

## 2023-08-08 DIAGNOSIS — R7309 Other abnormal glucose: Secondary | ICD-10-CM | POA: Diagnosis not present

## 2023-08-08 DIAGNOSIS — E113299 Type 2 diabetes mellitus with mild nonproliferative diabetic retinopathy without macular edema, unspecified eye: Secondary | ICD-10-CM | POA: Diagnosis not present

## 2023-08-08 LAB — COMPREHENSIVE METABOLIC PANEL WITH GFR
ALT: 19 U/L (ref 0–35)
AST: 15 U/L (ref 0–37)
Albumin: 4.7 g/dL (ref 3.5–5.2)
Alkaline Phosphatase: 61 U/L (ref 39–117)
BUN: 26 mg/dL — ABNORMAL HIGH (ref 6–23)
CO2: 26 meq/L (ref 19–32)
Calcium: 9.8 mg/dL (ref 8.4–10.5)
Chloride: 99 meq/L (ref 96–112)
Creatinine, Ser: 0.95 mg/dL (ref 0.40–1.20)
GFR: 59.4 mL/min — ABNORMAL LOW (ref >60.00)
Glucose, Bld: 213 mg/dL — ABNORMAL HIGH (ref 70–99)
Potassium: 4 meq/L (ref 3.5–5.1)
Sodium: 135 meq/L (ref 135–145)
Total Bilirubin: 0.5 mg/dL (ref 0.2–1.2)
Total Protein: 7.9 g/dL (ref 6.0–8.3)

## 2023-08-10 LAB — FRUCTOSAMINE: Fructosamine: 322 umol/L — ABNORMAL HIGH (ref 205–285)

## 2023-08-13 ENCOUNTER — Encounter: Payer: Self-pay | Admitting: Family Medicine

## 2023-08-13 DIAGNOSIS — R809 Proteinuria, unspecified: Secondary | ICD-10-CM

## 2023-08-19 ENCOUNTER — Ambulatory Visit: Payer: Self-pay | Admitting: Family Medicine

## 2023-08-19 ENCOUNTER — Other Ambulatory Visit (INDEPENDENT_AMBULATORY_CARE_PROVIDER_SITE_OTHER)

## 2023-08-19 DIAGNOSIS — R809 Proteinuria, unspecified: Secondary | ICD-10-CM | POA: Diagnosis not present

## 2023-08-19 LAB — MICROALBUMIN / CREATININE URINE RATIO
Creatinine,U: 27.4 mg/dL
Microalb Creat Ratio: 162.9 mg/g — ABNORMAL HIGH (ref 0.0–30.0)
Microalb, Ur: 4.5 mg/dL — ABNORMAL HIGH (ref 0.0–1.9)

## 2023-08-23 ENCOUNTER — Telehealth: Payer: Self-pay

## 2023-08-23 ENCOUNTER — Other Ambulatory Visit: Payer: Self-pay

## 2023-08-23 DIAGNOSIS — I10 Essential (primary) hypertension: Secondary | ICD-10-CM

## 2023-08-23 NOTE — Telephone Encounter (Signed)
 Lab orders placed, ok to schedule lab visit.  Copied from CRM [id number]. Topic: Clinical - Request for Lab/Test Order >> Aug 23, 2023 10:33 AM Howard Macho wrote: Reason for CRM: patient called stating the doctor wanted her to make a lab appointment in one week after she started a new medication, but there are no lab orders in CB [phone]

## 2023-08-30 ENCOUNTER — Other Ambulatory Visit (INDEPENDENT_AMBULATORY_CARE_PROVIDER_SITE_OTHER)

## 2023-08-30 ENCOUNTER — Other Ambulatory Visit: Payer: Self-pay | Admitting: Family Medicine

## 2023-08-30 ENCOUNTER — Ambulatory Visit: Payer: Self-pay | Admitting: Family Medicine

## 2023-08-30 DIAGNOSIS — I10 Essential (primary) hypertension: Secondary | ICD-10-CM

## 2023-08-30 LAB — COMPREHENSIVE METABOLIC PANEL WITH GFR
ALT: 21 U/L (ref 0–35)
AST: 16 U/L (ref 0–37)
Albumin: 4.6 g/dL (ref 3.5–5.2)
Alkaline Phosphatase: 56 U/L (ref 39–117)
BUN: 25 mg/dL — ABNORMAL HIGH (ref 6–23)
CO2: 24 meq/L (ref 19–32)
Calcium: 10 mg/dL (ref 8.4–10.5)
Chloride: 99 meq/L (ref 96–112)
Creatinine, Ser: 0.92 mg/dL (ref 0.40–1.20)
GFR: 61.71 mL/min (ref >60.00)
Glucose, Bld: 127 mg/dL — ABNORMAL HIGH (ref 70–99)
Potassium: 4.4 meq/L (ref 3.5–5.1)
Sodium: 134 meq/L — ABNORMAL LOW (ref 135–145)
Total Bilirubin: 0.6 mg/dL (ref 0.2–1.2)
Total Protein: 7.6 g/dL (ref 6.0–8.3)

## 2023-08-31 ENCOUNTER — Other Ambulatory Visit: Payer: Self-pay | Admitting: Family Medicine

## 2023-09-03 ENCOUNTER — Telehealth: Payer: Self-pay | Admitting: Family Medicine

## 2023-09-03 ENCOUNTER — Other Ambulatory Visit: Payer: Self-pay | Admitting: Family Medicine

## 2023-09-03 NOTE — Telephone Encounter (Signed)
 Patient came in office to inquire about her med refill for amlodipine  5 mg. I informed patient of med refill policy and normal processing times, pt verbalized understanding. Patient states she is out of medication.

## 2023-09-03 NOTE — Telephone Encounter (Signed)
 Can a PA be done for this or are a list of options that are on formulary available so Dr. Arlene Ben can know what to chose from?

## 2023-09-03 NOTE — Telephone Encounter (Signed)
 Patient came in office stating she received a letter informing her that the kerendia  she was recently prescribed is not covered. Patient informed me that she would have to pay $262.87 for 30 day supply. She would like to know if there are any substitutions that could be covered by her insurance.

## 2023-09-04 ENCOUNTER — Telehealth: Payer: Self-pay

## 2023-09-04 ENCOUNTER — Other Ambulatory Visit (HOSPITAL_COMMUNITY): Payer: Self-pay

## 2023-09-04 NOTE — Telephone Encounter (Signed)
 See below

## 2023-09-04 NOTE — Telephone Encounter (Signed)
 There are other options but they are more blood pressure or heart failure specific whereas the Kerendia  is the only one that I am aware of that has the renal protection listed specifically so I would not be able to guarantee the benefit on the other 2 that are cheaper

## 2023-09-04 NOTE — Telephone Encounter (Signed)
 Pharmacy Patient Advocate Encounter   Received notification from Pt Calls Messages that prior authorization for Kerendia  10mg  tabs is required/requested.   Insurance verification completed.   The patient is insured through [facility] ADVANTAGE/RX ADVANCE .   Per test claim: The current 30 day co-pay is, $263.46.  No PA needed at this time. This test claim was processed through [facility]- copay amounts may vary at other pharmacies due to pharmacy/plan contracts, or as the patient moves through the different stages of their insurance plan.     -Plan prefers Spironolactone or Eplerenone.

## 2023-09-05 NOTE — Telephone Encounter (Signed)
 Called and spoke with pt and below message given, I have placed a goodrx card up front for pt also to see if this will help with the cost of Kerendia .

## 2023-09-10 NOTE — Telephone Encounter (Signed)
 Please see phone note dated 09/03/2023 about this exact same issue

## 2023-09-10 NOTE — Telephone Encounter (Signed)
**Note De-identified  [place] Obfuscation** Please advise 

## 2023-09-18 ENCOUNTER — Other Ambulatory Visit: Payer: Self-pay | Admitting: Family Medicine

## 2023-10-24 DIAGNOSIS — K2100 Gastro-esophageal reflux disease with esophagitis, without bleeding: Secondary | ICD-10-CM | POA: Diagnosis not present

## 2023-11-07 DIAGNOSIS — Z961 Presence of intraocular lens: Secondary | ICD-10-CM | POA: Diagnosis not present

## 2023-11-07 DIAGNOSIS — H35412 Lattice degeneration of retina, left eye: Secondary | ICD-10-CM | POA: Diagnosis not present

## 2023-11-07 DIAGNOSIS — E113293 Type 2 diabetes mellitus with mild nonproliferative diabetic retinopathy without macular edema, bilateral: Secondary | ICD-10-CM | POA: Diagnosis not present

## 2023-11-07 DIAGNOSIS — H35371 Puckering of macula, right eye: Secondary | ICD-10-CM | POA: Diagnosis not present

## 2023-11-08 ENCOUNTER — Other Ambulatory Visit: Payer: Self-pay | Admitting: Family Medicine

## 2023-11-21 DIAGNOSIS — Z85828 Personal history of other malignant neoplasm of skin: Secondary | ICD-10-CM | POA: Diagnosis not present

## 2023-11-21 DIAGNOSIS — D225 Melanocytic nevi of trunk: Secondary | ICD-10-CM | POA: Diagnosis not present

## 2023-11-21 DIAGNOSIS — L570 Actinic keratosis: Secondary | ICD-10-CM | POA: Diagnosis not present

## 2023-12-05 ENCOUNTER — Other Ambulatory Visit: Payer: Self-pay | Admitting: Family Medicine

## 2023-12-05 DIAGNOSIS — Z1231 Encounter for screening mammogram for malignant neoplasm of breast: Secondary | ICD-10-CM

## 2023-12-10 ENCOUNTER — Other Ambulatory Visit: Payer: Self-pay | Admitting: Family Medicine

## 2023-12-12 ENCOUNTER — Ambulatory Visit
Admission: RE | Admit: 2023-12-12 | Discharge: 2023-12-12 | Disposition: A | Discharge status: Home / Self Care | Source: Ambulatory Visit | Attending: Family Medicine | Admitting: Family Medicine

## 2023-12-12 DIAGNOSIS — Z1231 Encounter for screening mammogram for malignant neoplasm of breast: Secondary | ICD-10-CM | POA: Diagnosis not present

## 2023-12-27 ENCOUNTER — Other Ambulatory Visit: Payer: Self-pay | Admitting: Family Medicine

## 2023-12-31 ENCOUNTER — Encounter: Payer: Self-pay | Admitting: Family Medicine

## 2023-12-31 ENCOUNTER — Ambulatory Visit: Payer: Self-pay | Admitting: Family Medicine

## 2023-12-31 ENCOUNTER — Ambulatory Visit: Admitting: Family Medicine

## 2023-12-31 VITALS — BP 130/80 | HR 96 | Temp 98.1°F | Ht 63.0 in | Wt 167.6 lb

## 2023-12-31 DIAGNOSIS — E785 Hyperlipidemia, unspecified: Secondary | ICD-10-CM

## 2023-12-31 DIAGNOSIS — E1169 Type 2 diabetes mellitus with other specified complication: Secondary | ICD-10-CM

## 2023-12-31 DIAGNOSIS — Z7984 Long term (current) use of oral hypoglycemic drugs: Secondary | ICD-10-CM | POA: Diagnosis not present

## 2023-12-31 DIAGNOSIS — E113299 Type 2 diabetes mellitus with mild nonproliferative diabetic retinopathy without macular edema, unspecified eye: Secondary | ICD-10-CM | POA: Diagnosis not present

## 2023-12-31 DIAGNOSIS — Z0000 Encounter for general adult medical examination without abnormal findings: Secondary | ICD-10-CM | POA: Diagnosis not present

## 2023-12-31 DIAGNOSIS — I10 Essential (primary) hypertension: Secondary | ICD-10-CM | POA: Diagnosis not present

## 2023-12-31 DIAGNOSIS — E113293 Type 2 diabetes mellitus with mild nonproliferative diabetic retinopathy without macular edema, bilateral: Secondary | ICD-10-CM

## 2023-12-31 LAB — COMPREHENSIVE METABOLIC PANEL WITH GFR
ALT: 17 U/L (ref 0–35)
AST: 15 U/L (ref 0–37)
Albumin: 4.8 g/dL (ref 3.5–5.2)
Alkaline Phosphatase: 65 U/L (ref 39–117)
BUN: 27 mg/dL — ABNORMAL HIGH (ref 6–23)
CO2: 24 meq/L (ref 19–32)
Calcium: 9.7 mg/dL (ref 8.4–10.5)
Chloride: 99 meq/L (ref 96–112)
Creatinine, Ser: 0.85 mg/dL (ref 0.40–1.20)
GFR: 67.69 mL/min (ref >60.00)
Glucose, Bld: 117 mg/dL — ABNORMAL HIGH (ref 70–99)
Potassium: 4.1 meq/L (ref 3.5–5.1)
Sodium: 135 meq/L (ref 135–145)
Total Bilirubin: 0.6 mg/dL (ref 0.2–1.2)
Total Protein: 7.4 g/dL (ref 6.0–8.3)

## 2023-12-31 LAB — MICROALBUMIN / CREATININE URINE RATIO
Creatinine,U: 102.9 mg/dL
Microalb Creat Ratio: 59.3 mg/g — ABNORMAL HIGH (ref 0.0–30.0)
Microalb, Ur: 6.1 mg/dL — ABNORMAL HIGH (ref 0.0–1.9)

## 2023-12-31 LAB — CBC WITH DIFFERENTIAL/PLATELET
Basophils Absolute: 0 K/uL (ref 0.0–0.1)
Basophils Relative: 0.6 % (ref 0.0–3.0)
Eosinophils Absolute: 0.3 K/uL (ref 0.0–0.7)
Eosinophils Relative: 4.9 % (ref 0.0–5.0)
HCT: 38.3 % (ref 36.0–46.0)
Hemoglobin: 12.8 g/dL (ref 12.0–15.0)
Lymphocytes Relative: 23.7 % (ref 12.0–46.0)
Lymphs Abs: 1.7 K/uL (ref 0.7–4.0)
MCHC: 33.5 g/dL (ref 30.0–36.0)
MCV: 86.1 fl (ref 78.0–100.0)
Monocytes Absolute: 0.4 K/uL (ref 0.1–1.0)
Monocytes Relative: 5.8 % (ref 3.0–12.0)
Neutro Abs: 4.5 K/uL (ref 1.4–7.7)
Neutrophils Relative %: 65 % (ref 43.0–77.0)
Platelets: 262 K/uL (ref 150.0–400.0)
RBC: 4.45 Mil/uL (ref 3.87–5.11)
RDW: 14.6 % (ref 11.5–15.5)
WBC: 7 K/uL (ref 4.0–10.5)

## 2023-12-31 LAB — HEMOGLOBIN A1C: Hgb A1c MFr Bld: 7.3 % — ABNORMAL HIGH (ref 4.6–6.5)

## 2023-12-31 MED ORDER — KERENDIA 10 MG PO TABS: 10.0000 mg | ORAL_TABLET | Freq: Every day | ORAL | 3 refills | Status: AC

## 2023-12-31 NOTE — Patient Instructions (Addendum)
 Please stop by lab before you go If you have mychart- we will send your results within 3 business days of us  receiving them.  If you do not have mychart- we will call you about results within 5 business days of us  receiving them.  *please also note that you will see labs on mychart as soon as they post. I will later go in and write notes on them- will say notes from Dr. Katrinka Finder you are doing so well  Recommended follow up: Return in about 4 months (around 05/02/2024) for followup or sooner if needed.Schedule b4 you leave.

## 2023-12-31 NOTE — Progress Notes (Signed)
 Phone [phone]   Subjective:  Patient presents today for their annual physical. Chief complaint-noted.   See problem oriented charting- ROS- full  review of systems was completed and negative except for topics noted under acute/chronic concerns   The following were reviewed and entered/updated in epic: Past Medical History:  Diagnosis Date   Allergy    Arthritis    Cataract    mild x both eyes    Cerebral thrombosis with cerebral infarction [facility]) 2002   diagnosis needs confirmed by review of [facility] records   Diabetes mellitus    Heart murmur    1x per Dr. Tish- not recurrent   Hyperlipidemia    Hypertension    Partial rupture of Achilles tendon    After fluorquinolone use   Patient Active Problem List   Diagnosis Date Noted   History of CVA - Acute right arterial ischemic stroke, PCA (posterior cerebral artery) 01/04/2013    Priority: High   Type 2 diabetes mellitus with ophthalmic complication (HCC) 01/23/2007    Priority: High   Diabetic retinopathy (HCC) 12/19/2014    Priority: Medium    Hyperlipidemia associated with type 2 diabetes mellitus (HCC) 08/16/2009    Priority: Medium    Essential hypertension 08/16/2009    Priority: Medium    Dysphagia, pharyngoesophageal phase 02/21/2015    Priority: Low   Eosinophilia 12/03/2011    Priority: Low   Sinus tarsi syndrome 01/31/2011    Priority: Low   Baker's cyst of knee 10/10/2010    Priority: Low   History of colonic polyps 08/16/2009    Priority: Low   HEART MURMUR, BENIGN 05/16/2007    Priority: Low   History of abnormal cervical Pap smear 04/21/2007    Priority: Low   Past Surgical History:  Procedure Laterality Date   BIOPSY  03/19/2021   Procedure: BIOPSY;  Surgeon: Rosalie Kitchens, MD;  Location: [facility];  Service: Endoscopy;;   CATARACT EXTRACTION, BILATERAL     COLONOSCOPY  2008   neg   DILATION AND CURETTAGE OF UTERUS     X2   ESOPHAGOGASTRODUODENOSCOPY N/A 03/19/2021   Procedure:  ESOPHAGOGASTRODUODENOSCOPY (EGD);  Surgeon: Rosalie Kitchens, MD;  Location: [facility];  Service: Endoscopy;  Laterality: N/A;   EYE SURGERY  2001   FOREIGN BODY REMOVAL  03/19/2021   Procedure: FOREIGN BODY REMOVAL;  Surgeon: Rosalie Kitchens, MD;  Location: [facility];  Service: Endoscopy;;   KNEE ARTHROSCOPY W/ MENISCAL REPAIR  2008    Dr Anderson SAUNDERS knee   Ovarian cysts removed      Dr Christophe   POLYPECTOMY  2003   polypoid colonic mucosa/ 2008 no polyps   THUMB SURGERY  1983   Left; post skiing accident in [place]     Family History  Problem Relation Age of Onset   Liver disease Mother        ? malignancy after Hepatitis   Diabetes Father    Heart attack Father        CBAG @ 1   Multiple myeloma Father    Hyperlipidemia Father    Hypertension Father    Diabetes Brother        Type 1 DM   Heart attack Brother 74   Stroke Maternal Aunt        > 12   Breast cancer Cousin    Other Sister        died as child from pneumonia   Healthy Brother    Colon cancer Neg Hx  Colon polyps Neg Hx    Esophageal cancer Neg Hx    Rectal cancer Neg Hx    Stomach cancer Neg Hx     Medications- reviewed and updated Current Outpatient Medications  Medication Sig Dispense Refill   amLODipine  (NORVASC ) 5 MG tablet TAKE 1 TABLET BY MOUTH IN THE MORNING AND AT BEDTIME 180 tablet 3   aspirin EC 81 MG tablet Take 81 mg by mouth daily.     atorvastatin  (LIPITOR) 20 MG tablet TAKE 1 TABLET BY MOUTH DAILY 5 DAYS EACH WEEK 65 tablet 3   carvedilol  (COREG ) 6.25 MG tablet TAKE 1 TABLET BY MOUTH 2 TIMES A DAY 180 tablet 0   glipiZIDE  (GLUCOTROL  XL) 10 MG 24 hr tablet TAKE 1 TABLET BY MOUTH 2 TIMES A DAY 180 tablet 3   JANUVIA  100 MG tablet TAKE 1 TABLET BY MOUTH DAILY 90 tablet 1   metFORMIN  (GLUCOPHAGE ) 1000 MG tablet TAKE ONE (1) TABLET BY MOUTH EVERY MORNING , ONE-HALF (0.5) TABLET AT LUNCH, AND ONE (1) TABLET AT DINNER 225 tablet 0   OneTouch Delica Lancets 33G MISC USE TO TEST BLOOD SUGAR TWO TO  THREE TIMES DAILY 100 each 1   ONETOUCH ULTRA test strip USE TO TEST BLOOD GLUCOSE 2 TO 3 TIMES A DAY AS DIRECTED 100 strip 2   pantoprazole (PROTONIX) 20 MG tablet Take 20 mg by mouth daily.     vitamin B-12 (CYANOCOBALAMIN ) 100 MCG tablet      No current facility-administered medications for this visit.    Allergies-reviewed and updated Allergies  Allergen Reactions   Lisinopril      Swelling of tongue   Moxifloxacin     Achilles tendon rupture potentially   Quinolones     Partial achilles rupture on this in past   Pioglitazone     REACTION: weight gain & ankle edema    Social History   Social History Narrative   Married 1976. 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 Oropharynx normal Neck: no thyromegaly CV: RRR no murmurs rubs or gallops Lungs: CTAB no crackles, wheeze, rhonchi Abdomen: soft/nontender/nondistended/normal bowel sounds. No rebound or guarding.  Ext: no edema Skin: warm, dry Neuro: grossly normal, moves all extremities, PERRLA   Assessment and Plan   73 y.o. female presenting for annual physical.  Health Maintenance counseling: 1. Anticipatory guidance: Patient counseled regarding regular dental exams -q6 months, eye exams - yearly,  avoiding smoking and second hand smoke , limiting alcohol to 1 beverage per day- maybe 1-2 a week glass of wine , no illicit drugs .   2. Risk factor reduction:  Advised patient of need for regular exercise and diet rich and fruits and  vegetables to reduce risk of heart attack and stroke.  Exercise- excellent in pool 10 hours a week.  Diet/weight management-weight down another pound from last visit-had been down 4 pounds from  visit before that.  Wt Readings from Last 3 Encounters:  12/31/23 167 lb 9.6 oz (76 kg)  08/05/23 168 lb (76.2 kg)  12/07/22 172 lb (78 kg)   3. Immunizations/screenings/ancillary studies-plans Tdap at pharmacy, has already done flu.  Likely opting out of COVID Immunization History  Administered Date(s) Administered   Fluad Quad(high Dose 65+) 12/15/2019, 12/26/2020, 12/22/2021, 12/17/2023   Fluad Trivalent(High Dose 65+) 12/07/2022   INFLUENZA, HIGH DOSE SEASONAL PF 01/13/2016, 12/27/2016, 12/10/2017   Influenza Split 02/22/2011, 12/03/2011   Influenza Whole 01/15/2007, 12/17/2007, 03/03/2008, 12/07/2009   Influenza, Quadrivalent, Recombinant, Inj, Pf 11/06/2018   Influenza,inj,Quad PF,6+ Mos 01/20/2013   Influenza-Unspecified 12/30/2014, 11/06/2018   PFIZER(Purple Top)SARS-COV-2 Vaccination 04/04/2019, 04/22/2019, 12/15/2019   Pneumococcal Conjugate-13 02/21/2015   Pneumococcal Polysaccharide-23 01/24/2017   Td 05/21/2002   Tdap 02/11/2013   Zoster Recombinant(Shingrix) 02/12/2017, 06/03/2017   Zoster, Live 01/05/2011  4. Cervical cancer screening- past age based screening recommendations . No dischrage or blood 5. Breast cancer screening-  breast exam - self exams- and mammogram - 12/12/23- prior biopsy but did well this year 6. Colon cancer screening - 6.19/2024 with 5 year repeat 7. Skin cancer screening- Dr. Rolan molt as needed. advised regular sunscreen use. Denies worrisome, changing, or new skin lesions.  8. Birth control/STD check- monogamous/postemnopausal 9. Osteoporosis screening at 43- normal bone density age 67 or 20. In 2021- we had discussed 5-10 year repeat 10. Smoking associated screening - never smoker  Status of chronic or acute concerns   # Diabetes-diagnosis 2002 with  a1c over 12 S: Medication: Januvia  100Mg  daily, Metformin  1000Mg  - 1g AM, 500mg  lunch, 1 g PM, glipizide  10Mg  extended release twice daily -have considered Rybelsus  as doesn't tolerate injections but may not give big drop  CBGs- 7 day average 146, 14 day 149, 30 day 149 Exercise and diet- excellent in pool still 10 hours a week Lab Results  Component Value Date   HGBA1C 7.1 (A) 08/05/2023   HGBA1C 7.3 (H) 12/07/2022   HGBA1C 7.5 (H) 08/01/2022  A/P: hopefully stable or improved- update a1c today. Continue current meds for now   # Microalbuminuria-as high as 694 but with dietary changes and hydration improved to 162.  Initial test may have been lab error.  Angioedema on lisinopril  limits options- worried about jardiance . On Kerendia .   # Diabetic retinopathy-close follow-up with ophthalmology.  Just seen in August and stable report  #hypertension with whitecoat element S: medication: Amlodipine  5 mg twice daily, carvedilol  6.25mg  twice daily A/P: stable- continue current medicines   # History of cerebrovascular accident- blurry vision when a1c was high at diagnosis in 2002 and resolved within 6 weeks- we will try to find MRI  #hyperlipidemia  S: Medication: atorvastatin  20Mg  5 times a week, aspirin 81 due to stroke history  -reassuring nmr lipoprofile from 2005 with Dr. Tish Lab Results  Component Value Date   CHOL 175 08/05/2023   HDL 74.00 08/05/2023   LDLCALC 88 08/05/2023   LDLDIRECT 60.0 01/29/2022   TRIG 66.0 08/05/2023   CHOLHDL 2 08/05/2023  A/P: close to ideal goal 70 or less- already gets some exercise intolerance from statins and had reassuring nmr lipoprofile in past in 2005- wants to hold steady  # GERD with history of impaction - on pantoprazole 20 mg (prior 40) with Dr. Rosalie.  She was just seen in August-plan was to potentially trial Prilosec 10 mg- hasn't tried yet (using what she has) and possible further wean to Pepcid  Recommended  follow up: Return in about  4 months (around 05/02/2024) for followup or sooner if needed.Schedule b4 you leave.  Lab/Order associations: NOT fasting   ICD-10-CM   1. Preventative health care  Z00.00     2. Type 2 diabetes mellitus with mild nonproliferative retinopathy without macular edema, without long-term current use of insulin, unspecified laterality (HCC)  E11.3299     3. Hyperlipidemia associated with type 2 diabetes mellitus (HCC)  E11.69    E78.5     4. Mild nonproliferative diabetic retinopathy of both eyes without macular edema associated with type 2 diabetes mellitus (HCC)  Z88.6706     5. Essential hypertension  I10       No orders of the defined types were placed in this encounter.   Return precautions advised.  Garnette Lukes, MD

## 2024-01-18 ENCOUNTER — Other Ambulatory Visit: Payer: Self-pay | Admitting: Family Medicine

## 2024-03-18 ENCOUNTER — Other Ambulatory Visit: Payer: Self-pay | Admitting: Family Medicine

## 2024-03-31 ENCOUNTER — Other Ambulatory Visit: Payer: Self-pay | Admitting: Family Medicine

## 2024-03-31 MED ORDER — BLOOD GLUCOSE TEST VI STRP: 1.0000 | ORAL_STRIP | 0 refills | Status: DC

## 2024-03-31 MED ORDER — BLOOD GLUCOSE MONITORING SUPPL DEVI: 1.0000 | 0 refills | Status: AC

## 2024-03-31 MED ORDER — LANCETS MISC: 1.0000 | 0 refills | Status: AC

## 2024-03-31 MED ORDER — LANCET DEVICE MISC: 1.0000 | 0 refills | Status: AC

## 2024-04-15 ENCOUNTER — Other Ambulatory Visit: Payer: Self-pay | Admitting: Family Medicine

## 2024-04-17 ENCOUNTER — Other Ambulatory Visit: Payer: Self-pay | Admitting: Family Medicine

## 2024-04-22 ENCOUNTER — Other Ambulatory Visit: Payer: Self-pay | Admitting: Family Medicine

## 2024-05-11 ENCOUNTER — Ambulatory Visit: Admitting: Family Medicine
